# Patient Record
Sex: Female | Born: 1935 | Race: White | Hispanic: No | Marital: Single | Smoking: Never smoker
Health system: Southern US, Community
[De-identification: ages and names within clinical notes are randomized; demographics above are authoritative.]

## PROBLEM LIST (undated history)

## (undated) DIAGNOSIS — I82409 Acute embolism and thrombosis of unspecified deep veins of unspecified lower extremity: Secondary | ICD-10-CM

## (undated) DIAGNOSIS — I2699 Other pulmonary embolism without acute cor pulmonale: Secondary | ICD-10-CM

## (undated) DIAGNOSIS — J45909 Unspecified asthma, uncomplicated: Secondary | ICD-10-CM

## (undated) DIAGNOSIS — K769 Liver disease, unspecified: Secondary | ICD-10-CM

## (undated) DIAGNOSIS — R011 Cardiac murmur, unspecified: Secondary | ICD-10-CM

## (undated) DIAGNOSIS — C679 Malignant neoplasm of bladder, unspecified: Secondary | ICD-10-CM

## (undated) DIAGNOSIS — I10 Essential (primary) hypertension: Secondary | ICD-10-CM

## (undated) DIAGNOSIS — C3490 Malignant neoplasm of unspecified part of unspecified bronchus or lung: Secondary | ICD-10-CM

## (undated) DIAGNOSIS — N289 Disorder of kidney and ureter, unspecified: Secondary | ICD-10-CM

## (undated) DIAGNOSIS — N809 Endometriosis, unspecified: Secondary | ICD-10-CM

## (undated) DIAGNOSIS — K529 Noninfective gastroenteritis and colitis, unspecified: Secondary | ICD-10-CM

## (undated) HISTORY — DX: Cardiac murmur, unspecified: R01.1

## (undated) HISTORY — DX: Liver disease, unspecified: K76.9

## (undated) HISTORY — DX: Endometriosis, unspecified: N80.9

## (undated) HISTORY — DX: Noninfective gastroenteritis and colitis, unspecified: K52.9

## (undated) HISTORY — DX: Disorder of kidney and ureter, unspecified: N28.9

## (undated) HISTORY — PX: SMALL INTESTINE SURGERY: SHX150

## (undated) HISTORY — PX: OTHER SURGICAL HISTORY: SHX169

## (undated) HISTORY — PX: LUNG REMOVAL, PARTIAL: SHX233

## (undated) HISTORY — DX: Essential (primary) hypertension: I10

## (undated) HISTORY — DX: Malignant neoplasm of bladder, unspecified: C67.9

## (undated) HISTORY — DX: Malignant neoplasm of unspecified part of unspecified bronchus or lung: C34.90

## (undated) HISTORY — PX: ABDOMINAL HYSTERECTOMY: SHX81

## (undated) HISTORY — DX: Unspecified asthma, uncomplicated: J45.909

## (undated) HISTORY — DX: Other pulmonary embolism without acute cor pulmonale: I26.99

## (undated) HISTORY — DX: Acute embolism and thrombosis of unspecified deep veins of unspecified lower extremity: I82.409

---

## 2004-03-08 ENCOUNTER — Ambulatory Visit: Payer: Self-pay | Admitting: Internal Medicine

## 2004-03-13 ENCOUNTER — Ambulatory Visit: Payer: Self-pay | Admitting: Internal Medicine

## 2005-05-06 ENCOUNTER — Ambulatory Visit: Payer: Self-pay

## 2005-07-02 ENCOUNTER — Ambulatory Visit: Payer: Self-pay | Admitting: Internal Medicine

## 2005-08-29 ENCOUNTER — Emergency Department: Payer: Self-pay | Admitting: Emergency Medicine

## 2005-10-06 ENCOUNTER — Emergency Department: Payer: Self-pay | Admitting: Emergency Medicine

## 2006-02-28 ENCOUNTER — Ambulatory Visit: Payer: Self-pay | Admitting: Internal Medicine

## 2006-06-27 ENCOUNTER — Ambulatory Visit: Payer: Self-pay

## 2006-07-08 ENCOUNTER — Ambulatory Visit: Payer: Self-pay | Admitting: Internal Medicine

## 2006-08-01 ENCOUNTER — Emergency Department: Payer: Self-pay | Admitting: Emergency Medicine

## 2006-08-12 ENCOUNTER — Encounter: Admission: RE | Admit: 2006-08-12 | Discharge: 2006-08-12 | Payer: Self-pay | Admitting: Unknown Physician Specialty

## 2006-09-03 ENCOUNTER — Ambulatory Visit: Payer: Self-pay | Admitting: Otolaryngology

## 2007-07-10 ENCOUNTER — Ambulatory Visit: Payer: Self-pay | Admitting: Internal Medicine

## 2008-07-08 ENCOUNTER — Ambulatory Visit: Payer: Self-pay | Admitting: Physician Assistant

## 2008-07-11 ENCOUNTER — Ambulatory Visit: Payer: Self-pay | Admitting: Internal Medicine

## 2009-07-31 ENCOUNTER — Ambulatory Visit: Payer: Self-pay | Admitting: Internal Medicine

## 2010-01-18 ENCOUNTER — Ambulatory Visit: Payer: Self-pay | Admitting: Otolaryngology

## 2010-08-02 ENCOUNTER — Ambulatory Visit: Payer: Self-pay | Admitting: Internal Medicine

## 2010-11-22 ENCOUNTER — Encounter: Payer: Self-pay | Admitting: Podiatry

## 2010-12-10 ENCOUNTER — Encounter: Payer: Self-pay | Admitting: Podiatry

## 2011-01-10 ENCOUNTER — Encounter: Payer: Self-pay | Admitting: Podiatry

## 2011-02-09 ENCOUNTER — Encounter: Payer: Self-pay | Admitting: Podiatry

## 2011-08-05 ENCOUNTER — Ambulatory Visit: Payer: Self-pay | Admitting: Internal Medicine

## 2011-10-10 ENCOUNTER — Encounter: Payer: Self-pay | Admitting: Podiatry

## 2012-08-05 ENCOUNTER — Ambulatory Visit: Payer: Self-pay | Admitting: Internal Medicine

## 2012-11-11 ENCOUNTER — Ambulatory Visit: Payer: Self-pay | Admitting: Internal Medicine

## 2013-08-19 ENCOUNTER — Other Ambulatory Visit: Payer: Self-pay | Admitting: Neurology

## 2013-08-19 DIAGNOSIS — R51 Headache: Principal | ICD-10-CM

## 2013-08-19 DIAGNOSIS — R519 Headache, unspecified: Secondary | ICD-10-CM

## 2013-08-23 ENCOUNTER — Ambulatory Visit
Admission: RE | Admit: 2013-08-23 | Discharge: 2013-08-23 | Disposition: A | Discharge status: Home / Self Care | Payer: Medicare Other | Source: Ambulatory Visit | Attending: Neurology | Admitting: Neurology

## 2013-08-23 DIAGNOSIS — R519 Headache, unspecified: Secondary | ICD-10-CM

## 2013-08-23 DIAGNOSIS — R51 Headache: Principal | ICD-10-CM

## 2014-02-17 ENCOUNTER — Ambulatory Visit: Payer: Self-pay | Admitting: Otolaryngology

## 2015-01-09 ENCOUNTER — Encounter: Payer: Self-pay | Admitting: Urology

## 2015-01-09 ENCOUNTER — Ambulatory Visit (INDEPENDENT_AMBULATORY_CARE_PROVIDER_SITE_OTHER): Payer: Medicare Other | Admitting: Urology

## 2015-01-09 VITALS — BP 130/75 | HR 69 | Ht 61.0 in | Wt 193.4 lb

## 2015-01-09 DIAGNOSIS — N390 Urinary tract infection, site not specified: Secondary | ICD-10-CM | POA: Diagnosis not present

## 2015-01-09 DIAGNOSIS — N952 Postmenopausal atrophic vaginitis: Secondary | ICD-10-CM | POA: Diagnosis not present

## 2015-01-09 LAB — URINALYSIS, COMPLETE
BILIRUBIN UA: NEGATIVE
Glucose, UA: NEGATIVE
Ketones, UA: NEGATIVE
Nitrite, UA: POSITIVE — AB
PH UA: 6 (ref 5.0–7.5)
PROTEIN UA: NEGATIVE
SPEC GRAV UA: 1.015 (ref 1.005–1.030)
Urobilinogen, Ur: 0.2 mg/dL (ref 0.2–1.0)

## 2015-01-09 LAB — MICROSCOPIC EXAMINATION: Renal Epithel, UA: NONE SEEN /hpf

## 2015-01-09 LAB — BLADDER SCAN AMB NON-IMAGING: Scan Result: 13

## 2015-01-09 NOTE — Progress Notes (Signed)
01/09/2015 2:46 PM   Jane Perez 1931-02-07 [id number]  Referring provider: No referring provider defined for this encounter.  Chief Complaint  Patient presents with  . Recurrent UTI    referred by Dr. Vickii Penna GI Dr from [place] old patient of Madelin Headings    HPI: Patient is an 81 year old white female with history of recurrent urinary tract infections who presents today referred by her GI physician, Dr. Vickii Penna.    Her baseline urinary symptoms are nocturia.  She has chronic diarrhea and a history of C. Diff infections as well.  I have reviewed Dr. Hillary Bow records and found one urine culture from 10/17/2014 which noted mixed urogenital flora.    Her UTI symptoms are a foul smell to her urine.  She is not experiencing dysuria, cognitive changes, fevers or chills, nausea or vomiting.  She has not had any gross hematuria.    She has a history of bladder cancer diagnosed in the 46's.  I do not know what the pathology of the tumor.  She has several urine cytologies over the years, which have been negative for malignancies.    Her UA was nitrate positive, with RBC's, bacteria and WBC's.  She states she has good fluid intake, but she is allergic to cranberry juice.     PMH: Past Medical History  Diagnosis Date  . Bladder cancer ([place])   . Kidney disease   . Endometriosis   . Colitis   . Asthma   . DVT (deep venous thrombosis) ([place])   . Pulmonary thrombosis ([place])   . Heart murmur   . HTN (hypertension)   . Liver disease     Surgical History: Past Surgical History  Procedure Laterality Date  . Abdominal hysterectomy    . Endometrial surgery    . Small intestine surgery      x 2  . Lung removal, partial Right   . Bladder cancer surgery      Home Medications:    Medication List       This list is accurate as of: 01/09/15  2:46 PM.  Always use your most recent med list.               ALIGN 4 MG Caps  Take by mouth.     atenolol 25 MG tablet  Commonly known as:   TENORMIN  TAKE ONE TABLET EVERY DAY     clotrimazole-betamethasone cream  Commonly known as:  LOTRISONE  Apply topically.     fluconazole 100 MG tablet  Commonly known as:  DIFLUCAN     MULTI-VITAMINS Tabs  Take by mouth.     nitrofurantoin (macrocrystal-monohydrate) 100 MG capsule  Commonly known as:  MACROBID     RA VITAMIN B-12 TR 1000 MCG Tbcr  Generic drug:  Cyanocobalamin  Take by mouth.     vitamin C 1000 MG tablet  Take by mouth.     vitamin E 1000 UNIT capsule  Take by mouth.        Allergies:  Allergies  Allergen Reactions  . Aspirin Other (See Comments)    Will cause hemerage   . Ciprofloxacin Hives  . Codeine Other (See Comments)    Other Reaction: GI Upset  . Metronidazole Hives  . Penicillins Shortness Of Breath  . Sulfa Antibiotics Shortness Of Breath  . Clindamycin Hcl Other (See Comments)  . Nitrofurantoin Monohyd Macro Other (See Comments) and Nausea Only  . Other Other (See Comments)    Uncoded Allergy. Allergen: flu  shot, Other Reaction: Other reaction  . Povidone Iodine Other (See Comments)    Other Reaction: Other reaction    Family History: No family history on file.  Social History:  reports that she has never smoked. She does not have any smokeless tobacco history on file. She reports that she does not drink alcohol or use illicit drugs.  ROS: UROLOGY Frequent Urination?: No Hard to postpone urination?: No Burning/pain with urination?: No Get up at night to urinate?: Yes Leakage of urine?: No Urine stream starts and stops?: No Trouble starting stream?: No Do you have to strain to urinate?: No Blood in urine?: No Urinary tract infection?: Yes Sexually transmitted disease?: No Injury to kidneys or bladder?: No Painful intercourse?: No Weak stream?: No Currently pregnant?: No Vaginal bleeding?: No Last menstrual period?: n  Gastrointestinal Nausea?: No Vomiting?: No Indigestion/heartburn?: No Diarrhea?:  Yes Constipation?: No  Constitutional Fever: No Night sweats?: No Weight loss?: No Fatigue?: No  Skin Skin rash/lesions?: No Itching?: No  Eyes Blurred vision?: No Double vision?: No  Ears/Nose/Throat Sore throat?: No Sinus problems?: No  Hematologic/Lymphatic Swollen glands?: Yes Easy bruising?: Yes  Cardiovascular Leg swelling?: No Chest pain?: No  Respiratory Cough?: No Shortness of breath?: No  Endocrine Excessive thirst?: No  Musculoskeletal Back pain?: No Joint pain?: No  Neurological Headaches?: No Dizziness?: No  Psychologic Depression?: No Anxiety?: No  Physical Exam: BP 130/75 mmHg  Pulse 69  Ht '5\' 1"'$  (1.549 m)  Wt 193 lb 6.4 oz (87.726 kg)  BMI 36.56 kg/m2  Constitutional:  Alert and oriented, No acute distress. HEENT: [place] AT, moist mucus membranes.  Trachea midline, no masses. Cardiovascular: No clubbing, cyanosis, or edema. Respiratory: Normal respiratory effort, no increased work of breathing. GI: Abdomen is soft, non tender, non distended, no abdominal masses GU: No CVA tenderness. Patient deferred exam today because she did not have a bath.   Skin: No rashes, bruises or suspicious lesions. Lymph: No cervical or inguinal adenopathy. Neurologic: Grossly intact, no focal deficits, moving all 4 extremities. Psychiatric: Normal mood and affect.  Laboratory Data:  Urinalysis Results for orders placed or performed in visit on 01/09/15  Microscopic Examination  Result Value Ref Range   WBC, UA 11-30 (A) 0 -  5 /hpf   RBC, UA 3-10 (A) 0 -  2 /hpf   Epithelial Cells (non renal) 0-10 0 - 10 /hpf   Renal Epithel, UA None seen None seen /hpf   Bacteria, UA Many (A) None seen/Few  Urinalysis, Complete  Result Value Ref Range   Specific Gravity, UA 1.015 1.005 - 1.030   pH, UA 6.0 5.0 - 7.5   Color, UA Yellow Yellow   Appearance Ur Clear Clear   Leukocytes, UA 2+ (A) Negative   Protein, UA Negative Negative/Trace   Glucose, UA  Negative Negative   Ketones, UA Negative Negative   RBC, UA 1+ (A) Negative   Bilirubin, UA Negative Negative   Urobilinogen, Ur 0.2 0.2 - 1.0 mg/dL   Nitrite, UA Positive (A) Negative   Microscopic Examination See below:   BLADDER SCAN AMB NON-IMAGING  Result Value Ref Range   Scan Result 13    Pertinent Imaging: Results for DARIENNE, BELLEAU (MRN [id number]) as of 01/09/2015 14:24  Ref. Range 01/09/2015 14:19  Scan Result Unknown 13    Assessment & Plan:    1. Recurrent UTI:    Patient most likely is colonized.  I will send the urine for culture, but I will hold  antibiotics for now.  If she should develop symptoms, such as: fevers, gross hematuria, mental status changes, etc., we will treat.  She is incontinent of stool, so this predisposes her to UTI's.    - Urinalysis, Complete - BLADDER SCAN AMB NON-IMAGING  2. Atrophic vaginitis:   Patient most likely has atrophic vaginitis.  She would not let me examine her today, as she did not take a bath.  She will allow an exam in three months when she RTC.  Patient was given a sample of vaginal estrogen cream and instructed to apply 0.'5mg'$  (pea-sized amount)  just inside the vaginal introitus with a finger-tip every night for two weeks and then Monday, Wednesday and Friday nights.  I explained to the patient that vaginally administered estrogen, which causes only a slight increase in the blood estrogen levels, have fewer contraindications and adverse systemic effects that oral HT.  Return in about 3 months (around 04/11/2015) for exam .  Zara Council, [facility]  [facility] [address], [place], [place] [address] [phone]

## 2015-04-11 ENCOUNTER — Encounter: Payer: Self-pay | Admitting: Urology

## 2015-04-11 ENCOUNTER — Ambulatory Visit (INDEPENDENT_AMBULATORY_CARE_PROVIDER_SITE_OTHER): Payer: Medicare Other | Admitting: Urology

## 2015-04-11 VITALS — BP 144/76 | HR 81 | Ht 62.0 in | Wt 196.5 lb

## 2015-04-11 DIAGNOSIS — N390 Urinary tract infection, site not specified: Secondary | ICD-10-CM

## 2015-04-11 DIAGNOSIS — Z8551 Personal history of malignant neoplasm of bladder: Secondary | ICD-10-CM

## 2015-04-11 DIAGNOSIS — N952 Postmenopausal atrophic vaginitis: Secondary | ICD-10-CM

## 2015-04-11 LAB — URINALYSIS, COMPLETE
BILIRUBIN UA: NEGATIVE
Glucose, UA: NEGATIVE
KETONES UA: NEGATIVE
Nitrite, UA: NEGATIVE
PH UA: 6.5 (ref 5.0–7.5)
PROTEIN UA: NEGATIVE
RBC UA: NEGATIVE
SPEC GRAV UA: 1.01 (ref 1.005–1.030)
Urobilinogen, Ur: 0.2 mg/dL (ref 0.2–1.0)

## 2015-04-11 LAB — MICROSCOPIC EXAMINATION
BACTERIA UA: NONE SEEN
EPITHELIAL CELLS (NON RENAL): NONE SEEN /HPF (ref 0–10)
RBC, UA: NONE SEEN /hpf (ref 0–?)

## 2015-04-11 NOTE — Progress Notes (Signed)
1:42 PM   Jane Perez 1930/03/18 [id number]  Referring provider: Tracie Harrier, MD [address] [facility] [place], [place] [address]  Chief Complaint  Patient presents with  . Vaginitis    follow up exam    HPI: Patient is an 82 year old white female with history of recurrent urinary tract infections who presents today for an exam and to discuss her vaginal estrogen cream use.    Previous history Her baseline urinary symptoms are nocturia.  She has chronic diarrhea and a history of C. Diff infections as well.  I have reviewed Dr. Hillary Bow records and found one urine culture from 10/17/2014 which noted mixed urogenital flora.  Her UTI symptoms are a foul smell to her urine.  She is not experiencing dysuria, cognitive changes, fevers or chills, nausea or vomiting.  She has not had any gross hematuria.   She has a history of bladder cancer diagnosed in the 26's.  I do not know what the pathology of the tumor.  She has several urine cytologies over the years, which have been negative for malignancies.    She states that she did not use the cream and is not having any problems at this time.  Her UA was unremarkable on today's exam.  She states she has good fluid intake, but she is allergic to cranberry juice.  She is refusing her exam at today's visit.    She denies any gross hematuria, dysuria or suprapubic pain. She is not any recent fevers, chills, nausea or vomiting.   PMH: Past Medical History  Diagnosis Date  . Bladder cancer ([place])   . Kidney disease   . Endometriosis   . Colitis   . Asthma   . DVT (deep venous thrombosis) ([place])   . Pulmonary thrombosis ([place])   . Heart murmur   . HTN (hypertension)   . Liver disease   . Lung cancer [facility])     Surgical History: Past Surgical History  Procedure Laterality Date  . Abdominal hysterectomy    . Endometrial surgery    . Small intestine surgery      x 2  . Lung removal, partial Right   . Bladder  cancer surgery      Home Medications:    Medication List       This list is accurate as of: 04/11/15  1:42 PM.  Always use your most recent med list.               ALIGN 4 MG Caps  Take by mouth.     atenolol 25 MG tablet  Commonly known as:  TENORMIN  TAKE ONE TABLET EVERY DAY     clotrimazole-betamethasone cream  Commonly known as:  LOTRISONE  Apply topically.     fluconazole 100 MG tablet  Commonly known as:  DIFLUCAN  Reported on 04/11/2015     ipratropium-albuterol 0.5-2.5 (3) MG/3ML Soln  Commonly known as:  DUONEB  Inhale into the lungs. Reported on 04/11/2015     MULTI-VITAMINS Tabs  Take by mouth.     nitrofurantoin (macrocrystal-monohydrate) 100 MG capsule  Commonly known as:  MACROBID  Reported on 04/11/2015     RA VITAMIN B-12 TR 1000 MCG Tbcr  Generic drug:  Cyanocobalamin  Take by mouth.     vitamin C 1000 MG tablet  Take by mouth.     vitamin E 1000 UNIT capsule  Take by mouth.        Allergies:  Allergies  Allergen Reactions  . Aspirin Other (See Comments)    Will cause hemerage   . Ciprofloxacin Hives  . Codeine Other (See Comments)    Other Reaction: GI Upset  . Metronidazole Hives  . Penicillins Shortness Of Breath  . Sulfa Antibiotics Shortness Of Breath  . Clindamycin Hcl Other (See Comments)  . Nitrofurantoin Monohyd Macro Other (See Comments) and Nausea Only  . Other Other (See Comments)    Uncoded Allergy. Allergen: flu shot, Other Reaction: Other reaction  . Povidone Iodine Other (See Comments)    Other Reaction: Other reaction    Family History: Family History  Problem Relation Age of Onset  . Kidney disease Brother   . Bladder Cancer Neg Hx   . Kidney disease Mother   . Lung cancer Father   . Colon cancer Mother   . Hypertension      multiple family members    Social History:  reports that she has never smoked. She does not have any smokeless tobacco history on file. She reports that she does not drink alcohol  or use illicit drugs.  ROS: UROLOGY Frequent Urination?: No Hard to postpone urination?: No Burning/pain with urination?: No Get up at night to urinate?: No Leakage of urine?: No Urine stream starts and stops?: No Trouble starting stream?: No Do you have to strain to urinate?: No Blood in urine?: No Urinary tract infection?: No Sexually transmitted disease?: No Injury to kidneys or bladder?: No Painful intercourse?: No Weak stream?: No Currently pregnant?: No Vaginal bleeding?: No Last menstrual period?: n  Gastrointestinal Nausea?: No Vomiting?: No Indigestion/heartburn?: No Diarrhea?: No Constipation?: No  Constitutional Fever: No Night sweats?: No Weight loss?: No Fatigue?: No  Skin Skin rash/lesions?: No Itching?: No  Eyes Blurred vision?: No Double vision?: No  Ears/Nose/Throat Sore throat?: No Sinus problems?: No  Hematologic/Lymphatic Swollen glands?: No Easy bruising?: No  Cardiovascular Leg swelling?: No Chest pain?: No  Respiratory Cough?: No Shortness of breath?: No  Endocrine Excessive thirst?: No  Musculoskeletal Back pain?: No Joint pain?: No  Neurological Headaches?: No Dizziness?: No  Psychologic Depression?: No Anxiety?: No  Physical Exam: BP 144/76 mmHg  Pulse 81  Ht '5\' 2"'$  (1.575 m)  Wt 196 lb 8 oz (89.132 kg)  BMI 35.93 kg/m2  Constitutional:  Alert and oriented, No acute distress. HEENT: [place] AT, moist mucus membranes.  Trachea midline, no masses. Cardiovascular: No clubbing, cyanosis, or edema. Respiratory: Normal respiratory effort, no increased work of breathing. GI: Abdomen is soft, non tender, non distended, no abdominal masses GU: No CVA tenderness. Patient deferred exam today because she did not have a bath.   Skin: No rashes, bruises or suspicious lesions. Lymph: No cervical or inguinal adenopathy. Neurologic: Grossly intact, no focal deficits, moving all 4 extremities. Psychiatric: Normal mood and  affect.  Laboratory Data:  Urinalysis Results for orders placed or performed in visit on 04/11/15  Microscopic Examination  Result Value Ref Range   WBC, UA 0-5 0 -  5 /hpf   RBC, UA None seen 0 -  2 /hpf   Epithelial Cells (non renal) None seen 0 - 10 /hpf   Bacteria, UA None seen None seen/Few  Urinalysis, Complete  Result Value Ref Range   Specific Gravity, UA 1.010 1.005 - 1.030   pH, UA 6.5 5.0 - 7.5   Color, UA Yellow Yellow   Appearance Ur Clear Clear   Leukocytes, UA Trace (A) Negative   Protein, UA Negative Negative/Trace   Glucose, UA  Negative Negative   Ketones, UA Negative Negative   RBC, UA Negative Negative   Bilirubin, UA Negative Negative   Urobilinogen, Ur 0.2 0.2 - 1.0 mg/dL   Nitrite, UA Negative Negative   Microscopic Examination See below:       Assessment & Plan:    1. Recurrent UTI:    Patient is not having urinary symptoms at this time. Her UA today is unremarkable.  - Urinalysis, Complete   2. Atrophic vaginitis:   Patient did not use the vaginal estrogen cream and she felt she did not have any problems.  She refused exam today.  3. Remote history of bladder cancer:   Patient states that she had bladder cancer in the 1980's. She had microscopic hematuria at her last visit, but she states that she was having difficulty with hemorrhoids and diarrhea. She would not allow a exam at that time so I was uncertain where the blood in the urine would be coming from. On today's urinalysis,  no blood is seen.  She will follow-up in one year for urinalysis. She will contact the office if she develops any gross hematuria.   Return for pending UA.  Zara Council, [facility] [address], [place], [place] [address] [phone]

## 2015-04-12 ENCOUNTER — Telehealth: Payer: Self-pay | Admitting: Urology

## 2015-04-12 DIAGNOSIS — Z8551 Personal history of malignant neoplasm of bladder: Secondary | ICD-10-CM | POA: Insufficient documentation

## 2015-04-12 NOTE — Telephone Encounter (Signed)
Patient did not have blood in her urine.  We need to see her in one year.

## 2015-04-12 NOTE — Telephone Encounter (Signed)
Spoke with pt in reference to hematuria. Pt voiced understanding.

## 2020-02-17 ENCOUNTER — Telehealth: Payer: Self-pay | Admitting: Adult Health Nurse Practitioner

## 2020-02-17 NOTE — Telephone Encounter (Signed)
Spoke with patient regarding Palliative referral and have scheduled an In-home Consult for 02/29/20 @ 10 AM.

## 2020-02-29 ENCOUNTER — Telehealth: Payer: Self-pay | Admitting: Adult Health Nurse Practitioner

## 2020-02-29 ENCOUNTER — Other Ambulatory Visit: Payer: Self-pay

## 2020-02-29 ENCOUNTER — Other Ambulatory Visit: Payer: Medicare Other | Admitting: Adult Health Nurse Practitioner

## 2020-02-29 NOTE — Telephone Encounter (Signed)
Spoke with patient to see if she wanted to reschedule the Palliative Consult that was scheduled for today at 10.  Patient stated that she forgot about the appointment and wasn't home.  We have rescheduled the visit for 03/08/20 @ 12:30 PM.

## 2020-02-29 NOTE — Telephone Encounter (Signed)
Arrived at patient's home for visit.  No answer at door and no answer when called on phone.  Unable to leave message.  Attempted to call son with no answer and unable to leave message.  Reached to scheduler to let her know. Calleigh Lafontant K. Olena Heckle NP

## 2020-03-08 ENCOUNTER — Other Ambulatory Visit: Payer: Medicare Other | Admitting: Adult Health Nurse Practitioner

## 2020-03-22 ENCOUNTER — Other Ambulatory Visit: Payer: Medicare Other | Admitting: Adult Health Nurse Practitioner

## 2020-03-22 ENCOUNTER — Other Ambulatory Visit: Payer: Self-pay

## 2020-03-22 DIAGNOSIS — R5381 Other malaise: Secondary | ICD-10-CM

## 2020-03-22 DIAGNOSIS — G629 Polyneuropathy, unspecified: Secondary | ICD-10-CM

## 2020-03-22 DIAGNOSIS — Z515 Encounter for palliative care: Secondary | ICD-10-CM

## 2020-03-22 DIAGNOSIS — G43909 Migraine, unspecified, not intractable, without status migrainosus: Secondary | ICD-10-CM

## 2020-03-22 DIAGNOSIS — I10 Essential (primary) hypertension: Secondary | ICD-10-CM

## 2020-03-22 NOTE — Progress Notes (Signed)
[facility] Palliative Care Consult Note Telephone: [phone]  Fax: [phone]  PATIENT NAME: BLIMA JAIMES DOB: Mar 01, 1931 MRN: [id number]  PRIMARY CARE PROVIDER:   Tracie Harrier, MD  REFERRING PROVIDER:  Tracie Harrier, [place] [facility] [place],  [place] [address]  RESPONSIBLE PARTY:   Self, [phone] Loletta Specter, son [phone]  Chief complaint:  Initial palliative visit/debility  Called patient prior to visit to confirm visit and COVID screening done.    RECOMMENDATIONS and PLAN:  1.  Advanced care planning.  Patient full code.  She does voice that she does not want to be on life support.  States that she needs to look over and fill out HCPOA.  Left advanced directives and MOST form for her to review.  Will go over further at a future visits  2. Neuropathy.  Patient gets neuropathic pain in legs that is worse at night.  She has been seen by neurology and prescribed gabapentin, which she has not tried.  Have encouraged her to try this.   3.  Hypertension.  This controlled with atenolol 25 mg daily.  4.  Migraines.  When she does get a migraine she is able to control the pain with rest, warm cloth, and extra strength Tylenol. Continue current relief measures.   5.  Debility.  Patient is having more weakness in her legs.  Does get help with aids that come in to help 2 days a week.  PCP trying to get appointment with her for [facility] face to face but she has kept cancelling due to various reasons.  Will continue to encourage to keep appointment with PCP   Palliative will continue to monitor for symptom management/decline and make recommendations as needed.  Follow up in 6 weeks. Encouraged to call with any questions or concerns.  I spent 80 minutes providing this consultation. More than 50% of the time in this consultation was spent coordinating communication  HISTORY OF PRESENT ILLNESS:  TIMICA MARCOM  is a 87 y.o. year old female with multiple medical problems including OA, asthma, CKD, colitis, migraines, h/o lung cancer with surgical resection, h/o bladder cancer requiring surgery. Palliative Care was asked to help address goals of care. Patient lives alone.  Does not see family much but has friends and church family that visit and help her with rides, getting groceries, and sometimes financially. States that she quit driving about 2 years ago.  She states that she has been slower and uses her walker more often to prevent falls.  Does state having a history of falls but does not report any recent falls.  States that some days are better than other days and tries to rest more on days she feels weaker or has a migraine.  States that she gets a migraine headache about 1-2 times per month, sometimes less often.  The migraine pain is mostly in the right side of her head behind her right eye.  She gets light sensitivity with the migraines.  The migraine will last all day and is relieved with rest, extra strength Tylenol, and warm compresses. She states having a blood clot behind right knee.  States that it itches but does not cause any pain.  States that she has had this for over 8 years.  She wears support hose but is unable to pull on TED hose due to shoulder pain.  She is able to perform ADLs independently.  She can do most IADLs herself but  does need help with making the bed, vacuuming, and with someone to help with getting her groceries.  Rest of 10 ROS asked and negative.  CODE STATUS: see above  PPS: 60% HOSPICE ELIGIBILITY/DIAGNOSIS: TBD  PHYSICAL EXAM:  BP 124/60  HR 62 O2 97% on RA General: NAD, frail appearing Eyes: sclera anicteric and noninjected with no discharge noted ENMT: moist mucous membranes Cardiovascular: regular rate and rhythm Pulmonary: lung sounds clear; normal respiratory effort Abdomen: soft, nontender, + bowel sounds Extremities: no edema, no joint deformities Skin: no  rashes on exposed skin Neurological: Weakness but otherwise nonfocal  Family History:  Family History  Problem Relation Age of Onset  . Colon cancer Mother  . Lung cancer Father  . Lung cancer Brother  . Blindness Neg Hx  . Diabetes type II Neg Hx  . Glaucoma Neg Hx  . High blood pressure (Hypertension) Neg Hx  . Macular degeneration Neg Hx  . Anesthesia problems Neg Hx  . Malignant hypertension Neg Hx      PAST MEDICAL HISTORY:  Past Medical History:  Diagnosis Date  . Asthma   . Bladder cancer ([place])   . Colitis   . DVT (deep venous thrombosis) ([place])   . Endometriosis   . Heart murmur   . HTN (hypertension)   . Kidney disease   . Liver disease   . Lung cancer ([place])   . Pulmonary thrombosis ([facility])     SOCIAL HX:  Social History   Tobacco Use  . Smoking status: Never Smoker  . Smokeless tobacco: Not on file  Substance Use Topics  . Alcohol use: No    Alcohol/week: 0.0 standard drinks    ALLERGIES:  Allergies  Allergen Reactions  . Aspirin Other (See Comments)    Will cause hemerage   . Ciprofloxacin Hives  . Codeine Other (See Comments)    Other Reaction: GI Upset  . Metronidazole Hives  . Penicillins Shortness Of Breath  . Sulfa Antibiotics Shortness Of Breath  . Clindamycin Hcl Other (See Comments)  . Nitrofurantoin Monohyd Macro Other (See Comments) and Nausea Only  . Other Other (See Comments)    Uncoded Allergy. Allergen: flu shot, Other Reaction: Other reaction  . Povidone Iodine Other (See Comments)    Other Reaction: Other reaction     PERTINENT MEDICATIONS:  Outpatient Encounter Medications as of 03/22/2020  Medication Sig  . Ascorbic Acid (VITAMIN C) 1000 MG tablet Take by mouth.  Marland Kitchen atenolol (TENORMIN) 25 MG tablet TAKE ONE TABLET EVERY DAY  . Cyanocobalamin (RA VITAMIN B-12 TR) 1000 MCG TBCR Take by mouth.  . fluconazole (DIFLUCAN) 100 MG tablet Reported on 04/11/2015  . ipratropium-albuterol (DUONEB) 0.5-2.5 (3) MG/3ML SOLN Inhale into  the lungs. Reported on 04/11/2015  . Multiple Vitamin (MULTI-VITAMINS) TABS Take by mouth.  . nitrofurantoin, macrocrystal-monohydrate, (MACROBID) 100 MG capsule Reported on 04/11/2015  . Probiotic Product (ALIGN) 4 MG CAPS Take by mouth.  . vitamin E 1000 UNIT capsule Take by mouth.   No facility-administered encounter medications on file as of 03/22/2020.     Cortnee Steinmiller Jenetta Downer, NP

## 2020-04-06 ENCOUNTER — Telehealth: Payer: Self-pay | Admitting: Adult Health Nurse Practitioner

## 2020-04-06 NOTE — Telephone Encounter (Signed)
Returned patient's VM.  Patient states not feeling well but has no specific complaints.  States just needing to talk with someone.  Have reached out to RN navigator to see if we have anyone who could give her periodic phone calls to have someone for her talk with as she is feeling very isolated during the pandemic and she is not able to have the vaccine due to her health issues.  Ashritha Desrosiers K. Olena Heckle NP

## 2020-05-02 ENCOUNTER — Other Ambulatory Visit: Payer: Medicare Other | Admitting: Adult Health Nurse Practitioner

## 2020-05-02 ENCOUNTER — Telehealth: Payer: Self-pay

## 2020-05-02 ENCOUNTER — Other Ambulatory Visit: Payer: Self-pay

## 2020-05-02 DIAGNOSIS — R5381 Other malaise: Secondary | ICD-10-CM

## 2020-05-02 DIAGNOSIS — I825Z1 Chronic embolism and thrombosis of unspecified deep veins of right distal lower extremity: Secondary | ICD-10-CM

## 2020-05-02 DIAGNOSIS — G43909 Migraine, unspecified, not intractable, without status migrainosus: Secondary | ICD-10-CM

## 2020-05-02 DIAGNOSIS — G629 Polyneuropathy, unspecified: Secondary | ICD-10-CM

## 2020-05-02 DIAGNOSIS — Z515 Encounter for palliative care: Secondary | ICD-10-CM

## 2020-05-02 NOTE — Progress Notes (Signed)
[place] Consult Note Telephone: [phone]  Fax: [phone]  PATIENT NAME: Jane Perez DOB: 11-06-1930 MRN: [id number]  PRIMARY CARE PROVIDER:   Tracie Harrier, MD  REFERRING PROVIDER:  Tracie Harrier, [place] [facility] [place],  [place] [address]  RESPONSIBLE PARTY:   Self, [phone] Loletta Specter, son [phone]  Chief complaint:  Initial palliative visit/debility  RECOMMENDATIONS and PLAN:  1.  Advanced care planning.  Patient is full code  2.  Neuropathy.  Patient started using her gabapentin and is getting relief with this.  Continue gabapentin at current dose and follow-up and recommendations with neurology.  3.  Migraines.  Patient gets occasional migraines which are debilitating.  Does get relief with rest and extra strength Tylenol.  Continue current relief measures  4.  Debility.  Patient able to get around home with walker and holding onto walls/furniture.  She denies shortness of breath but today she does have shortness of breath when walking from kitchen to the living room which is only about 20 to 30 feet.  We will fill out form for PCS services through [facility] and fax to them to see if patient will qualify for [facility] services.  Patient having increased weakness, DOE, forgetfulness and could use extra help in the home. Palliative continue to monitor for symptom management/decline and make recommendations as needed.  Follow-up in 6 weeks.  Encouraged to call with any questions or concerns  I spent 60 minutes providing this consultation, including time with patient, provider coordination, chart review, documentation. More than 50% of the time in this consultation was spent coordinating communication.   HISTORY OF PRESENT ILLNESS:  Jane Perez is a 87 y.o. year old female with multiple medical problems including OA, asthma, CKD, colitis, migraines, h/o lung cancer with  surgical resection, h/o bladder cancer requiring surgery. Palliative Care was asked to help address goals of care.  Patient continues to endorse weakness.  She is able to shower independently but does state that some days she just takes a sponge bath because of weakness.  Patient states her migraines do occur 1-2 times per month and she continues to get relief with rest, extra strength Tylenol and warm compresses.  Patient states that she is lonely and family does not come to see her much.  She feels as if she is becoming more forgetful.  Does state that she has forgotten once that she turned on the stove top and came back in the kitchen and found the burner red hot.  States that she has been more careful since.  Did discuss that depression and anxiety from her loneliness could cause forgetfulness.  Discussed with patient that I have reached out to her organization for help in getting someone who would just sit and talk with her.  Unable to get anybody at this time.  She is very appreciative of this effort.  CODE STATUS: see above  PPS: 60% HOSPICE ELIGIBILITY/DIAGNOSIS: TBD  PHYSICAL EXAM:  BP 122/74  HR 68 O2 94% on RA General: NAD, frail appearing Eyes: sclera anicteric and noninjected with no discharge noted ENMT: moist mucous membranes Cardiovascular: regular rate and rhythm Pulmonary: lung sounds clear; normal respiratory effort Abdomen: soft, nontender, + bowel sounds Extremities: no edema, no joint deformities Skin: no rashes on exposed skin Neurological: Weakness but otherwise nonfocal; states having forgetfulness  PAST MEDICAL HISTORY:  Past Medical History:  Diagnosis Date  . Asthma   . Bladder cancer ([place])   .  Colitis   . DVT (deep venous thrombosis) ([place])   . Endometriosis   . Heart murmur   . HTN (hypertension)   . Kidney disease   . Liver disease   . Lung cancer ([place])   . Pulmonary thrombosis ([facility])     SOCIAL HX:  Social History   Tobacco Use  . Smoking status:  Never Smoker  . Smokeless tobacco: Not on file  Substance Use Topics  . Alcohol use: No    Alcohol/week: 0.0 standard drinks    ALLERGIES:  Allergies  Allergen Reactions  . Aspirin Other (See Comments)    Will cause hemerage   . Ciprofloxacin Hives  . Codeine Other (See Comments)    Other Reaction: GI Upset  . Metronidazole Hives  . Penicillins Shortness Of Breath  . Sulfa Antibiotics Shortness Of Breath  . Clindamycin Hcl Other (See Comments)  . Nitrofurantoin Monohyd Macro Other (See Comments) and Nausea Only  . Other Other (See Comments)    Uncoded Allergy. Allergen: flu shot, Other Reaction: Other reaction  . Povidone Iodine Other (See Comments)    Other Reaction: Other reaction     PERTINENT MEDICATIONS:  Outpatient Encounter Medications as of 05/02/2020  Medication Sig  . Ascorbic Acid (VITAMIN C) 1000 MG tablet Take by mouth.  Marland Kitchen atenolol (TENORMIN) 25 MG tablet TAKE ONE TABLET EVERY DAY  . Cyanocobalamin (RA VITAMIN B-12 TR) 1000 MCG TBCR Take by mouth.  . fluconazole (DIFLUCAN) 100 MG tablet Reported on 04/11/2015  . ipratropium-albuterol (DUONEB) 0.5-2.5 (3) MG/3ML SOLN Inhale into the lungs. Reported on 04/11/2015  . Multiple Vitamin (MULTI-VITAMINS) TABS Take by mouth.  . nitrofurantoin, macrocrystal-monohydrate, (MACROBID) 100 MG capsule Reported on 04/11/2015  . Probiotic Product (ALIGN) 4 MG CAPS Take by mouth.  . vitamin E 1000 UNIT capsule Take by mouth.   No facility-administered encounter medications on file as of 05/02/2020.     Marquese Burkland Jenetta Downer, NP

## 2020-05-02 NOTE — Telephone Encounter (Signed)
Volunteer called patient on behalf of Palliative Care. Patient is doing well at this time.  

## 2020-06-01 ENCOUNTER — Telehealth: Payer: Self-pay | Admitting: Adult Health Nurse Practitioner

## 2020-06-01 NOTE — Telephone Encounter (Signed)
Returned patient's VM asking for a return to call.  She stated she fell yesterday but did not indicate any injury.  She stated she could not remember why she called.  Advised to call back if she remembered.  Do have upcoming appointment in 2 weeks. Amy K. Olena Heckle NP

## 2020-06-03 ENCOUNTER — Other Ambulatory Visit: Payer: Self-pay

## 2020-06-03 ENCOUNTER — Emergency Department
Admission: EM | Admit: 2020-06-03 | Discharge: 2020-06-03 | Disposition: A | Discharge status: Home / Self Care | Payer: Medicare Other | Attending: Emergency Medicine | Admitting: Emergency Medicine

## 2020-06-03 ENCOUNTER — Emergency Department: Payer: Medicare Other

## 2020-06-03 ENCOUNTER — Encounter: Payer: Self-pay | Admitting: Emergency Medicine

## 2020-06-03 DIAGNOSIS — J45909 Unspecified asthma, uncomplicated: Secondary | ICD-10-CM | POA: Insufficient documentation

## 2020-06-03 DIAGNOSIS — Z8551 Personal history of malignant neoplasm of bladder: Secondary | ICD-10-CM | POA: Insufficient documentation

## 2020-06-03 DIAGNOSIS — Y9301 Activity, walking, marching and hiking: Secondary | ICD-10-CM | POA: Diagnosis not present

## 2020-06-03 DIAGNOSIS — W19XXXA Unspecified fall, initial encounter: Secondary | ICD-10-CM | POA: Diagnosis not present

## 2020-06-03 DIAGNOSIS — Z79899 Other long term (current) drug therapy: Secondary | ICD-10-CM | POA: Diagnosis not present

## 2020-06-03 DIAGNOSIS — S0990XA Unspecified injury of head, initial encounter: Secondary | ICD-10-CM

## 2020-06-03 DIAGNOSIS — S0181XA Laceration without foreign body of other part of head, initial encounter: Secondary | ICD-10-CM

## 2020-06-03 DIAGNOSIS — S01111A Laceration without foreign body of right eyelid and periocular area, initial encounter: Secondary | ICD-10-CM | POA: Insufficient documentation

## 2020-06-03 DIAGNOSIS — Z85118 Personal history of other malignant neoplasm of bronchus and lung: Secondary | ICD-10-CM | POA: Insufficient documentation

## 2020-06-03 DIAGNOSIS — I10 Essential (primary) hypertension: Secondary | ICD-10-CM | POA: Insufficient documentation

## 2020-06-03 DIAGNOSIS — Z23 Encounter for immunization: Secondary | ICD-10-CM | POA: Diagnosis not present

## 2020-06-03 LAB — CBC WITH DIFFERENTIAL/PLATELET
Abs Immature Granulocytes: 0.03 10*3/uL (ref 0.00–0.07)
Basophils Absolute: 0 10*3/uL (ref 0.0–0.1)
Basophils Relative: 1 %
Eosinophils Absolute: 0.2 10*3/uL (ref 0.0–0.5)
Eosinophils Relative: 2 %
HCT: 40.7 % (ref 36.0–46.0)
Hemoglobin: 13.9 g/dL (ref 12.0–15.0)
Immature Granulocytes: 0 %
Lymphocytes Relative: 17 %
Lymphs Abs: 1.3 10*3/uL (ref 0.7–4.0)
MCH: 32 pg (ref 26.0–34.0)
MCHC: 34.2 g/dL (ref 30.0–36.0)
MCV: 93.6 fL (ref 80.0–100.0)
Monocytes Absolute: 0.8 10*3/uL (ref 0.1–1.0)
Monocytes Relative: 10 %
Neutro Abs: 5.2 10*3/uL (ref 1.7–7.7)
Neutrophils Relative %: 70 %
Platelets: 150 10*3/uL (ref 150–400)
RBC: 4.35 MIL/uL (ref 3.87–5.11)
RDW: 12.8 % (ref 11.5–15.5)
WBC: 7.5 10*3/uL (ref 4.0–10.5)
nRBC: 0 % (ref 0.0–0.2)

## 2020-06-03 LAB — BASIC METABOLIC PANEL
Anion gap: 7 (ref 5–15)
BUN: 17 mg/dL (ref 8–23)
CO2: 24 mmol/L (ref 22–32)
Calcium: 9.7 mg/dL (ref 8.9–10.3)
Chloride: 104 mmol/L (ref 98–111)
Creatinine, Ser: 1.18 mg/dL — ABNORMAL HIGH (ref 0.44–1.00)
GFR, Estimated: 44 mL/min — ABNORMAL LOW (ref >60)
Glucose, Bld: 109 mg/dL — ABNORMAL HIGH (ref 70–99)
Potassium: 4.2 mmol/L (ref 3.5–5.1)
Sodium: 135 mmol/L (ref 135–145)

## 2020-06-03 LAB — URINALYSIS, COMPLETE (UACMP) WITH MICROSCOPIC
Bilirubin Urine: NEGATIVE
Glucose, UA: NEGATIVE mg/dL
Hgb urine dipstick: NEGATIVE
Ketones, ur: NEGATIVE mg/dL
Leukocytes,Ua: NEGATIVE
Nitrite: NEGATIVE
Protein, ur: NEGATIVE mg/dL
Specific Gravity, Urine: 1.002 — ABNORMAL LOW (ref 1.005–1.030)
pH: 6 (ref 5.0–8.0)

## 2020-06-03 MED ORDER — LIDOCAINE-EPINEPHRINE (PF) 2 %-1:200000 IJ SOLN: 10.0000 mL | Freq: Once | INTRAMUSCULAR | Status: DC

## 2020-06-03 MED ORDER — LIDOCAINE-EPINEPHRINE 2 %-1:100000 IJ SOLN
20.0000 mL | Freq: Once | INTRAMUSCULAR | Status: AC
  Administered 2020-06-03: 20 mL via INTRADERMAL

## 2020-06-03 MED ORDER — BACITRACIN ZINC 500 UNIT/GM EX OINT
TOPICAL_OINTMENT | Freq: Once | CUTANEOUS | Status: AC
  Filled 2020-06-03: qty 0.9

## 2020-06-03 MED ORDER — TETANUS-DIPHTH-ACELL PERTUSSIS 5-2.5-18.5 LF-MCG/0.5 IM SUSY
0.5000 mL | PREFILLED_SYRINGE | Freq: Once | INTRAMUSCULAR | Status: AC
  Administered 2020-06-03: 0.5 mL via INTRAMUSCULAR
  Filled 2020-06-03: qty 0.5

## 2020-06-03 MED ORDER — LIDOCAINE HCL (PF) 1 % IJ SOLN
INTRAMUSCULAR | Status: AC
  Filled 2020-06-03: qty 5

## 2020-06-03 NOTE — ED Notes (Signed)
Patient granddaughter at bedside. Patient ambulatory with standby assist. Steady gait noted. Pt able to sit and use bathroom with minimal assistance. Pt changed into clean brief and blue scrub pants provided. Patient and granddaughter both feel comfortable with plan to for patient to stay with family tonight. Provider aware.

## 2020-06-03 NOTE — ED Notes (Signed)
Patient transported to CT 

## 2020-06-03 NOTE — ED Triage Notes (Signed)
Pt to ER via EMS with c/o mechanical fall while walking with walker.  Pt fell forward and hit her head.  Pt denies blood thinners, denies LOC.  Pt with noted laceration above right eyebrow.

## 2020-06-03 NOTE — ED Notes (Signed)
Provider spoke with patient family. Plan for patient family to come to ED and ensure that patient is able to ambulate with their assistance before d/c home. Patient aware of plan and verbalizes understanding.

## 2020-06-03 NOTE — ED Notes (Signed)
This tech and Mayra, EDT placed a purewick to assist the patient to urinate.

## 2020-06-03 NOTE — Discharge Instructions (Addendum)
Sutures will need to be removed in 5 days.  CT scans were negative.  Return to ER for confusion or any other concerns

## 2020-06-03 NOTE — ED Notes (Signed)
This tech, Mayra EDT, and Musician assisted pt w/ ambulation in room. Pt requires 2x assist. Pt unsteady. RN and MD aware.

## 2020-06-03 NOTE — ED Provider Notes (Signed)
[facility] Emergency Department Provider Note  ____________________________________________   Event Date/Time   First MD Initiated Contact with Patient 06/03/20 1715     (approximate)  I have reviewed the triage vital signs and the nursing notes.   HISTORY  Chief Complaint Fall    HPI Jane Perez is a 87 y.o. female with asthma, kidney disease, liver disease, DVT but does not appear to be on blood thinners who comes in for fall.  Patient reports mechanical fall while she was walking with her walker.  Patient states that she fell forward and hit her head and fell onto her side.  Patient denies any other injuries except for a laceration above her right eye.  Denies any vision changes.  He states the pain is minimal, constant, nothing makes it better, nothing makes it worse.           Past Medical History:  Diagnosis Date  . Asthma   . Bladder cancer ([place])   . Colitis   . DVT (deep venous thrombosis) ([place])   . Endometriosis   . Heart murmur   . HTN (hypertension)   . Kidney disease   . Liver disease   . Lung cancer ([place])   . Pulmonary thrombosis [facility])     Patient Active Problem List   Diagnosis Date Noted  . History of bladder cancer 04/12/2015  . Recurrent UTI 01/09/2015  . Atrophic vaginitis 01/09/2015    Past Surgical History:  Procedure Laterality Date  . ABDOMINAL HYSTERECTOMY    . Bladder cancer surgery    . Endometrial surgery    . LUNG REMOVAL, PARTIAL Right   . SMALL INTESTINE SURGERY     x 2    Prior to Admission medications   Medication Sig Start Date End Date Taking? Authorizing Provider  Ascorbic Acid (VITAMIN C) 1000 MG tablet Take by mouth.    [provider]  atenolol (TENORMIN) 25 MG tablet TAKE ONE TABLET EVERY DAY 10/07/14   [provider]  Cyanocobalamin (RA VITAMIN B-12 TR) 1000 MCG TBCR Take by mouth.    [provider]  fluconazole (DIFLUCAN) 100 MG tablet Reported on 04/11/2015  10/17/14   [provider]  ipratropium-albuterol (DUONEB) 0.5-2.5 (3) MG/3ML SOLN Inhale into the lungs. Reported on 04/11/2015 03/14/15   [provider]  Multiple Vitamin (MULTI-VITAMINS) TABS Take by mouth.    [provider]  nitrofurantoin, macrocrystal-monohydrate, (MACROBID) 100 MG capsule Reported on 04/11/2015 12/10/14   [provider]  Probiotic Product (ALIGN) 4 MG CAPS Take by mouth.    [provider]  vitamin E 1000 UNIT capsule Take by mouth.    [provider]    Allergies Aspirin, Ciprofloxacin, Codeine, Metronidazole, Penicillins, Sulfa antibiotics, Clindamycin hcl, Nitrofurantoin monohyd macro, Other, and Povidone iodine  Family History  Problem Relation Age of Onset  . Kidney disease Brother   . Bladder Cancer Neg Hx   . Kidney disease Mother   . Lung cancer Father   . Colon cancer Mother   . Hypertension Other        multiple family members    Social History Social History   Tobacco Use  . Smoking status: Never Smoker  Substance Use Topics  . Alcohol use: No    Alcohol/week: 0.0 standard drinks  . Drug use: No      Review of Systems Constitutional: No fever/chills, fall Eyes: No visual changes. Laceration above eye  ENT: No sore throat. Cardiovascular: Denies  chest pain. Respiratory: Denies shortness of breath. Gastrointestinal: No abdominal pain.  No nausea, no vomiting.  No diarrhea.  No constipation. Genitourinary: Negative for dysuria. Musculoskeletal: Negative for back pain. Skin: Negative for rash. Neurological: Negative for headaches, focal weakness or numbness.  Positive head injury All other ROS negative ____________________________________________   PHYSICAL EXAM:  VITAL SIGNS: ED Triage Vitals  Enc Vitals Group     BP 06/03/20 1719 134/78     Pulse Rate 06/03/20 1719 79     Resp 06/03/20 1719 18     Temp 06/03/20 1719 98.3 F (36.8 C)     Temp Source 06/03/20 1719 Oral      SpO2 06/03/20 1719 96 %     Weight 06/03/20 1720 179 lb (81.2 kg)     Height 06/03/20 1720 5\' 2"  (1.575 m)     Head Circumference --      Peak Flow --      Pain Score 06/03/20 1719 3     Pain Loc --      Pain Edu? --      Excl. in [place]? --     Constitutional: Alert and oriented. Well appearing and in no acute distress. Eyes: Conjunctivae are normal. EOMI. laceration above the right eye Head: Atraumatic. Nose: No congestion/rhinnorhea. Mouth/Throat: Mucous membranes are moist.   Neck: No stridor. Trachea Midline. FROM.  No obvious C-spine tenderness Cardiovascular: Normal rate, regular rhythm. Grossly normal heart sounds.  Good peripheral circulation.  No chest wall tenderness Respiratory: Normal respiratory effort.  No retractions. Lungs CTAB. Gastrointestinal: Soft and nontender. No distention. No abdominal bruits.  Musculoskeletal: No lower extremity tenderness nor edema.  No joint effusions.  No other extremity tenderness.  Good strength in her arms and her legs moving them well. Neurologic:  Normal speech and language. No gross focal neurologic deficits are appreciated.  Skin:  Skin is warm, dry and intact. No rash noted. Psychiatric: Mood and affect are normal. Speech and behavior are normal. GU: Deferred   ____________________________________________   LABS (all labs ordered are listed, but only abnormal results are displayed)  Labs Reviewed  BASIC METABOLIC PANEL - Abnormal; Notable for the following components:      Result Value   Glucose, Bld 109 (*)    Creatinine, Ser 1.18 (*)    GFR, Estimated 44 (*)    All other components within normal limits  URINALYSIS, COMPLETE (UACMP) WITH MICROSCOPIC - Abnormal; Notable for the following components:   Color, Urine STRAW (*)    APPearance CLEAR (*)    Specific Gravity, Urine 1.002 (*)    Bacteria, UA RARE (*)    All other components within normal limits  CBC WITH DIFFERENTIAL/PLATELET    ____________________________________________   ED ECG REPORT I, Vanessa [place], the attending physician, personally viewed and interpreted this ECG.  Sinus rate of 72, no ST elevation, no T wave inversions, normal intervals, Q-wave in lead III.  No priors to compare to ____________________________________________  RADIOLOGY   Official radiology report(s): CT Head Wo Contrast  Result Date: 06/03/2020 CLINICAL DATA:  Facial trauma, mechanical fall while walking with walker, fell forward striking head, denies loss of consciousness, RIGHT supraorbital laceration. History bladder cancer, hypertension, lung cancer EXAM: CT HEAD WITHOUT CONTRAST CT MAXILLOFACIAL WITHOUT CONTRAST CT CERVICAL SPINE WITHOUT CONTRAST TECHNIQUE: Multidetector CT imaging of the head, cervical spine, and maxillofacial structures were performed using the standard protocol without intravenous contrast. Multiplanar CT image reconstructions of the cervical spine and maxillofacial structures were  also generated. Right side of face marked with BB. COMPARISON:  CT head 02/17/2014 FINDINGS: CT HEAD FINDINGS Brain: Generalized atrophy. Normal ventricular morphology. No midline shift or mass effect. Small vessel chronic ischemic changes of deep cerebral white matter. No intracranial hemorrhage, mass lesion or evidence of acute infarction. No extra-axial fluid collections. Vascular: Atherosclerotic calcification of internal carotid arteries at skull base. No hyperdense vessels. Skull: Demineralized but intact Other: N/A CT MAXILLOFACIAL FINDINGS Osseous: Osseous demineralization. TMJ alignment normal bilaterally. Facial bones intact. Orbits: Bony orbits intact.  Intraorbital soft tissue planes clear. Sinuses: Small amount of mucus within RIGHT sphenoid sinus. Remaining paranasal sinuses, mastoid air cells, and middle ear cavities clear Soft tissues: Minimal RIGHT supraorbital scalp soft tissue swelling. Soft tissues otherwise unremarkable.  CT CERVICAL SPINE FINDINGS Alignment: Minimal anterolisthesis at C7-T1 and C5-C6. Remaining alignments normal. Skull base and vertebrae: Osseous demineralization. Skull base intact. Multilevel facet degenerative changes. Disc space narrowing and endplate spur formation throughout cervical spine. Vertebral body heights maintained without fracture or bone destruction. Soft tissues and spinal canal: Prevertebral soft tissues normal thickness. Soft tissues otherwise unremarkable. Disc levels:  No additional abnormalities Upper chest: Lung apices clear Other: N/A IMPRESSION: Atrophy with small vessel chronic ischemic changes of deep cerebral white matter. No acute intracranial abnormalities. No acute facial bone abnormalities. Multilevel degenerative disc and facet disease changes of the cervical spine. No acute cervical spine abnormalities. Electronically Signed   By: Lavonia Dana M.D.   On: 06/03/2020 18:54   CT Cervical Spine Wo Contrast  Result Date: 06/03/2020 CLINICAL DATA:  Facial trauma, mechanical fall while walking with walker, fell forward striking head, denies loss of consciousness, RIGHT supraorbital laceration. History bladder cancer, hypertension, lung cancer EXAM: CT HEAD WITHOUT CONTRAST CT MAXILLOFACIAL WITHOUT CONTRAST CT CERVICAL SPINE WITHOUT CONTRAST TECHNIQUE: Multidetector CT imaging of the head, cervical spine, and maxillofacial structures were performed using the standard protocol without intravenous contrast. Multiplanar CT image reconstructions of the cervical spine and maxillofacial structures were also generated. Right side of face marked with BB. COMPARISON:  CT head 02/17/2014 FINDINGS: CT HEAD FINDINGS Brain: Generalized atrophy. Normal ventricular morphology. No midline shift or mass effect. Small vessel chronic ischemic changes of deep cerebral white matter. No intracranial hemorrhage, mass lesion or evidence of acute infarction. No extra-axial fluid collections. Vascular:  Atherosclerotic calcification of internal carotid arteries at skull base. No hyperdense vessels. Skull: Demineralized but intact Other: N/A CT MAXILLOFACIAL FINDINGS Osseous: Osseous demineralization. TMJ alignment normal bilaterally. Facial bones intact. Orbits: Bony orbits intact.  Intraorbital soft tissue planes clear. Sinuses: Small amount of mucus within RIGHT sphenoid sinus. Remaining paranasal sinuses, mastoid air cells, and middle ear cavities clear Soft tissues: Minimal RIGHT supraorbital scalp soft tissue swelling. Soft tissues otherwise unremarkable. CT CERVICAL SPINE FINDINGS Alignment: Minimal anterolisthesis at C7-T1 and C5-C6. Remaining alignments normal. Skull base and vertebrae: Osseous demineralization. Skull base intact. Multilevel facet degenerative changes. Disc space narrowing and endplate spur formation throughout cervical spine. Vertebral body heights maintained without fracture or bone destruction. Soft tissues and spinal canal: Prevertebral soft tissues normal thickness. Soft tissues otherwise unremarkable. Disc levels:  No additional abnormalities Upper chest: Lung apices clear Other: N/A IMPRESSION: Atrophy with small vessel chronic ischemic changes of deep cerebral white matter. No acute intracranial abnormalities. No acute facial bone abnormalities. Multilevel degenerative disc and facet disease changes of the cervical spine. No acute cervical spine abnormalities. Electronically Signed   By: Lavonia Dana M.D.   On: 06/03/2020 18:54   CT Maxillofacial  Wo Contrast  Result Date: 06/03/2020 CLINICAL DATA:  Facial trauma, mechanical fall while walking with walker, fell forward striking head, denies loss of consciousness, RIGHT supraorbital laceration. History bladder cancer, hypertension, lung cancer EXAM: CT HEAD WITHOUT CONTRAST CT MAXILLOFACIAL WITHOUT CONTRAST CT CERVICAL SPINE WITHOUT CONTRAST TECHNIQUE: Multidetector CT imaging of the head, cervical spine, and maxillofacial  structures were performed using the standard protocol without intravenous contrast. Multiplanar CT image reconstructions of the cervical spine and maxillofacial structures were also generated. Right side of face marked with BB. COMPARISON:  CT head 02/17/2014 FINDINGS: CT HEAD FINDINGS Brain: Generalized atrophy. Normal ventricular morphology. No midline shift or mass effect. Small vessel chronic ischemic changes of deep cerebral white matter. No intracranial hemorrhage, mass lesion or evidence of acute infarction. No extra-axial fluid collections. Vascular: Atherosclerotic calcification of internal carotid arteries at skull base. No hyperdense vessels. Skull: Demineralized but intact Other: N/A CT MAXILLOFACIAL FINDINGS Osseous: Osseous demineralization. TMJ alignment normal bilaterally. Facial bones intact. Orbits: Bony orbits intact.  Intraorbital soft tissue planes clear. Sinuses: Small amount of mucus within RIGHT sphenoid sinus. Remaining paranasal sinuses, mastoid air cells, and middle ear cavities clear Soft tissues: Minimal RIGHT supraorbital scalp soft tissue swelling. Soft tissues otherwise unremarkable. CT CERVICAL SPINE FINDINGS Alignment: Minimal anterolisthesis at C7-T1 and C5-C6. Remaining alignments normal. Skull base and vertebrae: Osseous demineralization. Skull base intact. Multilevel facet degenerative changes. Disc space narrowing and endplate spur formation throughout cervical spine. Vertebral body heights maintained without fracture or bone destruction. Soft tissues and spinal canal: Prevertebral soft tissues normal thickness. Soft tissues otherwise unremarkable. Disc levels:  No additional abnormalities Upper chest: Lung apices clear Other: N/A IMPRESSION: Atrophy with small vessel chronic ischemic changes of deep cerebral white matter. No acute intracranial abnormalities. No acute facial bone abnormalities. Multilevel degenerative disc and facet disease changes of the cervical spine. No  acute cervical spine abnormalities. Electronically Signed   By: Lavonia Dana M.D.   On: 06/03/2020 18:54    ____________________________________________   PROCEDURES  Procedure(s) performed (including Critical Care):  Marland KitchenMarland KitchenLaceration Repair  Date/Time: 06/03/2020 9:10 PM Performed by: Vanessa [place], MD Authorized by: Vanessa [place], MD   Consent:    Consent obtained:  Emergent situation   Alternatives discussed:  No treatment Universal protocol:    Patient identity confirmed:  Verbally with patient Anesthesia:    Anesthesia method:  Local infiltration   Local anesthetic:  Lidocaine 2% WITH epi Laceration details:    Location:  Face   Face location:  R eyebrow   Length (cm):  6   Depth (mm):  1 Pre-procedure details:    Preparation:  Patient was prepped and draped in usual sterile fashion Exploration:    Limited defect created (wound extended): no     Contaminated: no   Treatment:    Area cleansed with:  Saline   Amount of cleaning:  Standard   Debridement:  None Skin repair:    Repair method:  Sutures   Suture size:  6-0   Suture material:  Prolene   Suture technique:  Simple interrupted   Number of sutures:  5 Approximation:    Approximation:  Close Repair type:    Repair type:  Simple Post-procedure details:    Dressing:  Antibiotic ointment   Procedure completion:  Tolerated well, no immediate complications     ____________________________________________   INITIAL IMPRESSION / ASSESSMENT AND PLAN / ED COURSE  Jane Perez was evaluated in Emergency Department on 06/03/2020 for the symptoms  described in the history of present illness. She was evaluated in the context of the global COVID-19 pandemic, which necessitated consideration that the patient might be at risk for infection with the SARS-CoV-2 virus that causes COVID-19. Institutional protocols and algorithms that pertain to the evaluation of patients at risk for COVID-19 are in a state of rapid change  based on information released by regulatory bodies including the CDC and federal and state organizations. These policies and algorithms were followed during the patient's care in the ED.    Patient is an 87 year old who comes in with mechanical fall with laceration above her right eye.  Will get CT imaging to evaluate for intracranial hemorrhage, facial fracture, cervical fracture.  Given patient's age also get EKG and basic labs to make sure no significant electrolyte abnormality or arrhythmia.  Patient denies any chest pain, abdominal pain or other extremity pain to suggest other injuries  9:11 PM Attempted to call son work and mobile- no answer   Labs are reassuring, CT scan negative, tetanus updated, laceration repaired and bacitracin omit placed  Initially the nurses attempted to walk her and she seemed a little unsteady.  I got a hold of the granddaughter who stated that she would come in and see how she did with walking to see if she felt safe with her going home.  Upon the night nurses assessment patient did much better with the walking with a walker and the patient stated that she was ready to go home and the granddaughter was at bedside and stated that she felt comfortable watching her and having her go home with her       ____________________________________________   FINAL CLINICAL IMPRESSION(S) / ED DIAGNOSES   Final diagnoses:  Fall, initial encounter  Injury of head, initial encounter  Facial laceration, initial encounter      MEDICATIONS GIVEN DURING THIS VISIT:  Medications  Tdap (BOOSTRIX) injection 0.5 mL (0.5 mLs Intramuscular Given 06/03/20 1844)  bacitracin ointment ( Topical Given 06/03/20 2002)  lidocaine-EPINEPHrine (XYLOCAINE W/EPI) 2 %-1:100000 (with pres) injection 20 mL (20 mLs Intradermal Given by Other 06/03/20 1935)     ED Discharge Orders    None       Note:  This document was prepared using Dragon voice recognition software and may include  unintentional dictation errors.   Vanessa [place], MD 06/03/20 [phone]

## 2020-06-03 NOTE — ED Notes (Signed)
Provider at bedside for laceration repair.

## 2020-06-06 ENCOUNTER — Telehealth: Payer: Self-pay | Admitting: Adult Health Nurse Practitioner

## 2020-06-06 NOTE — Telephone Encounter (Signed)
This is late entry.  Spoke with patient yesterday, 06/05/2020 Returned patient's voicemail.  She wanted to set up an earlier appointment from next week.  Set up an appointment for this coming Thursday.  Patient called back and stated that this was not a good day as she already had another appointment scheduled.  Kept appointment for next week.  Encouraged to call with any questions or concerns. Verneal Wiers K. Olena Heckle, NP

## 2020-06-14 ENCOUNTER — Telehealth: Payer: Self-pay | Admitting: Adult Health Nurse Practitioner

## 2020-06-14 NOTE — Telephone Encounter (Signed)
Returned patient's VM.  She thought our appointment was today.  Reminded her that our appointment is tomorrow at 2p Quilla Freeze K. Olena Heckle NP

## 2020-06-15 ENCOUNTER — Other Ambulatory Visit: Payer: Medicare Other | Admitting: Adult Health Nurse Practitioner

## 2020-06-15 ENCOUNTER — Other Ambulatory Visit: Payer: Self-pay

## 2020-06-15 DIAGNOSIS — G43909 Migraine, unspecified, not intractable, without status migrainosus: Secondary | ICD-10-CM

## 2020-06-15 DIAGNOSIS — Z515 Encounter for palliative care: Secondary | ICD-10-CM

## 2020-06-15 DIAGNOSIS — R5381 Other malaise: Secondary | ICD-10-CM

## 2020-06-15 DIAGNOSIS — G629 Polyneuropathy, unspecified: Secondary | ICD-10-CM

## 2020-06-15 NOTE — Progress Notes (Signed)
[facility] Consult Note Telephone: [phone]  Fax: [phone]  PATIENT NAME: Jane Perez DOB: [birth year] MRN: [id number]  PRIMARY CARE PROVIDER:   Tracie Harrier, MD  REFERRING PROVIDER:  Tracie Harrier, [facility] [place],  [place] [address]  RESPONSIBLE PARTY:   Self, [phone] Loletta Specter, son [phone]  Chief complaint: Initial palliative visit/debility  RECOMMENDATIONS and PLAN:  1.  Advanced care planning.  Patient is full code  2. Peripheral neuropathy.  Gets good relief with gabapentin.  Continue gabapentin at present dose and follow up with neurology  3.  Migraines.  Her migraines may be triggered by the hit to the head.  Discussed that she may have this for a while and discussed that if they worsened to go to the hospital for further evaluation.    4.  Debility.  Patient has aides that come in the home to help with showering, cleaning and shopping.   Palliative continue to monitor for symptom management/decline and make recommendations as needed.  Follow-up in 8 weeks.  Encouraged to call with any questions or concerns   HISTORY OF PRESENT ILLNESS:  Jane Perez is a 87 y.o. year old female with multiple medical problems including OA, asthma, CKD, colitis, migraines, h/o lung cancer with surgical resection, h/o bladder cancer requiring surgery. Palliative Care was asked to help address goals of care.Reviewed patient's EMR including most recent labs and imaging. Patient had fall on 06/03/20 and was evaluated at ER.  She did not have any acute fractures.  She did have laceration above right eye requiring stitches.  Stitches have been removed and laceration healing well.  She states that she had 2 other falls over a few days prior to this fall.  She does state that she gets headaches around where she hit her head since the fall and feels like she can't "think like I  used to."  She also has history of migraines. She does get relief with rest and does not take anything for the headache. She does repeat herself in conversation but is able to remember when the fall occurred and the details of it. Patient ambulates with walker and requires some assistance with ADLs and IADLs, such as cleaning and shopping.  She does remember volunteer from our organization calling her and just having a conversation with her. She appreciates the calls.  She does state feeling lonely due to the isolation of COVID.  She does have aides that come in the home to help with showering, cleaning, and shopping.  Denies hematuria, dysuria, N/V, constipation, hematochezia, chest pain, increased SOB or cough,   CODE STATUS: full code  PPS: 60% HOSPICE ELIGIBILITY/DIAGNOSIS: TBD  PHYSICAL EXAM: BP 124/66 HR 65 O2 95% on RA General: NAD, frail appearing Eyes: sclera anicteric and noninjected with no discharge noted ENMT: moist mucous membranes Cardiovascular: regular rate and rhythm Pulmonary:lung sounds clear; normal respiratory effort Abdomen: soft, nontender, + bowel sounds Extremities: no edema, no joint deformities Skin: no rasheson exposed skin; still has faint ecchymosis around right eye from fall Neurological: Weakness but otherwise nonfocal; states having forgetfulness  PAST MEDICAL HISTORY:  Past Medical History:  Diagnosis Date  . Asthma   . Bladder cancer ([place])   . Colitis   . DVT (deep venous thrombosis) ([place])   . Endometriosis   . Heart murmur   . HTN (hypertension)   . Kidney disease   . Liver disease   . Lung  cancer ([place])   . Pulmonary thrombosis ([facility])     SOCIAL HX:  Social History   Tobacco Use  . Smoking status: Never Smoker  . Smokeless tobacco: Not on file  Substance Use Topics  . Alcohol use: No    Alcohol/week: 0.0 standard drinks    ALLERGIES:  Allergies  Allergen Reactions  . Aspirin Other (See Comments)    Will cause hemerage   .  Ciprofloxacin Hives  . Codeine Other (See Comments)    Other Reaction: GI Upset  . Metronidazole Hives  . Penicillins Shortness Of Breath  . Sulfa Antibiotics Shortness Of Breath  . Clindamycin Hcl Other (See Comments)  . Nitrofurantoin Monohyd Macro Other (See Comments) and Nausea Only  . Other Other (See Comments)    Uncoded Allergy. Allergen: flu shot, Other Reaction: Other reaction  . Povidone Iodine Other (See Comments)    Other Reaction: Other reaction     PERTINENT MEDICATIONS:  Outpatient Encounter Medications as of 06/15/2020  Medication Sig  . Ascorbic Acid (VITAMIN C) 1000 MG tablet Take by mouth.  Marland Kitchen atenolol (TENORMIN) 25 MG tablet TAKE ONE TABLET EVERY DAY  . Cyanocobalamin (RA VITAMIN B-12 TR) 1000 MCG TBCR Take by mouth.  . fluconazole (DIFLUCAN) 100 MG tablet Reported on 04/11/2015  . ipratropium-albuterol (DUONEB) 0.5-2.5 (3) MG/3ML SOLN Inhale into the lungs. Reported on 04/11/2015  . Multiple Vitamin (MULTI-VITAMINS) TABS Take by mouth.  . nitrofurantoin, macrocrystal-monohydrate, (MACROBID) 100 MG capsule Reported on 04/11/2015  . Probiotic Product (ALIGN) 4 MG CAPS Take by mouth.  . vitamin E 1000 UNIT capsule Take by mouth.   No facility-administered encounter medications on file as of 06/15/2020.     Jonet Mathies Jenetta Downer, NP

## 2020-06-23 ENCOUNTER — Telehealth: Payer: Self-pay | Admitting: Adult Health Nurse Practitioner

## 2020-06-23 NOTE — Telephone Encounter (Signed)
Returned patient's VM.  Left VM with reason for call and call back info Angala Hilgers K. Olena Heckle NP

## 2020-07-11 ENCOUNTER — Telehealth: Payer: Self-pay | Admitting: Adult Health Nurse Practitioner

## 2020-07-11 NOTE — Telephone Encounter (Signed)
Returned patient's VM.  Patient states that she had a break again about a week ago and states her catalog from [employer] that she orders her "pads" for free is missing and found a little girls ball cap in her house and that her look is broke to her door.  She is waiting for her pastor to come by and help her contact [employer] to get another catalog.  States that she has her door locked fixed.  She is having some anxiety related to the break in.  She has agreed to have SW referral for possible counseling consult. Tamecka Milham K. Olena Heckle NP

## 2020-07-13 ENCOUNTER — Telehealth: Payer: Self-pay | Admitting: Adult Health Nurse Practitioner

## 2020-07-13 NOTE — Telephone Encounter (Signed)
Attempted several times to return patient's VM.  Was only able to get a busy signal.  Forwarded VM to our office to see if someone could get in touch with her. Zofia Peckinpaugh K. Olena Heckle NP

## 2020-07-13 NOTE — Telephone Encounter (Signed)
Called patient back for Palliative NP, to find out what she needed, (she had left a VM earlier for NP), no answer - left message requesting a return call, left my name and number.

## 2020-07-14 ENCOUNTER — Telehealth: Payer: Self-pay

## 2020-07-14 ENCOUNTER — Telehealth: Payer: Self-pay | Admitting: Adult Health Nurse Practitioner

## 2020-07-14 NOTE — Progress Notes (Signed)
07/12/20: Palliative care SW received referral from palliative care NP for mental health assistance e and counseling for patient. SW made referral to [facility] to outreach patient and match with appropriate mental health provider.        [facility] Update     for [facility]  Jane Perez     Jul 12 2020 at 1:37pm     [facility] team member sent an SMS to inform Jane Perez that a match has been made to a provider, Jane Perez at [facility]. Jane Perez was given the provider's contact information, which can be accessed any time by logging into [facility], in order to schedule an appointment. [facility] will follow up to offer [facility] further support if needed.
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
## 2020-07-14 NOTE — Telephone Encounter (Signed)
Patient leaves VMs and whenever try to call back only get busy signal Hisae Decoursey K. Olena Heckle NP

## 2020-07-17 ENCOUNTER — Telehealth: Payer: Self-pay | Admitting: Adult Health Nurse Practitioner

## 2020-07-17 NOTE — Telephone Encounter (Signed)
Patient called asking to be seen.  She did not elaborate.  Scheduled visit for 07/18/20 @ 11am Kaaren Nass K. Olena Heckle NP

## 2020-07-18 ENCOUNTER — Other Ambulatory Visit: Payer: Self-pay

## 2020-07-18 ENCOUNTER — Encounter: Payer: Self-pay | Admitting: Adult Health Nurse Practitioner

## 2020-07-18 ENCOUNTER — Other Ambulatory Visit: Payer: Medicare Other | Admitting: Adult Health Nurse Practitioner

## 2020-07-18 DIAGNOSIS — Z515 Encounter for palliative care: Secondary | ICD-10-CM

## 2020-07-18 DIAGNOSIS — F419 Anxiety disorder, unspecified: Secondary | ICD-10-CM
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
## 2020-07-25 ENCOUNTER — Telehealth: Payer: Self-pay

## 2020-07-25 NOTE — Telephone Encounter (Signed)
Volunteer called patient on behalf of [facility]. Patient is doing well at this time.  

## 2020-07-26 ENCOUNTER — Telehealth: Payer: Self-pay | Admitting: Adult Health Nurse Practitioner

## 2020-07-26 ENCOUNTER — Telehealth: Payer: Self-pay
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
## 2020-07-26 NOTE — Telephone Encounter (Signed)
07/26/20 @1 :15 PM: Palliative care SW outreached patients PCP office inquire about FL2 to assist patient with ALF placement, per palliative care NP - A. Olena Heckle request.   PCP in agreement to complete FL2. SW faxed [facility] FL2 to PCP office.   1:19 PM: SW outreached patient to make aware. Call unsuccessful. LVM, awaiting return call.

## 2020-07-27 ENCOUNTER — Telehealth: Payer: Self-pay | Admitting: Adult Health Nurse Practitioner

## 2020-07-27 NOTE — Telephone Encounter (Signed)
Spoke with son about conversation with his mom that she is interested in possible placement in ALF.  He really thinks she needs the 24/7 care an ALF could provide.  Attempted to answer his questions.  Reached out to SW to call him with furthering answering his questions.  Encouraged to call with any questions or concerns. Kayde Atkerson K. Olena Heckle NP

## 2020-08-04 ENCOUNTER — Telehealth: Payer: Self-pay

## 2020-08-04 NOTE — Telephone Encounter (Signed)
08/04/20 @140PM : Palliative care SW outreached Patients son, Timmothy Sours, to discuss long term planning for patient, per NP - A. Olena Heckle, request.   Son shared that he realizes ALF placement may be the best option for patient at this time due to her requiring more 24/7 supervision. SW discussed placement process and payer sources. SW answered all of sons questions at this time. Son aware of FL2 request from PCP office. SW emailed list of ALF's in [place] to son @ [email or web address].  SW will make son aware once FL2 is complete. No other questions/concerns at this time,

## 2020-08-09 ENCOUNTER — Telehealth: Payer: Self-pay | Admitting: Adult Health Nurse Practitioner

## 2020-08-09 NOTE — Telephone Encounter (Signed)
Spoke with patient to remind of appointment tomorrow Bharat Antillon K. Olena Heckle NP

## 2020-08-10 ENCOUNTER — Other Ambulatory Visit: Payer: Medicare Other | Admitting: Adult Health Nurse Practitioner

## 2020-08-10 ENCOUNTER — Telehealth: Payer: Self-pay | Admitting: Adult Health Nurse Practitioner

## 2020-08-10 ENCOUNTER — Other Ambulatory Visit: Payer: Self-pay

## 2020-08-10 DIAGNOSIS — Z515 Encounter for palliative care: Secondary | ICD-10-CM

## 2020-08-10 DIAGNOSIS — F419 Anxiety disorder, unspecified: Secondary | ICD-10-CM

## 2020-08-10 DIAGNOSIS — G629 Polyneuropathy, unspecified: Secondary | ICD-10-CM

## 2020-08-10 NOTE — Telephone Encounter (Signed)
Patient had left VM indicating she needed to reschedule today's appointment.  Spoke with patient and she was able to keep today's appointment at 2pm Jane Perez K. Olena Heckle NP
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
## 2020-09-14 ENCOUNTER — Telehealth: Payer: Self-pay | Admitting: Adult Health Nurse Practitioner

## 2020-09-14 NOTE — Telephone Encounter (Signed)
Returned patient's VM needing to confirm date and time of next appt.  Confirmed that next appt is 11/02/20 @ 2p Jane Perez K. Olena Heckle NP

## 2020-10-05 ENCOUNTER — Telehealth: Payer: Self-pay

## 2020-10-05 NOTE — Telephone Encounter (Signed)
10/05/20 @10AM : Palliative care SW outreached PCP office to request updated FL2 for ALF placement.  10/03/20: Patients son emailed SW stating that they are interested in the following communities for placement: [facility] (2) [place]; [place], [place] [id number] [phone]  [facility] [phone]) [facility] [address] [place], [place] [address] [phone]   SW outreached both, and both have beds available. SW encouraged family to tour facilities prior to making a selection.   SW awaiting feedback from PCP office in regard to updated FL2 to provide to ALF's.

## 2020-10-09 ENCOUNTER — Telehealth: Payer: Self-pay | Admitting: Student

## 2020-10-09 NOTE — Telephone Encounter (Signed)
Palliative NP returned call to patient. F/u appointment scheduled for Friday at 130 pm.

## 2020-10-13 ENCOUNTER — Other Ambulatory Visit: Payer: Medicare Other | Admitting: Student

## 2020-10-13 ENCOUNTER — Other Ambulatory Visit: Payer: Self-pay

## 2020-10-17 ENCOUNTER — Other Ambulatory Visit: Payer: Self-pay

## 2020-10-17 ENCOUNTER — Other Ambulatory Visit: Payer: Medicare Other | Admitting: Student

## 2020-10-17 DIAGNOSIS — Z515 Encounter for palliative care: Secondary | ICD-10-CM

## 2020-10-17 DIAGNOSIS — G629 Polyneuropathy, unspecified: Secondary | ICD-10-CM
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
## 2020-11-02 ENCOUNTER — Other Ambulatory Visit: Payer: Self-pay

## 2020-11-02 ENCOUNTER — Other Ambulatory Visit: Payer: Medicare Other | Admitting: Student

## 2020-11-21 ENCOUNTER — Other Ambulatory Visit: Payer: Self-pay

## 2020-11-21 ENCOUNTER — Encounter: Payer: Medicare Other | Admitting: Student

## 2020-11-22 ENCOUNTER — Other Ambulatory Visit: Payer: Self-pay

## 2020-11-22 ENCOUNTER — Non-Acute Institutional Stay: Payer: Medicare Other | Admitting: Student

## 2020-11-22 DIAGNOSIS — R531 Weakness: Secondary | ICD-10-CM

## 2020-11-22 DIAGNOSIS — N390 Urinary tract infection, site not specified: Secondary | ICD-10-CM

## 2020-11-22 DIAGNOSIS — Z515 Encounter for palliative care: Secondary | ICD-10-CM

## 2020-11-22 NOTE — Progress Notes (Signed)
[facility] Consult Note Telephone: [phone]  Fax: [phone]    Date of encounter: 11/22/20 10:52 AM PATIENT NAME: Jane Perez [id number]   [phone] (home)  DOB: 1930-06-02 MRN: [id number] PRIMARY CARE PROVIDER:    Merrily Brittle, NP-[facility]  REFERRING PROVIDER:   Merrily Brittle, NP-[facility]  RESPONSIBLE PARTY:    Contact Information     Name Relation Home Work Mobile   [place] Son  [phone]        I met face to face with patient and family in the facility. Palliative Care was asked to follow this patient by consultation request of  [facility] to address advance care planning and complex medical decision making. This is a follow up visit.                                   ASSESSMENT AND PLAN / RECOMMENDATIONS:   Advance Care Planning/Goals of Care: Goals include to maximize quality of life and symptom management.   CODE STATUS:  Full Code  Symptom Management/Plan:  Recurrent UTI's-continue drinking cranberry juice; adequate water intake encouraged.   Generalized weakness- new order on 9/6/22PT/OT to evaluate and treat due to weakness, recent fall. Encourage use of walker for safety.   Follow up Palliative Care Visit: Palliative care will continue to follow for complex medical decision making, advance care planning, and clarification of goals. Return in 8 weeks or prn.  I spent 40 minutes providing this consultation. More than 50% of the time in this consultation was spent in counseling and care coordination.   PPS: 50%  HOSPICE ELIGIBILITY/DIAGNOSIS: TBD  Chief Complaint: Palliative Medicine follow up visit.   HISTORY OF PRESENT ILLNESS:  SHAKEIA KRUS is a 87 y.o. year old female  with OSA, polyneuropathy, asthma, CKD, colitis, migraines, history of right lung cancer and bladder cancer.   Patient resides at the [facility].  She reports  doing well since transitioning from home.  Patient was recently treated for urinary tract infection with trimethoprim x7 days.  Patient denies having any pain, shortness of breath, constipation, nausea.  Patient reports having a fall about 3 weeks ago falling onto her right side. She endorses weakness to lower extremities. She is using Rollator walker for ambulation. She endorses a good appetite. Patient states she is sleeping well at night. No recent ER visits or hospitalizations. A 10-point review of systems is negative, except for the pertinent positives and negatives detailed in the HPI.     History obtained from review of EMR, discussion with primary team, and interview with family, facility staff/caregiver and/or Jane Perez.  I reviewed available labs, medications, imaging, studies and related documents from the EMR.  Records reviewed and summarized above.    Physical Exam: Pulse 68, resp 16, b/p 130/78, sats 97%  Constitutional: NAD General: frail appearing EYES: anicteric sclera, lids intact, no discharge  ENMT: intact hearing, oral mucous membranes moist, dentition intact CV: S1S2, RRR, no LE edema Pulmonary: LCTA, no increased work of breathing, no cough, room air Abdomen: normo-active BS + 4 quadrants, soft and non tender GU: deferred MSK: moves all extremities, ambulatory with walker Skin: warm and dry, no rashes or wounds on visible skin Neuro: generalized weakness, A & O x 3, forgetful Psych: non-anxious affect, pleasant Hem/lymph/immuno: no widespread bruising   Thank you for the  opportunity to participate in the care of Jane Perez.  The palliative care team will continue to follow. Please call our office at [phone] if we can be of additional assistance.   Ezekiel Slocumb, NP   COVID-19 PATIENT SCREENING TOOL Asked and negative response unless otherwise noted:   Have you had symptoms of covid, tested positive or been in contact with someone with symptoms/positive  test in the past 5-10 days? No

## 2021-03-14 ENCOUNTER — Non-Acute Institutional Stay: Payer: Commercial Managed Care - HMO | Admitting: Student

## 2021-03-14 ENCOUNTER — Other Ambulatory Visit: Payer: Self-pay

## 2021-03-14 DIAGNOSIS — Z515 Encounter for palliative care: Secondary | ICD-10-CM

## 2021-03-14 DIAGNOSIS — G629 Polyneuropathy, unspecified: Secondary | ICD-10-CM

## 2021-03-14 DIAGNOSIS — R531 Weakness: Secondary | ICD-10-CM

## 2021-03-14 DIAGNOSIS — R609 Edema, unspecified: Secondary | ICD-10-CM

## 2021-03-14 NOTE — Progress Notes (Signed)
Therapist, nutritional Palliative Care Consult Note Telephone: [phone]  Fax: [phone]    Date of encounter: 03/14/21 3:38 PM PATIENT NAME: Jane Perez [address] [place] [address]   [phone] (home)  DOB: [birth year] MRN: [id number] PRIMARY CARE PROVIDER:    Barbette Reichmann, MD,  [address] [place] [address] [phone]  REFERRING PROVIDER:   Barbette Reichmann, MD [address] [place],  [place] [address] [phone]  RESPONSIBLE PARTY:    Contact Information     Name Relation Home Work Mobile   Jane Perez  [phone]        I met face to face with patient in the facility. Palliative Care was asked to follow this patient by consultation request of  [facility] to address advance care planning and complex medical decision making. This is a follow up visit.                                   ASSESSMENT AND PLAN / RECOMMENDATIONS:   Advance Care Planning/Goals of Care: Goals include to maximize quality of life and symptom management.   CODE STATUS: Full Code  Symptom Management/Plan:  Polyneuropathy-patient does endorse pain to LE. She was previously on gabapentin and she states she is not currently taking. Will clarify if she is taking, if not, will request gabapentin 100 mg QHS.   LE edema-continue wearing compression hose daily; staff to assist with applying. Encouraged to elevate legs when in recliner or bed.   Generalized weakness-patient encouraged to use walker when ambulating. Monitor for falls/safety.  Follow up Palliative Care Visit: Palliative care will continue to follow for complex medical decision making, advance care planning, and clarification of goals. Return in 8-12 weeks or prn.   This visit was coded based on medical decision making (MDM).  PPS: 50%  HOSPICE ELIGIBILITY/DIAGNOSIS: TBD  Chief Complaint: Palliative  Medicine follow up visit.   HISTORY OF PRESENT ILLNESS:  Jane Perez is a 88 y.o. year old female  with OSA, polyneuropathy, asthma, CKD, colitis, migraines, history of right lung cancer and bladder cancer.   Patient resides at the [facility] [address]  Patient reports doing well.  She does state that she has been having increased pain to her legs. She states that she has taken gabapentin in the past but does not think she is no longer taking.  Her roommate reports patient having a fall in bathroom the week before Christmas. Patient states she is using her walker at all times. She does have lower extremity edema; facility staff helps with applying compression stockings. Good appetite reported. She endorses sleeping well at night.  No recent hospitalizations. A 10-point review of systems is negative, except for the pertinent positives and negatives detailed in the HPI.        History obtained from review of EMR, discussion with primary team, and interview with family, facility staff/caregiver and/or Jane Perez.  I reviewed available labs, medications, imaging, studies and related documents from the EMR.  Records reviewed and summarized above.    Physical Exam: Pulse 68, resp 18, b/p 140/80, sats 95% on room air Constitutional: NAD General: frail appearing EYES: anicteric sclera, lids intact, no discharge  ENMT: intact hearing, oral mucous membranes moist, dentition intact CV: S1S2, RRR, 1-2+ LE edema Pulmonary: LCTA, no increased work of breathing, no cough, room air Abdomen:  normo-active BS + 4 quadrants, soft and non tender, no ascites GU: deferred MSK: moves all extremities, ambulatory with walker Skin: warm and dry, no rashes or wounds on visible skin Neuro:  no generalized weakness, A & O x 3, forgetful Psych: non-anxious affect, pleasant Hem/lymph/immuno: no widespread bruising   Thank you for the opportunity to participate in the care of Jane Perez.  The palliative care team will  continue to follow. Please call our office at [phone] if we can be of additional assistance.   Jane Slocumb, NP   COVID-19 PATIENT SCREENING TOOL Asked and negative response unless otherwise noted:   Have you had symptoms of covid, tested positive or been in contact with someone with symptoms/positive test in the past 5-10 days? No

## 2021-03-16 ENCOUNTER — Telehealth: Payer: Self-pay | Admitting: Student

## 2021-03-16 NOTE — Telephone Encounter (Signed)
Palliative NP spoke with Mardene Celeste at [facility] care regarding request for patient's gabaentin to be restarted due to worsening leg pain/neuropathy. She will send order over to facility.

## 2021-05-13 ENCOUNTER — Emergency Department
Admission: EM | Admit: 2021-05-13 | Discharge: 2021-05-13 | Disposition: A | Discharge status: Skilled Nursing Facility | Payer: Medicare Other | Attending: Emergency Medicine | Admitting: Emergency Medicine

## 2021-05-13 ENCOUNTER — Emergency Department: Payer: Medicare Other

## 2021-05-13 DIAGNOSIS — Z85118 Personal history of other malignant neoplasm of bronchus and lung: Secondary | ICD-10-CM | POA: Diagnosis not present

## 2021-05-13 DIAGNOSIS — Z79899 Other long term (current) drug therapy: Secondary | ICD-10-CM | POA: Diagnosis not present

## 2021-05-13 DIAGNOSIS — S0990XA Unspecified injury of head, initial encounter: Secondary | ICD-10-CM | POA: Diagnosis present

## 2021-05-13 DIAGNOSIS — Z8551 Personal history of malignant neoplasm of bladder: Secondary | ICD-10-CM | POA: Insufficient documentation

## 2021-05-13 DIAGNOSIS — I129 Hypertensive chronic kidney disease with stage 1 through stage 4 chronic kidney disease, or unspecified chronic kidney disease: Secondary | ICD-10-CM | POA: Diagnosis not present

## 2021-05-13 DIAGNOSIS — J45909 Unspecified asthma, uncomplicated: Secondary | ICD-10-CM | POA: Insufficient documentation

## 2021-05-13 DIAGNOSIS — S0101XA Laceration without foreign body of scalp, initial encounter: Secondary | ICD-10-CM | POA: Diagnosis not present

## 2021-05-13 DIAGNOSIS — W01198A Fall on same level from slipping, tripping and stumbling with subsequent striking against other object, initial encounter: Secondary | ICD-10-CM | POA: Insufficient documentation

## 2021-05-13 DIAGNOSIS — N189 Chronic kidney disease, unspecified: Secondary | ICD-10-CM | POA: Insufficient documentation

## 2021-05-13 DIAGNOSIS — Y92129 Unspecified place in nursing home as the place of occurrence of the external cause: Secondary | ICD-10-CM | POA: Diagnosis not present

## 2021-05-13 DIAGNOSIS — W19XXXA Unspecified fall, initial encounter: Secondary | ICD-10-CM

## 2021-05-13 NOTE — ED Notes (Signed)
[facility] to transport to the [facility] ?

## 2021-05-13 NOTE — ED Triage Notes (Signed)
Patient BIB ems s/p fall. Patient is from [facility] at [facility] , states she was trying to reach medication when she fell and hit the back of her heas. Denies LOC . Patient is no on blood thinners .  ?

## 2021-05-13 NOTE — Discharge Instructions (Signed)
Staple removal in 7 to 10 days.  Return to the ER for worsening symptoms, persistent vomiting, lethargy or other concerns. ?

## 2021-05-13 NOTE — ED Provider Notes (Signed)
? ?[facility] ?Provider Note ? ? ? Event Date/Time  ? First MD Initiated Contact with Patient 05/13/21 0327   ?  (approximate) ? ? ?History  ? ?Fall ? ? ?HPI ? ?Jane Perez is a 88 y.o. female brought to the ED via EMS from the [facility] status post mechanical fall.  Patient states she got up to use the restroom using her walker and trying to reach medication when she lost her balance and fell, striking the back of her head on the door.  Did not strike her head on the floor.  Denies LOC.  Denies anticoagulant use.  Complains of pain at site of injury.  Denies vision changes, neck pain, chest pain, shortness of breath, abdominal pain, nausea, vomiting or dizziness. ?  ? ? ?Past Medical History  ? ?Past Medical History:  ?Diagnosis Date  ? Asthma   ? Bladder cancer ([place])   ? Colitis   ? DVT (deep venous thrombosis) ([place])   ? Endometriosis   ? Heart murmur   ? HTN (hypertension)   ? Kidney disease   ? Liver disease   ? Lung cancer ([place])   ? Pulmonary thrombosis ([place])   ? ? ? ?Active Problem List  ? ?Patient Active Problem List  ? Diagnosis Date Noted  ? History of bladder cancer 04/12/2015  ? Recurrent UTI 01/09/2015  ? Atrophic vaginitis 01/09/2015  ? ? ? ?Past Surgical History  ? ?Past Surgical History:  ?Procedure Laterality Date  ? ABDOMINAL HYSTERECTOMY    ? Bladder cancer surgery    ? Endometrial surgery    ? LUNG REMOVAL, PARTIAL Right   ? SMALL INTESTINE SURGERY    ? x 2  ? ? ? ?Home Medications  ? ?Prior to Admission medications   ?Medication Sig Start Date End Date Taking? Authorizing Provider  ?Ascorbic Acid (VITAMIN C) 1000 MG tablet Take by mouth.    [provider]  ?atenolol (TENORMIN) 25 MG tablet TAKE ONE TABLET EVERY DAY 10/07/14   [provider]  ?Cyanocobalamin 1000 MCG TBCR Take by mouth.    [provider]  ?fluconazole (DIFLUCAN) 100 MG tablet Reported on 04/11/2015 ?Patient not taking: Reported on 10/17/2020 10/17/14   [provider]   ?ipratropium-albuterol (DUONEB) 0.5-2.5 (3) MG/3ML SOLN Inhale into the lungs. Reported on 04/11/2015 03/14/15   [provider]  ?Multiple Vitamin (MULTI-VITAMINS) TABS Take by mouth.    [provider]  ?nitrofurantoin, macrocrystal-monohydrate, (MACROBID) 100 MG capsule Reported on 04/11/2015 12/10/14   [provider]  ?Probiotic Product (ALIGN) 4 MG CAPS Take by mouth.    [provider]  ?vitamin E 1000 UNIT capsule Take by mouth.    [provider]  ? ? ? ?Allergies  ?Aspirin, Ciprofloxacin, Codeine, Metronidazole, Penicillins, Sulfa antibiotics, Clindamycin hcl, Nitrofurantoin monohyd macro, Other, and Povidone iodine ? ? ?Family History  ? ?Family History  ?Problem Relation Age of Onset  ? Kidney disease Brother   ? Bladder Cancer Neg Hx   ? Kidney disease Mother   ? Lung cancer Father   ? Colon cancer Mother   ? Hypertension Other   ?     multiple family members  ? ? ? ?Physical Exam  ?Triage Vital Signs: ?ED Triage Vitals  ?Enc Vitals Group  ?   BP 05/13/21 0326 (!) 146/103  ?   Pulse Rate 05/13/21 0326 79  ?   Resp 05/13/21 0326 16  ?   Temp 05/13/21 0326 97.9 ?  F (36.6 ?C)  ?   Temp Source 05/13/21 0326 Oral  ?   SpO2 05/13/21 0326 94 %  ?   Weight --   ?   Height --   ?   Head Circumference --   ?   Peak Flow --   ?   Pain Score 05/13/21 0327 3  ?   Pain Loc --   ?   Pain Edu? --   ?   Excl. in [place]? --   ? ? ?Updated Vital Signs: ?BP (!) 146/103 (BP Location: Left Arm)   Pulse 79   Temp 97.9 ?F (36.6 ?C) (Oral)   Resp 16   SpO2 94%  ? ? ?General: Awake, no distress.  ?CV:  RRR.  Good peripheral perfusion.  ?Resp:  Normal effort.  CTA B. ?Abd:  Nontender.  No distention.  ?Other:  No tenderness to palpation cervical spine.  Approximately 1.5 cm vertically linear and superficial laceration left parietal occipital scalp without active bleeding. ? ? ?ED Results / Procedures / Treatments  ?Labs ?(all labs ordered are listed, but only abnormal results are  displayed) ?Labs Reviewed - No data to display ? ? ?EKG ? ?ED ECG REPORT ?I, Paulette Blanch, the attending physician, personally viewed and interpreted this ECG. ? ? Date: 05/13/2021 ? EKG Time: 0336 ? Rate: 74 ? Rhythm: normal sinus rhythm ? Axis: Normal ? Intervals:none ? ST&T Change: Nonspecific ? ? ? ?RADIOLOGY ?I have independently visualized and reviewed patient's CT head as well as noted the radiology interpretation: ? ?CT head: No ICH ? ?Official radiology report(s): ?CT Head Wo Contrast ? ?Result Date: 05/13/2021 ?CLINICAL DATA:  Status post fall. EXAM: CT HEAD WITHOUT CONTRAST TECHNIQUE: Contiguous axial images were obtained from the base of the skull through the vertex without intravenous contrast. RADIATION DOSE REDUCTION: This exam was performed according to the departmental dose-optimization program which includes automated exposure control, adjustment of the mA and/or kV according to patient size and/or use of iterative reconstruction technique. COMPARISON:  June 05, 2020 FINDINGS: Brain: There is mild cerebral atrophy with widening of the extra-axial spaces and ventricular dilatation. There are areas of decreased attenuation within the white matter tracts of the supratentorial brain, consistent with microvascular disease changes. Vascular: No hyperdense vessel or unexpected calcification. Skull: Normal. Negative for fracture or focal lesion. Sinuses/Orbits: No acute finding. Other: Mild left parietooccipital scalp soft tissue swelling is seen. IMPRESSION: 1. Mild left parietooccipital scalp soft tissue swelling, without evidence of an acute fracture or acute intracranial abnormality. 2. Generalized cerebral atrophy with chronic small vessel white matter ischemic changes. Electronically Signed   By: Virgina Norfolk M.D.   On: 05/13/2021 04:37   ? ? ?PROCEDURES: ? ?Critical Care performed: No ? ?Marland Kitchen.Laceration Repair ? ?Date/Time: 05/13/2021 5:12 AM ?Performed by: Paulette Blanch, MD ?Authorized by: Paulette Blanch, MD  ? ?Consent:  ?  Consent obtained:  Verbal ?  Consent given by:  Patient ?  Risks discussed:  Infection, pain, poor cosmetic result and poor wound healing ?Anesthesia:  ?  Anesthesia method:  Topical application ?  Topical anesthesia: PainEase. ?Laceration details:  ?  Location:  Scalp ?  Scalp location:  Occipital ?  Length (cm):  1.5 ?  Depth (mm):  2 ?Exploration:  ?  Hemostasis achieved with:  Direct pressure ?  Contaminated: no   ?Treatment:  ?  Area cleansed with:  Saline ?  Amount of cleaning:  Standard ?  Irrigation solution:  Sterile  saline ?  Visualized foreign bodies/material removed: no   ?Skin repair:  ?  Repair method:  Staples ?  Number of staples:  2 ?Approximation:  ?  Approximation:  Close ?Repair type:  ?  Repair type:  Simple ?Post-procedure details:  ?  Dressing:  Open (no dressing) ?  Procedure completion:  Tolerated well, no immediate complications ? ? ?MEDICATIONS ORDERED IN ED: ?Medications - No data to display ? ? ?IMPRESSION / MDM / ASSESSMENT AND PLAN / ED COURSE  ?I reviewed the triage vital signs and the nursing notes. ?             ?               ?88 year old female presenting with minor head injury status post mechanical fall.  Differential diagnosis includes but is not limited to [place], SDH, scalp laceration, etc.  I have personally reviewed patient's records and note palliative care nursing home visit from 03/14/2021. ? ?We will obtain CT head and EKG.  Will reassess. ? ?Clinical Course as of 05/13/21 0551  ?Sun May 13, 2021  ?0511 Updated patient on negative CT head.  2 staples placed.  Will discharge back to nursing facility.  Strict return precautions given.  Patient verbalizes understanding and agrees with plan of care. [JS]  ?  ?Clinical Course User Index ?[JS] Paulette Blanch, MD  ? ? ? ?FINAL CLINICAL IMPRESSION(S) / ED DIAGNOSES  ? ?Final diagnoses:  ?Fall, initial encounter  ?Injury of head, initial encounter  ?Scalp laceration, initial encounter  ? ? ? ?Rx / DC Orders   ? ?ED Discharge Orders   ? ? None  ? ?  ? ? ? ?Note:  This document was prepared using Dragon voice recognition software and may include unintentional dictation errors. ?  ?Paulette Blanch, MD ?05/13/21 [phone] ? ?

## 2021-06-21 ENCOUNTER — Non-Acute Institutional Stay: Payer: Medicare Other | Admitting: Student

## 2021-06-21 DIAGNOSIS — R531 Weakness: Secondary | ICD-10-CM

## 2021-06-21 DIAGNOSIS — G629 Polyneuropathy, unspecified: Secondary | ICD-10-CM

## 2021-06-21 DIAGNOSIS — R609 Edema, unspecified: Secondary | ICD-10-CM

## 2021-06-21 DIAGNOSIS — Z515 Encounter for palliative care: Secondary | ICD-10-CM

## 2021-06-21 DIAGNOSIS — K921 Melena: Secondary | ICD-10-CM

## 2021-06-21 NOTE — Progress Notes (Signed)
? ? ?Manufacturing engineer ?Community Palliative Care Consult Note ?Telephone: [phone]  ?Fax: [phone]  ? ? ?Date of encounter: 06/21/21 ?11:45 AM ?PATIENT NAME: Jane Perez ?[place] [address]   ?[phone] (home)  ?DOB: 04-22-1930 ?MRN: [id number] ?PRIMARY CARE PROVIDER:    ?Jane Harrier, MD,  ?[place] ?[place] [address] ?[phone] ? ?REFERRING PROVIDER:   ?[facility] ? ?RESPONSIBLE PARTY:    ?Contact Information   ? ? Name Relation Home Work Mobile  ? Jane Perez  [phone]  ? ?  ? ? ? ?I met face to face with patient and family in the facility. Palliative Care was asked to follow this patient by consultation request of [facility]  to address advance care planning and complex medical decision making. This is a follow up visit. ? ?                                 ASSESSMENT AND PLAN / RECOMMENDATIONS:  ? ?Advance Care Planning/Goals of Care: Goals include to maximize quality of life and symptom management. Patient/health care surrogate gave his/her permission to discuss. ? ?CODE STATUS: Full Code ? ?Symptom Management/Plan: ? ?Generalized weakness-patient encouraged to use walker when ambulating. Monitor for falls/safety. Patient was recently moved to a room closer to common area.  ? ?LE edema-continue wearing compression hose daily; staff to assist with applying. Encouraged to elevate legs when in recliner or bed.  ? ?Polyneuropathy-patient states her pain has been managed better. Continue gabapentin 100 mg QHS and acetaminophen PRN. ? ?Blood in stool-patient was to have hemoccult x 3 ordered on 06/05/21. Hemoglobin 13.5 on 06/04/21. Staff encouraged to monitor and report any bleeding to PCP. ?  ? ?Follow up Palliative Care Visit: Palliative care will continue to follow for complex medical decision making, advance care planning, and clarification of goals. Return in 8 weeks or prn. ? ? ?This visit was coded  based on medical decision making (MDM). ? ?PPS: 50% ? ?HOSPICE ELIGIBILITY/DIAGNOSIS: TBD ? ?Chief Complaint: Palliative Medicine follow up visit.  ? ?HISTORY OF PRESENT ILLNESS:  Jane Perez is a 88 y.o. year old female  with  OSA, polyneuropathy, asthma, CKD, colitis, migraines, history of right lung cancer and bladder cancer.  ?   ?Patient resides at the [facility]. She reports doing well; requires assistance with adl's. She does have forgetfulness; she did not recall falling with subsequent ED visit. She denies pain; states her leg pain has been managed better. She is using walker for ambulation. Good appetite reported. She is sleeping well at night. Patient reported to have blood in her stool; order on 06/05/21 for hemoccult x 3. Patient nor staff report any blood in stool recently. A 10-point review of systems is negative, except for the pertinent positives and negatives detailed in the HPI.  ? ?History obtained from review of EMR, discussion with primary team, and interview with family, facility staff/caregiver and/or Jane Perez.  ?I reviewed available labs, medications, imaging, studies and related documents from the EMR.  Records reviewed and summarized above.  ? ?Physical Exam: ?Pulse 68, resp 16, b/p 132/68, sats 96% on room air ?Constitutional: NAD ?General: frail appearing ?EYES: anicteric sclera, lids intact, no discharge  ?ENMT: intact hearing, oral mucous membranes moist, dentition intact ?CV: S1S2, RRR, 1+ LE edema ?Pulmonary: LCTA, no increased work of breathing, no cough, room air ?Abdomen: normo-active  BS + 4 quadrants, soft and non tender, no ascites ?GU: deferred ?MSK:  moves all extremities, ambulatory ?Skin: warm and dry, no rashes or wounds on visible skin ?Neuro: +generalized weakness, A & O x 2, forgetful ?Psych: non-anxious affect, pleasant ?Hem/lymph/immuno: no widespread bruising ? ? ?Thank you for the opportunity to participate in the care of Jane Perez.  The palliative care team  will continue to follow. Please call our office at [phone] if we can be of additional assistance.  ? ?Jane Slocumb, NP  ? ?COVID-19 PATIENT SCREENING TOOL ?Asked and negative response unless otherwise noted:  ? ?Have you had symptoms of covid, tested positive or been in contact with someone with symptoms/positive test in the past 5-10 days? No ? ?

## 2021-08-10 ENCOUNTER — Non-Acute Institutional Stay: Payer: Medicare Other | Admitting: Student

## 2021-08-10 DIAGNOSIS — G629 Polyneuropathy, unspecified: Secondary | ICD-10-CM

## 2021-08-10 DIAGNOSIS — Z515 Encounter for palliative care: Secondary | ICD-10-CM

## 2021-08-10 DIAGNOSIS — R609 Edema, unspecified: Secondary | ICD-10-CM

## 2021-08-13 NOTE — Progress Notes (Signed)
Therapist, nutritional Palliative Care Consult Note Telephone: [phone]  Fax: [phone]    Date of encounter: 08/10/2021  PATIENT NAME: Jane Perez [address] [place] [address]   [phone] (home)  DOB: 12-02-1930 MRN: [id number] PRIMARY CARE PROVIDER:    Barbette Reichmann, MD,  [address] [place] [address] [phone]  REFERRING PROVIDER:   [facility]  RESPONSIBLE PARTY:    Contact Information     Name Relation Home Work Mobile   Lysle Dingwall  [phone]        I met face to face with patient in the facility. Palliative Care was asked to follow this patient by consultation request of  [facility] to address advance care planning and complex medical decision making. This is a follow up visit.                                   ASSESSMENT AND PLAN / RECOMMENDATIONS:   Advance Care Planning/Goals of Care: Goals include to maximize quality of life and symptom management. Patient/health care surrogate gave his/her permission to discuss. CODE STATUS: Full Code  Symptom Management/Plan:  LE edema-continue wearing compression hose daily; staff to assist with applying. Encouraged to elevate legs when in recliner or bed.    Polyneuropathy-patient states her pain has been managed better. Continue gabapentin 100 mg QHS and acetaminophen PRN.  Follow up Palliative Care Visit: Palliative care will continue to follow for complex medical decision making, advance care planning, and clarification of goals. Return in 8 weeks or prn.  This visit was coded based on medical decision making (MDM).  PPS: 50%  HOSPICE ELIGIBILITY/DIAGNOSIS: TBD  Chief Complaint: Palliative Medicine follow up visit.   HISTORY OF PRESENT ILLNESS:  BEVELYN Perez is a 88 y.o. year old female  with OSA, polyneuropathy, asthma, CKD, colitis, migraines, history of right lung cancer and bladder cancer.    Patient  resides at the [facility]. She states she has been doing well. She denies pain, states her pain has been managed well. No recent falls; she uses walker for ambulation. She does endorse LE edema. She is wearing compression hose daily; no worsening of edema. Good appetite reported; weight has been stable. No recent infections, ED visits or hospitalizations. A 10-point review of systems is negative, except for the pertinent positives and negatives detailed in the HPI.   History obtained from review of EMR, discussion with primary team, and interview with family, facility staff/caregiver and/or Ms. Merten.  I reviewed available labs, medications, imaging, studies and related documents from the EMR.  Records reviewed and summarized above.    Physical Exam: Pulse 64, resp 16, bp 130/62, sats 98% on room air Constitutional: NAD General: frail appearing EYES: anicteric sclera, lids intact, no discharge  ENMT: intact hearing, oral mucous membranes moist CV: S1S2, RRR, 1+ LE edema Pulmonary: LCTA, no increased work of breathing, no cough, room air Abdomen: normo-active BS + 4 quadrants, soft and non tender GU: deferred MSK: moves all extremities, ambulatory with walker Skin: warm and dry, no rashes or wounds on visible skin Neuro:  no generalized weakness, A & O x 2, forgetful Psych: non-anxious affect, pleasant Hem/lymph/immuno: no widespread bruising   Thank you for the opportunity to participate in the care of Ms. Makki.  The palliative care team will continue to follow. Please call our office at  [phone] if we can be of additional assistance.   Ezekiel Slocumb, NP   COVID-19 PATIENT SCREENING TOOL Asked and negative response unless otherwise noted:   Have you had symptoms of covid, tested positive or been in contact with someone with symptoms/positive test in the past 5-10 days? No

## 2021-09-02 ENCOUNTER — Emergency Department: Payer: Medicare Other

## 2021-09-02 ENCOUNTER — Other Ambulatory Visit: Payer: Self-pay

## 2021-09-02 ENCOUNTER — Inpatient Hospital Stay: Payer: Medicare Other

## 2021-09-02 ENCOUNTER — Inpatient Hospital Stay (HOSPITAL_COMMUNITY)
Admit: 2021-09-02 | Discharge: 2021-09-02 | Disposition: A | Discharge status: Home / Self Care | Payer: Medicare Other | Attending: Internal Medicine | Admitting: Internal Medicine

## 2021-09-02 ENCOUNTER — Inpatient Hospital Stay
Admission: EM | Admit: 2021-09-02 | Discharge: 2021-09-04 | DRG: 292 | Disposition: A | Discharge status: Nursing Home (Includes ICF) | Payer: Medicare Other | Source: Skilled Nursing Facility | Attending: Osteopathic Medicine | Admitting: Osteopathic Medicine

## 2021-09-02 DIAGNOSIS — Z801 Family history of malignant neoplasm of trachea, bronchus and lung: Secondary | ICD-10-CM

## 2021-09-02 DIAGNOSIS — Z887 Allergy status to serum and vaccine status: Secondary | ICD-10-CM | POA: Diagnosis not present

## 2021-09-02 DIAGNOSIS — Z86711 Personal history of pulmonary embolism: Secondary | ICD-10-CM

## 2021-09-02 DIAGNOSIS — Z8551 Personal history of malignant neoplasm of bladder: Secondary | ICD-10-CM

## 2021-09-02 DIAGNOSIS — Z881 Allergy status to other antibiotic agents status: Secondary | ICD-10-CM | POA: Diagnosis not present

## 2021-09-02 DIAGNOSIS — Z883 Allergy status to other anti-infective agents status: Secondary | ICD-10-CM | POA: Diagnosis not present

## 2021-09-02 DIAGNOSIS — R600 Localized edema: Secondary | ICD-10-CM | POA: Diagnosis present

## 2021-09-02 DIAGNOSIS — Z87440 Personal history of urinary (tract) infections: Secondary | ICD-10-CM

## 2021-09-02 DIAGNOSIS — Z885 Allergy status to narcotic agent status: Secondary | ICD-10-CM

## 2021-09-02 DIAGNOSIS — I509 Heart failure, unspecified: Secondary | ICD-10-CM

## 2021-09-02 DIAGNOSIS — Z882 Allergy status to sulfonamides status: Secondary | ICD-10-CM | POA: Diagnosis not present

## 2021-09-02 DIAGNOSIS — Z886 Allergy status to analgesic agent status: Secondary | ICD-10-CM

## 2021-09-02 DIAGNOSIS — Y9301 Activity, walking, marching and hiking: Secondary | ICD-10-CM | POA: Diagnosis present

## 2021-09-02 DIAGNOSIS — Z9181 History of falling: Secondary | ICD-10-CM | POA: Diagnosis not present

## 2021-09-02 DIAGNOSIS — Z902 Acquired absence of lung [part of]: Secondary | ICD-10-CM

## 2021-09-02 DIAGNOSIS — R296 Repeated falls: Secondary | ICD-10-CM | POA: Diagnosis not present

## 2021-09-02 DIAGNOSIS — Z880 Allergy status to penicillin: Secondary | ICD-10-CM

## 2021-09-02 DIAGNOSIS — Z86718 Personal history of other venous thrombosis and embolism: Secondary | ICD-10-CM

## 2021-09-02 DIAGNOSIS — Y92099 Unspecified place in other non-institutional residence as the place of occurrence of the external cause: Secondary | ICD-10-CM | POA: Diagnosis not present

## 2021-09-02 DIAGNOSIS — N952 Postmenopausal atrophic vaginitis: Secondary | ICD-10-CM | POA: Diagnosis present

## 2021-09-02 DIAGNOSIS — S32009A Unspecified fracture of unspecified lumbar vertebra, initial encounter for closed fracture: Secondary | ICD-10-CM

## 2021-09-02 DIAGNOSIS — Z8249 Family history of ischemic heart disease and other diseases of the circulatory system: Secondary | ICD-10-CM

## 2021-09-02 DIAGNOSIS — Z79899 Other long term (current) drug therapy: Secondary | ICD-10-CM | POA: Diagnosis not present

## 2021-09-02 DIAGNOSIS — Z85118 Personal history of other malignant neoplasm of bronchus and lung: Secondary | ICD-10-CM | POA: Diagnosis not present

## 2021-09-02 DIAGNOSIS — R011 Cardiac murmur, unspecified: Secondary | ICD-10-CM | POA: Diagnosis present

## 2021-09-02 DIAGNOSIS — Z90710 Acquired absence of both cervix and uterus: Secondary | ICD-10-CM | POA: Diagnosis not present

## 2021-09-02 DIAGNOSIS — I5031 Acute diastolic (congestive) heart failure: Secondary | ICD-10-CM | POA: Diagnosis not present

## 2021-09-02 DIAGNOSIS — N390 Urinary tract infection, site not specified: Secondary | ICD-10-CM | POA: Diagnosis present

## 2021-09-02 DIAGNOSIS — R531 Weakness: Secondary | ICD-10-CM | POA: Diagnosis not present

## 2021-09-02 DIAGNOSIS — W1839XA Other fall on same level, initial encounter: Secondary | ICD-10-CM | POA: Diagnosis present

## 2021-09-02 DIAGNOSIS — W19XXXA Unspecified fall, initial encounter: Principal | ICD-10-CM

## 2021-09-02 DIAGNOSIS — S32009S Unspecified fracture of unspecified lumbar vertebra, sequela: Secondary | ICD-10-CM | POA: Diagnosis not present

## 2021-09-02 DIAGNOSIS — I5030 Unspecified diastolic (congestive) heart failure: Secondary | ICD-10-CM | POA: Diagnosis present

## 2021-09-02 DIAGNOSIS — I110 Hypertensive heart disease with heart failure: Secondary | ICD-10-CM | POA: Diagnosis not present

## 2021-09-02 LAB — BLOOD GAS, VENOUS
Acid-Base Excess: 4.2 mmol/L — ABNORMAL HIGH (ref 0.0–2.0)
Bicarbonate: 29.2 mmol/L — ABNORMAL HIGH (ref 20.0–28.0)
O2 Saturation: 74.8 %
Patient temperature: 37
pCO2, Ven: 44 mmHg (ref 44–60)
pH, Ven: 7.43 (ref 7.25–7.43)
pO2, Ven: 42 mmHg (ref 32–45)

## 2021-09-02 LAB — CBC WITH DIFFERENTIAL/PLATELET
Abs Immature Granulocytes: 0.07 10*3/uL (ref 0.00–0.07)
Basophils Absolute: 0 10*3/uL (ref 0.0–0.1)
Basophils Relative: 0 %
Eosinophils Absolute: 0.1 10*3/uL (ref 0.0–0.5)
Eosinophils Relative: 1 %
HCT: 40.8 % (ref 36.0–46.0)
Hemoglobin: 13.6 g/dL (ref 12.0–15.0)
Immature Granulocytes: 1 %
Lymphocytes Relative: 10 %
Lymphs Abs: 1 10*3/uL (ref 0.7–4.0)
MCH: 31.3 pg (ref 26.0–34.0)
MCHC: 33.3 g/dL (ref 30.0–36.0)
MCV: 94 fL (ref 80.0–100.0)
Monocytes Absolute: 1.1 10*3/uL — ABNORMAL HIGH (ref 0.1–1.0)
Monocytes Relative: 11 %
Neutro Abs: 7.7 10*3/uL (ref 1.7–7.7)
Neutrophils Relative %: 77 %
Platelets: 149 10*3/uL — ABNORMAL LOW (ref 150–400)
RBC: 4.34 MIL/uL (ref 3.87–5.11)
RDW: 13.1 % (ref 11.5–15.5)
WBC: 10 10*3/uL (ref 4.0–10.5)
nRBC: 0 % (ref 0.0–0.2)

## 2021-09-02 LAB — ECHOCARDIOGRAM COMPLETE
AR max vel: 1.32 cm2
AV Peak grad: 9.6 mmHg
Ao pk vel: 1.55 m/s
Area-P 1/2: 3.61 cm2
Height: 63 in
S' Lateral: 2.5 cm
Weight: 2934.76 oz

## 2021-09-02 LAB — URINALYSIS, ROUTINE W REFLEX MICROSCOPIC
Bilirubin Urine: NEGATIVE
Glucose, UA: NEGATIVE mg/dL
Hgb urine dipstick: NEGATIVE
Ketones, ur: NEGATIVE mg/dL
Nitrite: POSITIVE — AB
Protein, ur: NEGATIVE mg/dL
Specific Gravity, Urine: 1.011 (ref 1.005–1.030)
pH: 5 (ref 5.0–8.0)

## 2021-09-02 LAB — BASIC METABOLIC PANEL
Anion gap: 6 (ref 5–15)
BUN: 17 mg/dL (ref 8–23)
CO2: 27 mmol/L (ref 22–32)
Calcium: 9.9 mg/dL (ref 8.9–10.3)
Chloride: 104 mmol/L (ref 98–111)
Creatinine, Ser: 0.83 mg/dL (ref 0.44–1.00)
GFR, Estimated: >60 mL/min (ref >60)
Glucose, Bld: 100 mg/dL — ABNORMAL HIGH (ref 70–99)
Potassium: 4.5 mmol/L (ref 3.5–5.1)
Sodium: 137 mmol/L (ref 135–145)

## 2021-09-02 LAB — SAMPLE TO BLOOD BANK

## 2021-09-02 MED ORDER — ACETAMINOPHEN 650 MG RE SUPP: 650.0000 mg | Freq: Four times a day (QID) | RECTAL | Status: DC | PRN

## 2021-09-02 MED ORDER — OXYCODONE HCL 5 MG PO TABS
5.0000 mg | ORAL_TABLET | ORAL | Status: DC | PRN
  Administered 2021-09-03: 5 mg via ORAL
  Filled 2021-09-02: qty 1

## 2021-09-02 MED ORDER — SULFAMETHOXAZOLE-TRIMETHOPRIM 800-160 MG PO TABS
1.0000 | ORAL_TABLET | Freq: Once | ORAL | Status: AC
  Administered 2021-09-02: 1 via ORAL
  Filled 2021-09-02: qty 1

## 2021-09-02 MED ORDER — ATENOLOL 25 MG PO TABS
25.0000 mg | ORAL_TABLET | Freq: Every day | ORAL | Status: DC
  Administered 2021-09-02 – 2021-09-04 (×3): 25 mg via ORAL
  Filled 2021-09-02 (×3): qty 1

## 2021-09-02 MED ORDER — BISACODYL 5 MG PO TBEC: 5.0000 mg | DELAYED_RELEASE_TABLET | Freq: Every day | ORAL | Status: DC | PRN

## 2021-09-02 MED ORDER — DOXYCYCLINE HYCLATE 100 MG PO TABS
100.0000 mg | ORAL_TABLET | Freq: Two times a day (BID) | ORAL | Status: DC
  Administered 2021-09-03: 100 mg via ORAL
  Filled 2021-09-02: qty 1

## 2021-09-02 MED ORDER — ACETAMINOPHEN 325 MG PO TABS: 650.0000 mg | ORAL_TABLET | Freq: Four times a day (QID) | ORAL | Status: DC | PRN

## 2021-09-02 MED ORDER — SODIUM CHLORIDE 0.9 % IV SOLN: 1.0000 g | Freq: Once | INTRAVENOUS | Status: DC

## 2021-09-02 MED ORDER — IPRATROPIUM-ALBUTEROL 0.5-2.5 (3) MG/3ML IN SOLN
3.0000 mL | Freq: Four times a day (QID) | RESPIRATORY_TRACT | Status: DC | PRN
Start: 2021-09-02 — End: 2021-09-04

## 2021-09-02 MED ORDER — ENOXAPARIN SODIUM 40 MG/0.4ML IJ SOSY
0.5000 mg/kg | PREFILLED_SYRINGE | INTRAMUSCULAR | Status: DC
  Administered 2021-09-03: 42.5 mg via SUBCUTANEOUS
  Filled 2021-09-02: qty 0.8

## 2021-09-02 MED ORDER — MAGNESIUM SULFATE 2 GM/50ML IV SOLN
2.0000 g | Freq: Once | INTRAVENOUS | Status: AC
  Administered 2021-09-02: 2 g via INTRAVENOUS
  Filled 2021-09-02: qty 50

## 2021-09-02 MED ORDER — FUROSEMIDE 10 MG/ML IJ SOLN
40.0000 mg | Freq: Two times a day (BID) | INTRAMUSCULAR | Status: DC
  Administered 2021-09-02 – 2021-09-04 (×5): 40 mg via INTRAVENOUS
  Filled 2021-09-02 (×5): qty 4

## 2021-09-02 MED ORDER — MORPHINE SULFATE (PF) 4 MG/ML IV SOLN
4.0000 mg | Freq: Once | INTRAVENOUS | Status: AC
  Administered 2021-09-02: 4 mg via INTRAVENOUS
  Filled 2021-09-02: qty 1

## 2021-09-02 MED ORDER — CEPHALEXIN 500 MG PO CAPS: 500.0000 mg | ORAL_CAPSULE | Freq: Four times a day (QID) | ORAL | Status: DC

## 2021-09-03 DIAGNOSIS — S32009S Unspecified fracture of unspecified lumbar vertebra, sequela: Secondary | ICD-10-CM | POA: Diagnosis not present

## 2021-09-03 DIAGNOSIS — N952 Postmenopausal atrophic vaginitis: Secondary | ICD-10-CM | POA: Diagnosis not present

## 2021-09-03 DIAGNOSIS — N390 Urinary tract infection, site not specified: Secondary | ICD-10-CM

## 2021-09-03 DIAGNOSIS — I509 Heart failure, unspecified: Secondary | ICD-10-CM

## 2021-09-03 DIAGNOSIS — R296 Repeated falls: Secondary | ICD-10-CM

## 2021-09-03 DIAGNOSIS — Z8551 Personal history of malignant neoplasm of bladder: Secondary | ICD-10-CM

## 2021-09-03 LAB — TSH: TSH: 0.638 u[IU]/mL (ref 0.350–4.500)

## 2021-09-03 LAB — BASIC METABOLIC PANEL
Anion gap: 7 (ref 5–15)
BUN: 20 mg/dL (ref 8–23)
CO2: 29 mmol/L (ref 22–32)
Calcium: 9 mg/dL (ref 8.9–10.3)
Chloride: 102 mmol/L (ref 98–111)
Creatinine, Ser: 0.89 mg/dL (ref 0.44–1.00)
GFR, Estimated: >60 mL/min (ref >60)
Glucose, Bld: 95 mg/dL (ref 70–99)
Potassium: 3.6 mmol/L (ref 3.5–5.1)
Sodium: 138 mmol/L (ref 135–145)

## 2021-09-03 LAB — MAGNESIUM: Magnesium: 2.3 mg/dL (ref 1.7–2.4)

## 2021-09-03 MED ORDER — TRAZODONE HCL 50 MG PO TABS
50.0000 mg | ORAL_TABLET | Freq: Every day | ORAL | Status: DC
  Administered 2021-09-03: 50 mg via ORAL
  Filled 2021-09-03: qty 1

## 2021-09-03 MED ORDER — CEFUROXIME AXETIL 500 MG PO TABS
250.0000 mg | ORAL_TABLET | Freq: Two times a day (BID) | ORAL | Status: DC
  Administered 2021-09-03 – 2021-09-04 (×2): 250 mg via ORAL
  Filled 2021-09-03: qty 1
  Filled 2021-09-03 (×3): qty 0.5

## 2021-09-03 MED ORDER — CEFUROXIME AXETIL 250 MG PO TABS: 250.0000 mg | ORAL_TABLET | Freq: Two times a day (BID) | ORAL | Status: DC

## 2021-09-03 MED ORDER — CEFUROXIME AXETIL 250 MG PO TABS
250.0000 mg | ORAL_TABLET | Freq: Two times a day (BID) | ORAL | Status: DC
  Filled 2021-09-03: qty 1

## 2021-09-03 MED ORDER — ENOXAPARIN SODIUM 40 MG/0.4ML IJ SOSY
40.0000 mg | PREFILLED_SYRINGE | INTRAMUSCULAR | Status: DC
  Administered 2021-09-04: 40 mg via SUBCUTANEOUS
  Filled 2021-09-03: qty 0.4

## 2021-09-03 NOTE — Assessment & Plan Note (Addendum)
Suspect underlying HFpEF  Echo 09/02/21: LVEF 60 to 65%, normal function, no regional wall motion abnormalities, mild LVH. LV diastolic parameters are indeterminate. RV systolic function is normal, mildly enlarged.  (+)significant signs of fluid overload, start Lasix IV 40 mg twice daily, monitor kidney function.  Renal fxn WNL  Net IO Since Admission: -3,030 mL [09/04/21 0847]  DVT study negative   Patient records reviewed, last visit with PCP 12/22/2020: Only cardiac medications on file at that point were furosemide 20 mg no admin instructions, visit before that 11/08/20 was on atenolol 25 mg daily, but stopped that  HFpEF borderline based on echo but LV diastolic function noted indeterminate.   Relatively low BP --> start diuretic lower dose once IV diuresis is complete  if BP improves would Rx outpatient meds to consider low dose Entresto but would at least start low dose ACE/ARB + beta blocker, consider add SGLT2 / spironolactone if BP tolerates, would add statin   Cardiology follow-up outpatient

## 2021-09-03 NOTE — Progress Notes (Signed)
PROGRESS NOTE    Jane Perez  FAO:[id number] DOB: October 28, 1930  DOA: 09/02/2021 Date of Service: 09/03/21 PCP: Jane Reichmann, MD     Brief Narrative / Hospital Course:  Jane Perez is a 88 y.o. 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 C/o lower back pain.  ED and early admission 06/26:  Afebrile, nontachycardic, not hypotensive.  Imaging: nothing acute, see below, there is chronic degenerative MSK/spine disease --> TLSO back brace for comfort Edema, concern for CHF --> weakness, falls Admitted and started on diuresis, Echo pending, DVT negative, started doxycycline for UTI, PT consult placed 06/26: Renal fxn WNL. Net IO Since Admission: -2,260 mL [09/03/21 0808]. Echo: LVEF 60 to 65%, normal function, no regional wall motion abnormalities, mild LVH. LV diastolic parameters are indeterminate. RV systolic function is normal, mildly enlarged. Plan conitnue diurese overnight and may be able to discharge tomorrow, needs SNF see TOC notes    Consultants:  none  Procedures: Echocardiogram 09/02/21    Subjective: Patient reports      ASSESSMENT & PLAN:   Principal Problem:   Peripheral edema, weakness, concerning for CHF (congestive heart failure)  Active Problems:   Recurrent UTI   Atrophic vaginitis   History of bladder cancer   Frequent falls   Fracture of lumbar spine (HCC)   Frequent falls Appears to be a worsening of baseline ambulation status Imaging no new fracture Admitting hospitalist tried to reach patient's son however his phone appears to be just the assisted living facility operator number Neurologically, patient is nonfocal. Trial of TLSO and PT evaluation  Peripheral edema, weakness,  concerning for CHF (congestive heart failure)  Suspect underlying HFpEF Echo 09/02/21: LVEF 60 to 65%, normal function, no regional wall motion abnormalities, mild LVH. LV diastolic parameters are indeterminate. RV systolic function is normal, mildly enlarged. (+)significant signs of fluid overload, start Lasix IV 40 mg twice daily, monitor kidney function. Renal fxn WNL Net IO Since Admission: -2,260 mL [09/03/21 0810] DVT study negative  Patient records reviewed, last visit with PCP 12/22/2020: Only cardiac medications on file at that point were furosemide 20 mg no admin instructions, visit before that 11/08/20 was on atenolol 25 mg daily, but stopped that HFpEF borderline based on echo but LV diastolic function noted indeterminate.  Relatively low BP --> start diuretic lower dose once IV diuresis is complete if BP improves would Rx outpatient meds to consider low dose Entresto but would at least start low dose ACE/ARB + beta blocker, consider add SGLT2 / spironolactone if BP tolerates, would add statin   Recurrent UTI Multiple antibiotic allergies, decided to use doxycycline to treat the UTI  Atrophic vaginitis Consider topical estrogen outpatient to help prevent UTI  History of bladder cancer Follow outpatient w/ urology    DVT prophylaxis: Lovenox  Code Status: FULL Family Communication: called son, left voicemail Disposition Plan / TOC needs: PT eval pending, may need HH at least, possibly SNF  Barriers to discharge / significant pending items: continued diuresis, PT eval. Can probably d/c tomorrow 06/27             Objective: Vitals:   09/02/21 1536 09/02/21 1934 09/02/21 2355 09/03/21 0558  BP: 123/73 (!) 136/55 (!) 101/56 (!) 107/51  Pulse: 67 63 69 71  Resp: 17  19 19 16   Temp: 98.2 F (36.8 C) 98.5 F (36.9 C) 97.8 F (36.6 C) 97.8 F (36.6 C)  TempSrc:   Oral Tympanic  SpO2: 96% 95% 94% 95%  Weight:      Height:        Intake/Output Summary (Last 24  hours) at 09/03/2021 0810 Last data filed at 09/02/2021 1900 Gross per 24 hour  Intake 240 ml  Output 2500 ml  Net -2260 ml   Filed Weights   09/02/21 0301  Weight: 83.2 kg    Examination:  Constitutional:  VS as above General Appearance: alert, well-developed, well-nourished, NAD Ears, Nose, Mouth, Throat: Normal appearance Neck: No masses, trachea midline Respiratory: Normal respiratory effort Breath sounds normal, no wheeze/rhonchi/rales Cardiovascular: S1/S2 normal, no murmur/rub/gallop auscultated +1 lower extremity edema Musculoskeletal:  No clubbing/cyanosis of digits Neurological: No cranial nerve deficit on limited exam Psychiatric: Normal judgment/insight Normal mood and affect       Scheduled Medications:   atenolol  25 mg Oral Daily   doxycycline  100 mg Oral Q12H   enoxaparin (LOVENOX) injection  0.5 mg/kg Subcutaneous Q24H   furosemide  40 mg Intravenous BID    Continuous Infusions:   PRN Medications:  acetaminophen **OR** acetaminophen, bisacodyl, ipratropium-albuterol, oxyCODONE  Antimicrobials:  Anti-infectives (From admission, onward)    Start     Dose/Rate Route Frequency Ordered Stop   09/02/21 2200  doxycycline (VIBRA-TABS) tablet 100 mg        100 mg Oral Every 12 hours 09/02/21 1230     09/02/21 1200  cephALEXin (KEFLEX) capsule 500 mg  Status:  Discontinued        500 mg Oral Every 6 hours 09/02/21 1134 09/02/21 1142   09/02/21 0815  sulfamethoxazole-trimethoprim (BACTRIM DS) 800-160 MG per tablet 1 tablet        1 tablet Oral  Once 09/02/21 0804 09/02/21 0843   09/02/21 0800  cefTRIAXone (ROCEPHIN) 1 g in sodium chloride 0.9 % 100 mL IVPB  Status:  Discontinued        1 g 200 mL/hr over 30 Minutes Intravenous  Once 09/02/21 0759 09/02/21 0802       Data Reviewed: I have personally reviewed following labs and imaging studies  CBC: Recent Labs  Lab 09/02/21 0315 09/02/21 0539  WBC SPECIMEN CLOTTED 10.0  NEUTROABS  PENDING 7.7  HGB SPECIMEN CLOTTED 13.6  HCT SPECIMEN CLOTTED 40.8  MCV SPECIMEN CLOTTED 94.0  PLT SPECIMEN CLOTTED 149*   Basic Metabolic Panel: Recent Labs  Lab 09/02/21 0315 09/03/21 0438  NA 137 138  K 4.5 3.6  CL 104 102  CO2 27 29  GLUCOSE 100* 95  BUN 17 20  CREATININE 0.83 0.89  CALCIUM 9.9 9.0  MG  --  2.3   GFR: Estimated Creatinine Clearance: 43.8 mL/min (by C-G formula based on SCr of 0.89 mg/dL). Liver Function Tests: No results for input(s): "AST", "ALT", "ALKPHOS", "BILITOT", "PROT", "ALBUMIN" in the last 168 hours. No results for input(s): "LIPASE", "AMYLASE" in the last 168 hours. No results for input(s): "AMMONIA" in the last 168 hours. Coagulation Profile: No results for input(s): "INR", "PROTIME" in the last 168 hours. Cardiac Enzymes: No results for input(s): "CKTOTAL", "CKMB", "CKMBINDEX", "TROPONINI" in the last 168 hours. BNP (last 3 results) No results for input(s): "PROBNP" in the last 8760 hours. HbA1C: No results for input(s): "HGBA1C" in the last 72 hours. CBG: No results for input(s): "GLUCAP" in the last 168 hours. Lipid Profile: No results for input(s): "  CHOL", "HDL", "LDLCALC", "TRIG", "CHOLHDL", "LDLDIRECT" in the last 72 hours. Thyroid Function Tests: Recent Labs    09/03/21 0438  TSH 0.638   Anemia Panel: No results for input(s): "VITAMINB12", "FOLATE", "FERRITIN", "TIBC", "IRON", "RETICCTPCT" in the last 72 hours. Urine analysis:    Component Value Date/Time   COLORURINE YELLOW (A) 09/02/2021 0315   APPEARANCEUR HAZY (A) 09/02/2021 0315   APPEARANCEUR Clear 04/11/2015 1339   LABSPEC 1.011 09/02/2021 0315   PHURINE 5.0 09/02/2021 0315   GLUCOSEU NEGATIVE 09/02/2021 0315   HGBUR NEGATIVE 09/02/2021 0315   BILIRUBINUR NEGATIVE 09/02/2021 0315   BILIRUBINUR Negative 04/11/2015 1339   KETONESUR NEGATIVE 09/02/2021 0315   PROTEINUR NEGATIVE 09/02/2021 0315   NITRITE POSITIVE (A) 09/02/2021 0315   LEUKOCYTESUR MODERATE (A)  09/02/2021 0315   Sepsis Labs: @LABRCNTIP (procalcitonin:4,lacticidven:4)  No results found for this or any previous visit (from the past 240 hour(s)).       Radiology Studies last 96 hours: ECHOCARDIOGRAM COMPLETE  Result Date: 09/02/2021    ECHOCARDIOGRAM REPORT   Patient Name:   SALEENA LIEGEL Date of Exam: 09/02/2021 Medical Rec #:  [id number]    Height:       63.0 in Accession #:    [id number]   Weight:       183.4 lb Date of Birth:  14-Aug-1930    BSA:          1.864 m Patient Age:    89 years     BP:           111/58 mmHg Patient Gender: F            HR:           88 bpm. Exam Location:  [facility] Procedure: 2D Echo Indications:     CHF I50.31  History:         Patient has no prior history of Echocardiogram examinations.  Sonographer:     Overton Mam RDCS Referring Phys:  [id number] Emeline General Diagnosing Phys: Dietrich Pates MD  Sonographer Comments: Challenging and difficult study, the patient was experiencing severe back pain during this study. IMPRESSIONS  1. Left ventricular ejection fraction, by estimation, is 60 to 65%. The left ventricle has normal function. The left ventricle has no regional wall motion abnormalities. There is mild left ventricular hypertrophy. Left ventricular diastolic parameters are indeterminate.  2. Right ventricular systolic function is normal. The right ventricular size is mildly enlarged.  3. Trivial mitral valve regurgitation.  4. The aortic valve is tricuspid. Aortic valve regurgitation is mild. Aortic valve sclerosis is present, with no evidence of aortic valve stenosis. FINDINGS  Left Ventricle: Left ventricular ejection fraction, by estimation, is 60 to 65%. The left ventricle has normal function. The left ventricle has no regional wall motion abnormalities. The left ventricular internal cavity size was normal in size. There is  mild left ventricular hypertrophy. Left ventricular diastolic parameters are indeterminate. Right Ventricle: The right ventricular size is  mildly enlarged. Right vetricular wall thickness was not assessed. Right ventricular systolic function is normal. Left Atrium: Left atrial size was normal in size. Right Atrium: Right atrial size was normal in size. Pericardium: There is no evidence of pericardial effusion. Mitral Valve: There is mild thickening of the mitral valve leaflet(s). There is mild calcification of the mitral valve leaflet(s). Trivial mitral valve regurgitation. Tricuspid Valve: The tricuspid valve is grossly normal. Tricuspid valve regurgitation is mild. Aortic Valve: The aortic valve is tricuspid. Aortic valve regurgitation is mild. Aortic  valve sclerosis is present, with no evidence of aortic valve stenosis. Aortic valve peak gradient measures 9.6 mmHg. Pulmonic Valve: The pulmonic valve was normal in structure. Pulmonic valve regurgitation is trivial. Aorta: The aortic root and ascending aorta are structurally normal, with no evidence of dilitation. Venous: The inferior vena cava was not well visualized. IAS/Shunts: No atrial level shunt detected by color flow Doppler.  LEFT VENTRICLE PLAX 2D LVIDd:         3.65 cm LVIDs:         2.50 cm LV PW:         0.96 cm LV IVS:        1.24 cm LVOT diam:     1.80 cm LV SV:         41 LV SV Index:   22 LVOT Area:     2.54 cm  LEFT ATRIUM           Index LA diam:      3.70 cm 1.99 cm/m LA Vol (A2C): 23.7 ml 12.72 ml/m LA Vol (A4C): 42.3 ml 22.70 ml/m  AORTIC VALVE                 PULMONIC VALVE AV Area (Vmax): 1.32 cm     PV Vmax:          1.14 m/s AV Vmax:        155.00 cm/s  PV Peak grad:     5.2 mmHg AV Peak Grad:   9.6 mmHg     PR End Diast Vel: 2.07 msec LVOT Vmax:      80.30 cm/s LVOT Vmean:     52.700 cm/s LVOT VTI:       0.160 m  AORTA Ao Root diam: 3.00 cm Ao Asc diam:  2.80 cm MITRAL VALVE                TRICUSPID VALVE MV Area (PHT): 3.61 cm     TV Peak grad:   22.3 mmHg MV Decel Time: 210 msec     TV Vmax:        2.36 m/s MV E velocity: 81.00 cm/s MV A velocity: 104.00 cm/s  SHUNTS  MV E/A ratio:  0.78         Systemic VTI:  0.16 m                             Systemic Diam: 1.80 cm Dietrich Pates MD Electronically signed by Dietrich Pates MD Signature Date/Time: 09/02/2021/9:28:57 PM    Final    US Venous Img Lower Bilateral (DVT)  Result Date: 09/02/2021 CLINICAL DATA:  Bilateral leg swelling. EXAM: BILATERAL LOWER EXTREMITY VENOUS DOPPLER ULTRASOUND TECHNIQUE: Gray-scale sonography with graded compression, as well as color Doppler and duplex ultrasound were performed to evaluate the lower extremity deep venous systems from the level of the common femoral vein and including the common femoral, femoral, profunda femoral, popliteal and calf veins including the posterior tibial, peroneal and gastrocnemius veins when visible. The superficial great saphenous vein was also interrogated. Spectral Doppler was utilized to evaluate flow at rest and with distal augmentation maneuvers in the common femoral, femoral and popliteal veins. COMPARISON:  None Available. FINDINGS: RIGHT LOWER EXTREMITY Common Femoral Vein: No evidence of thrombus. Normal compressibility, respiratory phasicity and response to augmentation. Saphenofemoral Junction: No evidence of thrombus. Normal compressibility and flow on color Doppler imaging. Profunda Femoral Vein: No evidence of thrombus. Normal compressibility  and flow on color Doppler imaging. Femoral Vein: No evidence of thrombus. Normal compressibility, respiratory phasicity and response to augmentation. Popliteal Vein: No evidence of thrombus. Normal compressibility, respiratory phasicity and response to augmentation. Calf Veins: The posterior tibial vein is patent. The peroneal vein is not identified. Superficial Great Saphenous Vein: No evidence of thrombus. Normal compressibility. Venous Reflux:  None. Other Findings:  None. LEFT LOWER EXTREMITY Common Femoral Vein: No evidence of thrombus. Normal compressibility, respiratory phasicity and response to augmentation.  Saphenofemoral Junction: No evidence of thrombus. Normal compressibility and flow on color Doppler imaging. Profunda Femoral Vein: No evidence of thrombus. Normal compressibility and flow on color Doppler imaging. Femoral Vein: No evidence of thrombus. Normal compressibility, respiratory phasicity and response to augmentation. Popliteal Vein: No evidence of thrombus. Normal compressibility, respiratory phasicity and response to augmentation. Calf Veins: The calf veins are not well demonstrated due to significant swelling/edema. Superficial Great Saphenous Vein: No evidence of thrombus. Normal compressibility. Venous Reflux:  None. Other Findings:  None. IMPRESSION: No evidence of deep venous thrombosis in either lower extremity. Limited evaluation of the calf veins bilaterally. Electronically Signed   By: Rudie Meyer M.D.   On: 09/02/2021 13:36   CT Thoracic Spine Wo Contrast  Result Date: 09/02/2021 CLINICAL DATA:  Lumbar compression fracture. EXAM: CT THORACIC AND LUMBAR SPINE WITHOUT CONTRAST TECHNIQUE: Multidetector CT imaging of the thoracic and lumbar spine was performed without contrast. Multiplanar CT image reconstructions were also generated. RADIATION DOSE REDUCTION: This exam was performed according to the departmental dose-optimization program which includes automated exposure control, adjustment of the mA and/or kV according to patient size and/or use of iterative reconstruction technique. COMPARISON:  None Available. FINDINGS: CT THORACIC SPINE FINDINGS Alignment: No traumatic malalignment Vertebrae: Pronounced osteopenia. Remote appearing compression fractures at T3 and T4. Height loss is advanced to T4 with mild posteroinferior corner retropulsion. Remote spinous process fracture at T2 with corticated margins. No acute fracture is seen. Paraspinal and other soft tissues: No evidence of perispinal mass or hematoma. Disc levels: Generalized disc space narrowing and spondylitic spurring that is  bridging from T5 to T10. CT LUMBAR SPINE FINDINGS Segmentation: 5 lumbar type vertebrae Alignment: Exaggerated lumbar lordosis with grade 1 anterolisthesis at L3-4 to L5-S1. Vertebrae: No acute fracture. No incidental bone lesion. Remote L1 compression fracture with moderate height loss and mild retropulsion. Paraspinal and other soft tissues: Swelling and expansion of the right psoas Disc levels: T12- L1: Disc narrowing and mild spurring L1-L2: Disc narrowing and bulging with annular calcification. Asymmetric right facet spurring. Advanced right foraminal stenosis. Patent spinal canal L2-L3: Disc narrowing and bulging. Facet spurring and ligamentum flavum thickening. Moderate to advanced spinal stenosis. L3-L4: Degenerative facet spurring with anterolisthesis and asymmetric bulky spurring on the left. The disc is narrowed and bulging with advanced spinal stenosis. Advanced left foraminal impingement L4-L5: Degenerative facet spurring with fused anterolisthesis. Disc space narrowing with gas containing fissures. Mild spinal stenosis L5-S1:Facet osteoarthritis with anterolisthesis. The disc is narrowed and bulging. High-grade spinal stenosis. IMPRESSION: CT THORACIC SPINE IMPRESSION No acute finding.  Remote fractures of T2-T4. CT LUMBAR SPINE IMPRESSION 1. No acute finding.  Remote L1 compression fracture. 2. Advanced lumbar spine degeneration with anterolisthesis at L3-4 and below. 3. Compressive spinal stenosis at L2-3, L3-4, and L5-S1. 4. Advanced foraminal impingement on the left at L3-4 and right at L1-2. Electronically Signed   By: Tiburcio Pea M.D.   On: 09/02/2021 09:59   CT Lumbar Spine Wo Contrast  Result Date: 09/02/2021  CLINICAL DATA:  Lumbar compression fracture. EXAM: CT THORACIC AND LUMBAR SPINE WITHOUT CONTRAST TECHNIQUE: Multidetector CT imaging of the thoracic and lumbar spine was performed without contrast. Multiplanar CT image reconstructions were also generated. RADIATION DOSE REDUCTION:  This exam was performed according to the departmental dose-optimization program which includes automated exposure control, adjustment of the mA and/or kV according to patient size and/or use of iterative reconstruction technique. COMPARISON:  None Available. FINDINGS: CT THORACIC SPINE FINDINGS Alignment: No traumatic malalignment Vertebrae: Pronounced osteopenia. Remote appearing compression fractures at T3 and T4. Height loss is advanced to T4 with mild posteroinferior corner retropulsion. Remote spinous process fracture at T2 with corticated margins. No acute fracture is seen. Paraspinal and other soft tissues: No evidence of perispinal mass or hematoma. Disc levels: Generalized disc space narrowing and spondylitic spurring that is bridging from T5 to T10. CT LUMBAR SPINE FINDINGS Segmentation: 5 lumbar type vertebrae Alignment: Exaggerated lumbar lordosis with grade 1 anterolisthesis at L3-4 to L5-S1. Vertebrae: No acute fracture. No incidental bone lesion. Remote L1 compression fracture with moderate height loss and mild retropulsion. Paraspinal and other soft tissues: Swelling and expansion of the right psoas Disc levels: T12- L1: Disc narrowing and mild spurring L1-L2: Disc narrowing and bulging with annular calcification. Asymmetric right facet spurring. Advanced right foraminal stenosis. Patent spinal canal L2-L3: Disc narrowing and bulging. Facet spurring and ligamentum flavum thickening. Moderate to advanced spinal stenosis. L3-L4: Degenerative facet spurring with anterolisthesis and asymmetric bulky spurring on the left. The disc is narrowed and bulging with advanced spinal stenosis. Advanced left foraminal impingement L4-L5: Degenerative facet spurring with fused anterolisthesis. Disc space narrowing with gas containing fissures. Mild spinal stenosis L5-S1:Facet osteoarthritis with anterolisthesis. The disc is narrowed and bulging. High-grade spinal stenosis. IMPRESSION: CT THORACIC SPINE IMPRESSION No  acute finding.  Remote fractures of T2-T4. CT LUMBAR SPINE IMPRESSION 1. No acute finding.  Remote L1 compression fracture. 2. Advanced lumbar spine degeneration with anterolisthesis at L3-4 and below. 3. Compressive spinal stenosis at L2-3, L3-4, and L5-S1. 4. Advanced foraminal impingement on the left at L3-4 and right at L1-2. Electronically Signed   By: Tiburcio Pea M.D.   On: 09/02/2021 09:59   CT PELVIS WO CONTRAST  Result Date: 09/02/2021 CLINICAL DATA:  Hip trauma with fracture suspected. EXAM: CT PELVIS WITHOUT CONTRAST TECHNIQUE: Multidetector CT imaging of the pelvis was performed following the standard protocol without intravenous contrast. RADIATION DOSE REDUCTION: This exam was performed according to the departmental dose-optimization program which includes automated exposure control, adjustment of the mA and/or kV according to patient size and/or use of iterative reconstruction technique. COMPARISON:  Pelvis radiograph from earlier the same day. FINDINGS: Asymmetric expansion with patchy high-density in the right psoas. The regional fat is reticulated. No visible avulsion from the lesser trochanter. No acute fracture or hip dislocation. Sacroiliac and symphysis pubis degeneration. Generalized osteopenia. Advanced lower lumbar spine degeneration as described on dedicated imaging. Extensive atherosclerosis and distal colonic diverticulosis. Fatty umbilical hernia. IMPRESSION: Strain/hemorrhage in the right psoas. No acute fracture or hip dislocation. Electronically Signed   By: Tiburcio Pea M.D.   On: 09/02/2021 09:44   DG Hip Unilat W or Wo Pelvis 2-3 Views Right  Result Date: 09/02/2021 CLINICAL DATA:  Right hip pain after a fall. EXAM: DG HIP (WITH OR WITHOUT PELVIS) 2-3V RIGHT COMPARISON:  None Available. FINDINGS: Bones are diffusely demineralized. Frontal pelvis shows no evidence of an acute fracture. SI joints and symphysis pubis unremarkable. AP and frog-leg lateral views of  the  right hip show no evidence for an acute femoral neck fracture. No findings to suggest hip dislocation. IMPRESSION: Negative. Electronically Signed   By: Kennith Center M.D.   On: 09/02/2021 07:43   CT Head Wo Contrast  Result Date: 09/02/2021 CLINICAL DATA:  Neck trauma (Age >= 65y); Head trauma, minor (Age >= 65y). Fall, head injury. EXAM: CT HEAD WITHOUT CONTRAST CT CERVICAL SPINE WITHOUT CONTRAST TECHNIQUE: Multidetector CT imaging of the head and cervical spine was performed following the standard protocol without intravenous contrast. Multiplanar CT image reconstructions of the cervical spine were also generated. RADIATION DOSE REDUCTION: This exam was performed according to the departmental dose-optimization program which includes automated exposure control, adjustment of the mA and/or kV according to patient size and/or use of iterative reconstruction technique. COMPARISON:  CT head 05/13/2021, CT cervical spine 06/03/2020 FINDINGS: CT HEAD FINDINGS Brain: Normal anatomic configuration. Parenchymal volume loss is commensurate with the patient's age. Mild periventricular white matter changes are present likely reflecting the sequela of small vessel ischemia. No abnormal intra or extra-axial mass lesion or fluid collection. No abnormal mass effect or midline shift. No evidence of acute intracranial hemorrhage or infarct. Ventricular size is normal. Cerebellum unremarkable. Vascular: No asymmetric hyperdense vasculature at the skull base. Skull: Intact Sinuses/Orbits: Paranasal sinuses are clear. Ocular lenses have been removed. Orbits are otherwise unremarkable. Other: Mastoid air cells and middle ear cavities are clear. CT CERVICAL SPINE FINDINGS Alignment: Stable 2 mm anterolisthesis C5-6 and C7-T1. Skull base and vertebrae: Craniocervical alignment is normal. The atlantodental interval is not widened. The osseous structures are diffusely osteopenic. No acute fracture of the cervical spine. Cervical  vertebral body height is preserved. There is a severe compression deformity of T4 and mild compression deformity of T3 noted, age indeterminate. There is a remote appearing spinous process fracture of T2 with cortication of the fracture margins. There is ankylosis of the facet joints of C2-3 and C3-4 bilaterally. Soft tissues and spinal canal: Posterior disc osteophyte complex results in effacement of the anterior canal space and abutment with mild flattening of the thecal sac at C4-5. The spinal canal is otherwise widely patent. No canal hematoma. No prevertebral soft tissue swelling or paraspinal fluid collection. Disc levels: There is diffuse intervertebral disc space narrowing and endplate remodeling throughout the cervical spine, most severe at C4-C7 in keeping with changes of advanced degenerative disc disease. Prevertebral soft tissues are not thickened on sagittal reformats. Multilevel uncovertebral and facet arthrosis results in multilevel moderate to severe neuroforaminal narrowing, most severe bilaterally at C4-5. Upper chest: Negative. Other: None IMPRESSION: 1. No acute intracranial abnormality. No calvarial fracture. 2. No acute fracture or listhesis of the cervical spine. 3. Advanced multilevel degenerative disc and degenerative joint disease resulting in multilevel neuroforaminal narrowing, most severe bilaterally at C4-5. 4. Mild compression deformity of T3 and severe compression deformity of T4 without retropulsion, age indeterminate. This could be further assessed with MRI examination if indicated. 5. Remote appearing spinous process fracture of T2. Electronically Signed   By: Helyn Numbers M.D.   On: 09/02/2021 04:31   CT Cervical Spine Wo Contrast  Result Date: 09/02/2021 CLINICAL DATA:  Neck trauma (Age >= 65y); Head trauma, minor (Age >= 65y). Fall, head injury. EXAM: CT HEAD WITHOUT CONTRAST CT CERVICAL SPINE WITHOUT CONTRAST TECHNIQUE: Multidetector CT imaging of the head and cervical  spine was performed following the standard protocol without intravenous contrast. Multiplanar CT image reconstructions of the cervical spine were also generated. RADIATION DOSE REDUCTION:  This exam was performed according to the departmental dose-optimization program which includes automated exposure control, adjustment of the mA and/or kV according to patient size and/or use of iterative reconstruction technique. COMPARISON:  CT head 05/13/2021, CT cervical spine 06/03/2020 FINDINGS: CT HEAD FINDINGS Brain: Normal anatomic configuration. Parenchymal volume loss is commensurate with the patient's age. Mild periventricular white matter changes are present likely reflecting the sequela of small vessel ischemia. No abnormal intra or extra-axial mass lesion or fluid collection. No abnormal mass effect or midline shift. No evidence of acute intracranial hemorrhage or infarct. Ventricular size is normal. Cerebellum unremarkable. Vascular: No asymmetric hyperdense vasculature at the skull base. Skull: Intact Sinuses/Orbits: Paranasal sinuses are clear. Ocular lenses have been removed. Orbits are otherwise unremarkable. Other: Mastoid air cells and middle ear cavities are clear. CT CERVICAL SPINE FINDINGS Alignment: Stable 2 mm anterolisthesis C5-6 and C7-T1. Skull base and vertebrae: Craniocervical alignment is normal. The atlantodental interval is not widened. The osseous structures are diffusely osteopenic. No acute fracture of the cervical spine. Cervical vertebral body height is preserved. There is a severe compression deformity of T4 and mild compression deformity of T3 noted, age indeterminate. There is a remote appearing spinous process fracture of T2 with cortication of the fracture margins. There is ankylosis of the facet joints of C2-3 and C3-4 bilaterally. Soft tissues and spinal canal: Posterior disc osteophyte complex results in effacement of the anterior canal space and abutment with mild flattening of the  thecal sac at C4-5. The spinal canal is otherwise widely patent. No canal hematoma. No prevertebral soft tissue swelling or paraspinal fluid collection. Disc levels: There is diffuse intervertebral disc space narrowing and endplate remodeling throughout the cervical spine, most severe at C4-C7 in keeping with changes of advanced degenerative disc disease. Prevertebral soft tissues are not thickened on sagittal reformats. Multilevel uncovertebral and facet arthrosis results in multilevel moderate to severe neuroforaminal narrowing, most severe bilaterally at C4-5. Upper chest: Negative. Other: None IMPRESSION: 1. No acute intracranial abnormality. No calvarial fracture. 2. No acute fracture or listhesis of the cervical spine. 3. Advanced multilevel degenerative disc and degenerative joint disease resulting in multilevel neuroforaminal narrowing, most severe bilaterally at C4-5. 4. Mild compression deformity of T3 and severe compression deformity of T4 without retropulsion, age indeterminate. This could be further assessed with MRI examination if indicated. 5. Remote appearing spinous process fracture of T2. Electronically Signed   By: Helyn Numbers M.D.   On: 09/02/2021 04:31   DG Pelvis 1-2 Views  Result Date: 09/02/2021 CLINICAL DATA:  Fall, pelvic pain EXAM: PELVIS - 1-2 VIEW COMPARISON:  None Available. FINDINGS: Osseous structures are diffusely osteopenic. There is no evidence of pelvic fracture or diastasis. Mild bilateral degenerative hip arthritis. No pelvic bone lesions are seen. IMPRESSION: No acute fracture or diastasis. Electronically Signed   By: Helyn Numbers M.D.   On: 09/02/2021 04:18   DG Lumbar Spine 2-3 Views  Result Date: 09/02/2021 CLINICAL DATA:  Fall, back pain EXAM: LUMBAR SPINE - 2-3 VIEW COMPARISON:  08/01/2006 FINDINGS: Osseous structures are mildly osteopenic. There is a severe compression deformity of L1 with approximately 60-70% loss of height. No retropulsion. Superior  endplate fracture of L2 with no significant loss of height. Remaining vertebral body height has been preserved. There is progressive anterolisthesis at L5-S1 and development of grade 1 anterolisthesis at L4-5 in keeping with degenerative joint disease. Atherosclerotic calcification noted within the thoracic aorta. Paraspinal soft tissues are unremarkable. IMPRESSION: 1. Severe compression deformity of L1  with approximately 60-70% loss of height. No retropulsion. Lack of paravertebral soft tissue swelling suggests that this may be subacute to remote in nature, however, correlation for point tenderness is recommended. If indicated, MRI examination may better age the fracture. 2. Superior endplate fracture of L2 with no significant loss of height. Electronically Signed   By: Helyn Numbers M.D.   On: 09/02/2021 04:16   DG Thoracic Spine 2 View  Result Date: 09/02/2021 CLINICAL DATA:  Fall yesterday with upper back pain, initial encounter EXAM: THORACIC SPINE 2 VIEWS COMPARISON:  None Available. FINDINGS: Chronic appearing T4 compression fracture is noted. L1 compression fracture is noted as well. No other compression fractures are seen. Pedicles are within normal limits. No paraspinal mass is noted. Osteophytic changes are seen. IMPRESSION: Chronic appearing compression deformities. No acute abnormality noted. Electronically Signed   By: Alcide Clever M.D.   On: 09/02/2021 03:50            LOS: 1 day    Sunnie Nielsen, DO [facility] 09/03/2021, 8:10 AM   Staff may message me via secure chat in Epic  but this may not receive immediate response,  please page for urgent matters!  If 7PM-7AM, please contact night-coverage [email or web address]  Dictation software was used to generate the above note. Typos may occur and escape review, as with typed/written notes. Please contact Dr Lyn Hollingshead directly for clarity if needed.

## 2021-09-03 NOTE — Progress Notes (Signed)
Patient's son Sherrlyn Hock [phone] called per patient's request. Spoke with Dorinda Hill who stated that patient's facility did not notify him of admission.  Updated Ms. Hendrie that her son stated he would be able to come visit her later today.

## 2021-09-03 NOTE — Consult Note (Signed)
   Heart Failure Nurse Navigator Note  HFpEF 60-65%.  Mild left ventricular hypertrophy.  Diastolic parameters were indeterminate.  She presented from an assisted living with complaints of feeling weak and frequent falls.  Comorbidities:  Hypertension Lung cancer Bladder cancer Asthma  Medications:  Atenolol 25 mg daily Furosemide 40 mg IV 2 times a day     Labs:  Sodium 138, potassium 3.6, chloride 102, CO2 29, BUN 20, creatinine 0.89, GFR greater than 60.  Magnesium 2.3. Weight is 83.2 kg Blood pressure 123/58 Intake 240 mL Output 2500 mL  Initial meeting with patient who was lying in bed in no acute distress.  She tells me that she was admitted to the hospital after sustaining a fall.  She tells me that the date is 73, that is she is living in [place] she could not tell me the facility but felt that it was an apartment and described it as a large building.  There were no family members at the bedside.Will attempt to start CHF teaching.  Left education materials at bedside, heart failure teaching booklet, zone magnet, info on low sodium, heart failure and weight chart.  Tresa Endo RN [facility]

## 2021-09-04 ENCOUNTER — Encounter: Payer: Self-pay | Admitting: Internal Medicine

## 2021-09-04 DIAGNOSIS — I509 Heart failure, unspecified: Secondary | ICD-10-CM | POA: Diagnosis not present

## 2021-09-04 DIAGNOSIS — R296 Repeated falls: Secondary | ICD-10-CM | POA: Diagnosis not present

## 2021-09-04 DIAGNOSIS — N952 Postmenopausal atrophic vaginitis: Secondary | ICD-10-CM | POA: Diagnosis not present

## 2021-09-04 DIAGNOSIS — S32009S Unspecified fracture of unspecified lumbar vertebra, sequela: Secondary | ICD-10-CM | POA: Diagnosis not present

## 2021-09-04 LAB — URINE CULTURE: Culture: >=100000 — AB

## 2021-09-04 LAB — CBC
HCT: 41.7 % (ref 36.0–46.0)
Hemoglobin: 14 g/dL (ref 12.0–15.0)
MCH: 31 pg (ref 26.0–34.0)
MCHC: 33.6 g/dL (ref 30.0–36.0)
MCV: 92.5 fL (ref 80.0–100.0)
Platelets: 150 10*3/uL (ref 150–400)
RBC: 4.51 MIL/uL (ref 3.87–5.11)
RDW: 13.1 % (ref 11.5–15.5)
WBC: 7.2 10*3/uL (ref 4.0–10.5)
nRBC: 0 % (ref 0.0–0.2)

## 2021-09-04 LAB — BASIC METABOLIC PANEL
Anion gap: 8 (ref 5–15)
BUN: 23 mg/dL (ref 8–23)
CO2: 31 mmol/L (ref 22–32)
Calcium: 9.3 mg/dL (ref 8.9–10.3)
Chloride: 100 mmol/L (ref 98–111)
Creatinine, Ser: 0.96 mg/dL (ref 0.44–1.00)
GFR, Estimated: 57 mL/min — ABNORMAL LOW (ref >60)
Glucose, Bld: 89 mg/dL (ref 70–99)
Potassium: 3.4 mmol/L — ABNORMAL LOW (ref 3.5–5.1)
Sodium: 139 mmol/L (ref 135–145)

## 2021-09-04 MED ORDER — POTASSIUM CHLORIDE CRYS ER 20 MEQ PO TBCR
20.0000 meq | EXTENDED_RELEASE_TABLET | Freq: Once | ORAL | Status: AC
  Administered 2021-09-04: 20 meq via ORAL
  Filled 2021-09-04: qty 1

## 2021-09-04 MED ORDER — OXYCODONE HCL 5 MG PO TABS
5.0000 mg | ORAL_TABLET | ORAL | 0 refills | Status: DC | PRN
Start: 2021-09-04 — End: 2022-01-17

## 2021-09-04 MED ORDER — CEFUROXIME AXETIL 250 MG PO TABS
250.0000 mg | ORAL_TABLET | Freq: Two times a day (BID) | ORAL | 0 refills | Status: AC
Start: 2021-09-04 — End: 2021-09-08

## 2021-09-04 MED ORDER — FUROSEMIDE 20 MG PO TABS: 20.0000 mg | ORAL_TABLET | Freq: Every day | ORAL | 0 refills | Status: DC

## 2021-09-04 NOTE — TOC Transition Note (Addendum)
Transition of Care [facility]) - CM/SW Discharge Note   Patient Details  Name: Jane Perez MRN: [id number] Date of Birth: 1930-08-11  [facility]) CM/SW Contact:  Truddie Hidden, RN Phone Number: 09/04/2021, 4:00 PM   Clinical Narrative:    12:00Spoke with son to advise od discharge plan to [facility] [address] Spoke with Deanna Artis at facility to confirm dishcarge. Arrange facility transportation. FL2 sent to  [phone] HH referral sent and accepted by [place] at [place]. TOC siging off.          Patient Goals and CMS Choice        Discharge Placement                       Discharge Plan and Services                                     Social Determinants of Health (SDOH) Interventions     Readmission Risk Interventions     No data to display

## 2021-09-20 ENCOUNTER — Ambulatory Visit: Payer: Medicare Other | Admitting: Family

## 2021-09-20 ENCOUNTER — Telehealth: Payer: Self-pay | Admitting: Family

## 2021-09-20 NOTE — Telephone Encounter (Signed)
Patient did not show for her initial Heart Failure Clinic appointment on 09/20/21 even after [facility] confirmed it. Will attempt to reschedule.

## 2021-09-20 NOTE — Progress Notes (Deleted)
   Patient ID: Jane Perez, female    DOB: Jun 25, 1930, 88 y.o.   MRN: [id number]  HPI  Jane Perez is a 88 y/o female with a history of   Echo report from 09/02/21 reviewed and showed an EF of 60-65% along with mild LVH, trivial MR and mild AR.   Review of Systems    Physical Exam     Assessment & Plan:  1: Chronic heart failure with preserved ejection fraction with structural changes (LVH)- - NYHA class

## 2021-12-09 ENCOUNTER — Emergency Department: Payer: Medicare Other

## 2021-12-09 ENCOUNTER — Other Ambulatory Visit: Payer: Self-pay

## 2021-12-09 ENCOUNTER — Encounter: Payer: Self-pay | Admitting: Emergency Medicine

## 2021-12-09 ENCOUNTER — Emergency Department
Admission: EM | Admit: 2021-12-09 | Discharge: 2021-12-09 | Disposition: A | Discharge status: Home / Self Care | Payer: Medicare Other | Attending: Emergency Medicine | Admitting: Emergency Medicine

## 2021-12-09 DIAGNOSIS — W010XXA Fall on same level from slipping, tripping and stumbling without subsequent striking against object, initial encounter: Secondary | ICD-10-CM | POA: Insufficient documentation

## 2021-12-09 DIAGNOSIS — R4182 Altered mental status, unspecified: Secondary | ICD-10-CM | POA: Diagnosis not present

## 2021-12-09 DIAGNOSIS — Z8551 Personal history of malignant neoplasm of bladder: Secondary | ICD-10-CM | POA: Diagnosis not present

## 2021-12-09 DIAGNOSIS — N390 Urinary tract infection, site not specified: Secondary | ICD-10-CM | POA: Diagnosis not present

## 2021-12-09 DIAGNOSIS — Z85118 Personal history of other malignant neoplasm of bronchus and lung: Secondary | ICD-10-CM | POA: Diagnosis not present

## 2021-12-09 DIAGNOSIS — S0512XA Contusion of eyeball and orbital tissues, left eye, initial encounter: Secondary | ICD-10-CM | POA: Insufficient documentation

## 2021-12-09 DIAGNOSIS — S0592XA Unspecified injury of left eye and orbit, initial encounter: Secondary | ICD-10-CM | POA: Diagnosis present

## 2021-12-09 LAB — CBC WITH DIFFERENTIAL/PLATELET
Abs Immature Granulocytes: 0.05 10*3/uL (ref 0.00–0.07)
Basophils Absolute: 0 10*3/uL (ref 0.0–0.1)
Basophils Relative: 0 %
Eosinophils Absolute: 0.4 10*3/uL (ref 0.0–0.5)
Eosinophils Relative: 5 %
HCT: 37.3 % (ref 36.0–46.0)
Hemoglobin: 11.9 g/dL — ABNORMAL LOW (ref 12.0–15.0)
Immature Granulocytes: 1 %
Lymphocytes Relative: 13 %
Lymphs Abs: 0.9 10*3/uL (ref 0.7–4.0)
MCH: 31 pg (ref 26.0–34.0)
MCHC: 31.9 g/dL (ref 30.0–36.0)
MCV: 97.1 fL (ref 80.0–100.0)
Monocytes Absolute: 0.6 10*3/uL (ref 0.1–1.0)
Monocytes Relative: 8 %
Neutro Abs: 5.5 10*3/uL (ref 1.7–7.7)
Neutrophils Relative %: 73 %
Platelets: 211 10*3/uL (ref 150–400)
RBC: 3.84 MIL/uL — ABNORMAL LOW (ref 3.87–5.11)
RDW: 14.2 % (ref 11.5–15.5)
WBC: 7.4 10*3/uL (ref 4.0–10.5)
nRBC: 0 % (ref 0.0–0.2)

## 2021-12-09 LAB — URINALYSIS, COMPLETE (UACMP) WITH MICROSCOPIC
Bilirubin Urine: NEGATIVE
Glucose, UA: NEGATIVE mg/dL
Hgb urine dipstick: NEGATIVE
Ketones, ur: NEGATIVE mg/dL
Nitrite: POSITIVE — AB
Protein, ur: NEGATIVE mg/dL
Specific Gravity, Urine: 1.006 (ref 1.005–1.030)
pH: 5 (ref 5.0–8.0)

## 2021-12-09 LAB — COMPREHENSIVE METABOLIC PANEL
ALT: 18 U/L (ref 0–44)
AST: 30 U/L (ref 15–41)
Albumin: 3.1 g/dL — ABNORMAL LOW (ref 3.5–5.0)
Alkaline Phosphatase: 106 U/L (ref 38–126)
Anion gap: 10 (ref 5–15)
BUN: 22 mg/dL (ref 8–23)
CO2: 27 mmol/L (ref 22–32)
Calcium: 9.5 mg/dL (ref 8.9–10.3)
Chloride: 102 mmol/L (ref 98–111)
Creatinine, Ser: 1.02 mg/dL — ABNORMAL HIGH (ref 0.44–1.00)
GFR, Estimated: 53 mL/min — ABNORMAL LOW (ref >60)
Glucose, Bld: 132 mg/dL — ABNORMAL HIGH (ref 70–99)
Potassium: 4 mmol/L (ref 3.5–5.1)
Sodium: 139 mmol/L (ref 135–145)
Total Bilirubin: 1 mg/dL (ref 0.3–1.2)
Total Protein: 6.9 g/dL (ref 6.5–8.1)

## 2021-12-09 LAB — TROPONIN I (HIGH SENSITIVITY): Troponin I (High Sensitivity): 9 ng/L (ref <18)

## 2021-12-09 LAB — LACTIC ACID, PLASMA: Lactic Acid, Venous: 1.7 mmol/L (ref 0.5–1.9)

## 2021-12-09 MED ORDER — CEPHALEXIN 500 MG PO CAPS: 500.0000 mg | ORAL_CAPSULE | Freq: Four times a day (QID) | ORAL | 0 refills | Status: AC

## 2021-12-09 MED ORDER — SODIUM CHLORIDE 0.9 % IV SOLN
1.0000 g | Freq: Once | INTRAVENOUS | Status: AC
  Administered 2021-12-09: 1 g via INTRAVENOUS
  Filled 2021-12-09: qty 10

## 2021-12-09 MED ORDER — CEPHALEXIN 500 MG PO CAPS: 500.0000 mg | ORAL_CAPSULE | Freq: Two times a day (BID) | ORAL | 0 refills | Status: DC

## 2021-12-09 NOTE — Discharge Instructions (Addendum)
Take antibiotics as prescribed for urinary tract infection.  Thank you for choosing [place] for your health care today!  Please see your primary doctor this week for a follow up appointment.   If you do not have a primary doctor call the following clinics to establish care:  If you have insurance:  [facility] [phone] [place] [address]   [facility]  [phone] [place] [address]   If you do not have insurance:  [facility]  [phone] [address] [place] [address]  Sometimes, in the early stages of certain disease courses it is difficult to detect in the emergency department evaluation -- so, it is important that you continue to monitor your symptoms and call your doctor right away or return to the emergency department if you develop any new or worsening symptoms.  It was my pleasure to care for you today.   [place] Brunette Jacelyn Grip, MD

## 2021-12-09 NOTE — ED Triage Notes (Signed)
Patient to ED via [facility] from the [facility] for Jane Perez. Facility concerned for possible UTI due to urinary incontinence. Hx of dementia.

## 2021-12-09 NOTE — ED Notes (Signed)
Called [facility] for transport to [facility] [place]  1320

## 2021-12-09 NOTE — ED Notes (Signed)
Patients glasses noted to be broken upon arrival to ED. Patient states they broken during fall at her facility yesterday.

## 2021-12-09 NOTE — ED Provider Notes (Signed)
[facility] Provider Note    Event Date/Time   First MD Initiated Contact with Patient 12/09/21 1142     (approximate)   History   Altered Mental Status   HPI  Jane Perez is a 88 y.o. female   Past medical history of ambulation dysfunction on roller walker, frequent falls, lung cancer status post lobectomy, bladder cancer, history of DVT and PE (no anticoagulation on med list ) who presents to the emergency department from her nursing facility due altered mental status concern for urinary tract infection from facility.  No fevers, chills, nausea or vomiting, and the patient denies any symptoms currently.  I found her glasses to be broken and she has a small bruise to the left side of her face, she states from a fall yesterday when she tripped over something.  She has no other complaints now, feels well.  On review of systems, denies chest pain, shortness of breath, cough, fever, chills, nausea or vomiting, diarrhea, abdominal pain, bleeding.  History was obtained via patient and review of external medical notes including discharge summary dated 09/04/2021      Physical Exam   Triage Vital Signs: ED Triage Vitals  Enc Vitals Group     BP 12/09/21 1140 (!) 126/101     Pulse Rate 12/09/21 1140 85     Resp 12/09/21 1140 18     Temp 12/09/21 1140 98 F (36.7 C)     Temp Source 12/09/21 1140 Oral     SpO2 12/09/21 1140 100 %     Weight 12/09/21 1141 163 lb 2.3 oz (74 kg)     Height 12/09/21 1141 5\' 3"  (1.6 m)     Head Circumference --      Peak Flow --      Pain Score 12/09/21 1141 0     Pain Loc --      Pain Edu? --      Excl. in [place]? --     Most recent vital signs: Vitals:   12/09/21 1140  BP: (!) 126/101  Pulse: 85  Resp: 18  Temp: 98 F (36.7 C)  SpO2: 100%    General: Awake, no distress.  Pleasant and cooperative, disoriented.  Oriented only to self and situation. CV:  Good peripheral perfusion.  Bilateral lower extremity  lymphedema, chronic appearing, no cellulitic changes, neurovascular intact, normotensive and normal rate Resp:  Normal effort.  Lungs clear to auscultation bilaterally, no hypoxemia Abd:  No distention.  Nontender to palpation. Other:  Broken glasses and small abrasion/ecchymosis under her left eye, no other significant trauma noted.  Motor or sensory intact, no facial asymmetry.   ED Results / Procedures / Treatments   Labs (all labs ordered are listed, but only abnormal results are displayed) Labs Reviewed  COMPREHENSIVE METABOLIC PANEL - Abnormal; Notable for the following components:      Result Value   Glucose, Bld 132 (*)    Creatinine, Ser 1.02 (*)    Albumin 3.1 (*)    GFR, Estimated 53 (*)    All other components within normal limits  CBC WITH DIFFERENTIAL/PLATELET - Abnormal; Notable for the following components:   RBC 3.84 (*)    Hemoglobin 11.9 (*)    All other components within normal limits  URINALYSIS, COMPLETE (UACMP) WITH MICROSCOPIC - Abnormal; Notable for the following components:   Color, Urine YELLOW (*)    APPearance HAZY (*)    Nitrite POSITIVE (*)    Leukocytes,Ua TRACE (*)  Bacteria, UA MANY (*)    All other components within normal limits  CULTURE, BLOOD (ROUTINE X 2)  CULTURE, BLOOD (ROUTINE X 2)  URINE CULTURE  LACTIC ACID, PLASMA  LACTIC ACID, PLASMA  TROPONIN I (HIGH SENSITIVITY)     I reviewed labs and they are notable for nitrite positive urinalysis with bacteria  EKG  ED ECG REPORT I, Lucillie Garfinkel, the attending physician, personally viewed and interpreted this ECG.   Date: 12/09/2021  EKG Time: 1143  Rate: 83  Rhythm: normal sinus rhythm  Axis: nl  Intervals:incomplete rbbb  ST&T Change: Ischemic changes, baseline artifact    RADIOLOGY I independently reviewed and interpreted CT scan of the head and see no obvious hemorrhage or midline shift   PROCEDURES:  Critical Care performed: No  Procedures   MEDICATIONS ORDERED  IN ED: Medications  cefTRIAXone (ROCEPHIN) 1 g in sodium chloride 0.9 % 100 mL IVPB (has no administration in time range)     IMPRESSION / MDM / ASSESSMENT AND PLAN / ED COURSE  I reviewed the triage vital signs and the nursing notes.                              Differential diagnosis includes, but is not limited to, cranial bleeding, altered mental status in the setting of worsening dementia, infectious etiologies, metabolic derangements.  CT scan of the head to assess for intracranial bleeding.  Infection work-up including basic labs, urinalysis, chest x-ray.  Urinalysis with nitrite+ and bacteria indicative of urinary infection.  Dispo: After careful consideration of this patient's presentation, medical and social risk factors, and evaluation in the emergency department I engaged in shared decision making with the patient and/or their representative to consider admission or observation and this patient was ultimately discharged to because has been in stable condition without medical complaints and evidence of urinary tract infection on her urinalysis, given IV ceftriaxone first dose in the emergency department and plan for discharge with outpatient antibiotics and PMD follow-up.   Patient's presentation is most consistent with acute presentation with potential threat to life or bodily function.       FINAL CLINICAL IMPRESSION(S) / ED DIAGNOSES   Final diagnoses:  Lower urinary tract infectious disease     Rx / DC Orders   ED Discharge Orders          Ordered    cephALEXin (KEFLEX) 500 MG capsule  2 times daily        12/09/21 1239             Note:  This document was prepared using Dragon voice recognition software and may include unintentional dictation errors.    Lucillie Garfinkel, MD 12/10/21 [phone]

## 2021-12-11 ENCOUNTER — Non-Acute Institutional Stay: Payer: Medicare Other | Admitting: Student

## 2021-12-11 DIAGNOSIS — R609 Edema, unspecified: Secondary | ICD-10-CM

## 2021-12-11 DIAGNOSIS — Z515 Encounter for palliative care: Secondary | ICD-10-CM

## 2021-12-11 DIAGNOSIS — N390 Urinary tract infection, site not specified: Secondary | ICD-10-CM

## 2021-12-11 LAB — URINE CULTURE: Culture: >=100000 — AB

## 2021-12-11 NOTE — Progress Notes (Signed)
[facility] Pall[facility] Consult Note Telephone: [phone]  Fax: [phone]    Date of encounter: 12/11/21 1:00 PM PATIENT NAME: Jane Perez [id number]   [phone] (home)  DOB: [birth year] MRN: [id number] PRIMARY CARE PROVIDER:    Tracie Harrier, MD,  [address] [place] [address] [place] PROVIDER:   Tracie Harrier, [place],  [place] [address] [phone]  RESPONSIBLE PARTY:    Contact Information     Name Relation Home Work Mobile   Sharlyne Pacas  [phone]        I met face to face with patient in the facility. Palliative Care was asked to follow this patient by consultation request of  Tracie Harrier, MD to address advance care planning and complex medical decision making. This is a follow up visit.                                   ASSESSMENT AND PLAN / RECOMMENDATIONS:   Advance Care Planning/Goals of Care: Goals include to maximize quality of life and symptom management. Patient/health care surrogate gave his/her permission to discuss. CODE STATUS: Full Code  Education provided on Palliative Medicine.   Symptom Management/Plan:  LE edema-due to heart failure. Continue furosemide 20 mg daily, elevate legs. She had been receiving HH SN for leg wraps. It is unclear if HH is still involved. She does have PRN to wear compression hose daily.   UTI-patient currently on keftex 500 mg QID x 7 days for UTI.  Follow up Palliative Care Visit: Palliative care will continue to follow for complex medical decision making, advance care planning, and clarification of goals. Return in 6-8 weeks or prn.   This visit was coded based on medical decision making (MDM).  PPS: 50%  HOSPICE ELIGIBILITY/DIAGNOSIS: TBD  Chief Complaint: Palliative Medicine follow up visit.   HISTORY OF PRESENT  ILLNESS:  Jane Perez is a 88 y.o. year old female  with OSA, polyneuropathy, asthma, CKD, colitis, migraines, peripheral edema, heart failure, history of right lung cancer and bladder cancer.     Patient resides at the [facility]. She denies pain, shortness of breath. She endorses a good appetite. She has 2+ LE edema. She is now receiving furosemide d/t heart failure, peripheral edema. She had been receiving [facility] SN services for leg wraps. Patient started on Eliquis 10/05/21 due to DVT. She has a purple bruise under her left eye; she had a fall recently. She endorses good appetite. Patient seen in ED on 12/09/21 due altered mental status; she was started on keflex for UTI. She denies any urinary complaints today.   History obtained from review of EMR, discussion with primary team, and interview with family, facility staff/caregiver and/or Jane Perez.  I reviewed available labs, medications, imaging, studies and related documents from the EMR.  Records reviewed and summarized above.   ROS  A 10-point review of systems is negative, except for the pertinent positives and negatives detailed in the HPI.   Physical Exam: Pulse 76, resp 18, b/p 128/68, sats 93% on room air Constitutional: NAD General: frail appearing EYES: anicteric sclera, lids intact, no discharge  ENMT: intact hearing, oral mucous membranes moist CV: S1S2, RRR, 2+ LE edema Pulmonary: LCTA, no increased work of breathing, no cough, room air Abdomen: normo-active BS +  4 quadrants, soft and non tender, no ascites GU: deferred MSK: moves all extremities, ambulatory Skin: warm and dry, no rashes or wounds on visible skin Neuro: + generalized weakness, + cognitive impairment Psych: non-anxious affect, A and O x 2 Hem/lymph/immuno: no widespread bruising   Thank you for the opportunity to participate in the care of Jane Perez.  The palliative care team will continue to follow. Please call our office at [phone] if we can  be of additional assistance.   Ezekiel Slocumb, NP   COVID-19 PATIENT SCREENING TOOL Asked and negative response unless otherwise noted:   Have you had symptoms of covid, tested positive or been in contact with someone with symptoms/positive test in the past 5-10 days? No

## 2021-12-14 LAB — CULTURE, BLOOD (ROUTINE X 2)
Culture: NO GROWTH
Culture: NO GROWTH
Special Requests: ADEQUATE
Special Requests: ADEQUATE

## 2021-12-27 ENCOUNTER — Encounter: Payer: Self-pay | Admitting: Emergency Medicine

## 2021-12-27 ENCOUNTER — Other Ambulatory Visit: Payer: Self-pay

## 2021-12-27 ENCOUNTER — Emergency Department
Admission: EM | Admit: 2021-12-27 | Discharge: 2021-12-27 | Disposition: A | Discharge status: Home / Self Care | Payer: Medicare Other | Attending: Student in an Organized Health Care Education/Training Program | Admitting: Student in an Organized Health Care Education/Training Program

## 2021-12-27 ENCOUNTER — Emergency Department: Payer: Medicare Other

## 2021-12-27 DIAGNOSIS — R296 Repeated falls: Secondary | ICD-10-CM

## 2021-12-27 DIAGNOSIS — Z8551 Personal history of malignant neoplasm of bladder: Secondary | ICD-10-CM | POA: Diagnosis not present

## 2021-12-27 DIAGNOSIS — F0390 Unspecified dementia without behavioral disturbance: Secondary | ICD-10-CM | POA: Insufficient documentation

## 2021-12-27 DIAGNOSIS — Z85118 Personal history of other malignant neoplasm of bronchus and lung: Secondary | ICD-10-CM | POA: Insufficient documentation

## 2021-12-27 DIAGNOSIS — I10 Essential (primary) hypertension: Secondary | ICD-10-CM | POA: Insufficient documentation

## 2021-12-27 DIAGNOSIS — W01198A Fall on same level from slipping, tripping and stumbling with subsequent striking against other object, initial encounter: Secondary | ICD-10-CM | POA: Diagnosis not present

## 2021-12-27 DIAGNOSIS — S0990XA Unspecified injury of head, initial encounter: Secondary | ICD-10-CM | POA: Diagnosis present

## 2021-12-27 LAB — URINALYSIS, ROUTINE W REFLEX MICROSCOPIC
Bacteria, UA: NONE SEEN
Bilirubin Urine: NEGATIVE
Glucose, UA: NEGATIVE mg/dL
Ketones, ur: NEGATIVE mg/dL
Leukocytes,Ua: NEGATIVE
Nitrite: NEGATIVE
Protein, ur: NEGATIVE mg/dL
Specific Gravity, Urine: 1.006 (ref 1.005–1.030)
pH: 5 (ref 5.0–8.0)

## 2021-12-27 NOTE — ED Notes (Signed)
[facility]  CALLED  FOR  TRANSPORT  TO  [facility]  [place]

## 2021-12-27 NOTE — Discharge Instructions (Signed)
Patient woke with a walker for added support and stability.  CT scan of head and cervical spine are negative for any new fractures.  CT did show healing fractures from previous falls.  Urinalysis was negative for infection. Patient is to continue with her regular medications as prescribed by her doctor.

## 2021-12-27 NOTE — ED Triage Notes (Signed)
Arrives via [facility] from the [facility] [place].  C?O fall this morning.  Unwitnessed.  Per EMS report, patient states she hit back of head.  No LOC.  No obvious injury.  Facility states patient is at baseline, Alert and oriented to self, patient with hx of dementia.  VS wnl.

## 2021-12-27 NOTE — ED Provider Notes (Signed)
Jane Perez Provider Note    Event Date/Time   First MD Initiated Contact with Patient 12/27/21 1012     (approximate)   History   Fall   HPI  Jane Perez is a 88 y.o. female   presents to the ED via EMS from the [facility] after an unwitnessed reported fall per patient.  Patient told EMS that she hit her head.  No obvious injury was noted and facility reported that patient is at her baseline and has a history of dementia.  Patient has a history of recurrent UTIs, hypertension, DVT, lung cancer, bladder cancer, and kidney disease.      Physical Exam   Triage Vital Signs: ED Triage Vitals  Enc Vitals Group     BP 12/27/21 0912 (!) 147/97     Pulse Rate 12/27/21 0912 76     Resp 12/27/21 0912 18     Temp 12/27/21 0912 98.2 F (36.8 C)     Temp Source 12/27/21 0912 Oral     SpO2 12/27/21 0912 95 %     Weight 12/27/21 0841 163 lb 2.3 oz (74 kg)     Height 12/27/21 0841 5\' 3"  (1.6 m)     Head Circumference --      Peak Flow --      Pain Score 12/27/21 0840 0     Pain Loc --      Pain Edu? --      Excl. in [place]? --     Most recent vital signs: Vitals:   12/27/21 1208 12/27/21 1230  BP: 114/66 (!) 108/52  Pulse: 81 71  Resp: 18 16  Temp:    SpO2: 97% 98%     General: Awake, no distress.  Sleeping but easily arousable.  Talkative.  Patient is oriented to self but not to place or time. CV:  Good peripheral perfusion.  Resp:  Normal effort.  Lungs are clear bilaterally. Abd:  No distention.  Soft, nontender. Other:  No abrasions, lacerations or ecchymosis noted to torso or upper extremities.  Patient is alert and talkative.  No gross deformity of upper or lower extremities.  Patient is able to stand with assistance.  There is some chronic vascular changes to the lower extremities.  Patient is pleasant and talkative.   ED Results / Procedures / Treatments   Labs (all labs ordered are listed, but only abnormal results are  displayed) Labs Reviewed  URINALYSIS, ROUTINE W REFLEX MICROSCOPIC - Abnormal; Notable for the following components:      Result Value   Color, Urine YELLOW (*)    APPearance CLEAR (*)    Hgb urine dipstick MODERATE (*)    All other components within normal limits     RADIOLOGY CT head and cervical spine per radiologist  No acute intracranial abnormality.  2. No acute fracture or traumatic malalignment of the cervical  spine.  3. Subacute mild superior endplate compression fractures of T2 and  T3 with signs of healing.  4. Healing mildly displaced fracture of the T2 spinous process.  5. Healing mildly displaced fracture of the right clavicular head     PROCEDURES:  Critical Care performed:   Procedures   MEDICATIONS ORDERED IN ED: Medications - No data to display   IMPRESSION / MDM / [place] / ED COURSE  I reviewed the triage vital signs and the nursing notes.   Differential diagnosis includes, but is not limited to, unwitnessed fall without  reported known injury.  Head injury, cervical injury, contusion, abrasions, fractures.  88 year old female was brought to the ED by EMS from the [facility] with a reported unwitnessed fall.  Facility reports that patient is at her normal baseline and has a history of dementia.  Patient is very pleasant and talkative but oriented to herself but not place or time.  No obvious deformity or suspicion of fracture is present.  CT head and cervical spine are negative for acute fracture but patient does have some recent fractures that appear to be healing.  Report was given to the [facility] where patient will be returning.  They inform nursing staff that patient does have a walker there.  They are reassured that CT and cervical spine does not show any new abnormalities.  She is to continue with her regular medication per her PCP.      Patient's presentation is most consistent with acute complicated illness / injury requiring diagnostic  workup.  FINAL CLINICAL IMPRESSION(S) / ED DIAGNOSES   Final diagnoses:  Unwitnessed fall     Rx / DC Orders   ED Discharge Orders     None        Note:  This document was prepared using Dragon voice recognition software and may include unintentional dictation errors.   Jane Hai, PA-C 12/27/21 1443    Jane Lot, MD 12/27/21 [phone]

## 2021-12-27 NOTE — ED Notes (Signed)
Patient was sleeping when this writer came in the room, but easily awakened. Patient was informed of discharge plans and had no questions.

## 2021-12-27 NOTE — ED Notes (Signed)
Report given to [employer] at [facility]. [facility] stated that transport was not available and Patient would need to go back by EMS. Perese stated the patient has a walker at the facility. Letitia Neri, PA-C aware.

## 2021-12-27 NOTE — ED Notes (Signed)
Patient is confused, unable to weight-bear without two total assists. Patient was assisted to hallway bathroom. Patient had been incontinent to urine and stool in brief, but was able to give a specimen.

## 2022-01-16 ENCOUNTER — Other Ambulatory Visit: Payer: Self-pay

## 2022-01-16 ENCOUNTER — Emergency Department: Payer: Medicare Other

## 2022-01-16 ENCOUNTER — Inpatient Hospital Stay
Admission: EM | Admit: 2022-01-16 | Discharge: 2022-01-29 | DRG: 177 | Disposition: A | Discharge status: Skilled Nursing Facility | Payer: Medicare Other | Source: Skilled Nursing Facility | Attending: Osteopathic Medicine | Admitting: Osteopathic Medicine

## 2022-01-16 DIAGNOSIS — I10 Essential (primary) hypertension: Secondary | ICD-10-CM | POA: Diagnosis present

## 2022-01-16 DIAGNOSIS — Z86711 Personal history of pulmonary embolism: Secondary | ICD-10-CM

## 2022-01-16 DIAGNOSIS — Z9181 History of falling: Secondary | ICD-10-CM

## 2022-01-16 DIAGNOSIS — F0390 Unspecified dementia without behavioral disturbance: Secondary | ICD-10-CM | POA: Diagnosis present

## 2022-01-16 DIAGNOSIS — Z902 Acquired absence of lung [part of]: Secondary | ICD-10-CM

## 2022-01-16 DIAGNOSIS — Z85118 Personal history of other malignant neoplasm of bronchus and lung: Secondary | ICD-10-CM

## 2022-01-16 DIAGNOSIS — R001 Bradycardia, unspecified: Secondary | ICD-10-CM | POA: Diagnosis present

## 2022-01-16 DIAGNOSIS — W19XXXA Unspecified fall, initial encounter: Secondary | ICD-10-CM | POA: Diagnosis present

## 2022-01-16 DIAGNOSIS — Z888 Allergy status to other drugs, medicaments and biological substances status: Secondary | ICD-10-CM

## 2022-01-16 DIAGNOSIS — Z90710 Acquired absence of both cervix and uterus: Secondary | ICD-10-CM

## 2022-01-16 DIAGNOSIS — S0083XA Contusion of other part of head, initial encounter: Secondary | ICD-10-CM | POA: Diagnosis present

## 2022-01-16 DIAGNOSIS — I959 Hypotension, unspecified: Secondary | ICD-10-CM | POA: Diagnosis present

## 2022-01-16 DIAGNOSIS — J189 Pneumonia, unspecified organism: Secondary | ICD-10-CM | POA: Diagnosis present

## 2022-01-16 DIAGNOSIS — S42001A Fracture of unspecified part of right clavicle, initial encounter for closed fracture: Secondary | ICD-10-CM

## 2022-01-16 DIAGNOSIS — Z9981 Dependence on supplemental oxygen: Secondary | ICD-10-CM

## 2022-01-16 DIAGNOSIS — Z87440 Personal history of urinary (tract) infections: Secondary | ICD-10-CM

## 2022-01-16 DIAGNOSIS — A419 Sepsis, unspecified organism: Secondary | ICD-10-CM

## 2022-01-16 DIAGNOSIS — Z801 Family history of malignant neoplasm of trachea, bronchus and lung: Secondary | ICD-10-CM

## 2022-01-16 DIAGNOSIS — R0902 Hypoxemia: Secondary | ICD-10-CM

## 2022-01-16 DIAGNOSIS — Z8551 Personal history of malignant neoplasm of bladder: Secondary | ICD-10-CM

## 2022-01-16 DIAGNOSIS — Z800 Family history of malignant neoplasm of digestive organs: Secondary | ICD-10-CM

## 2022-01-16 DIAGNOSIS — M4854XA Collapsed vertebra, not elsewhere classified, thoracic region, initial encounter for fracture: Secondary | ICD-10-CM | POA: Diagnosis present

## 2022-01-16 DIAGNOSIS — Z841 Family history of disorders of kidney and ureter: Secondary | ICD-10-CM

## 2022-01-16 DIAGNOSIS — N390 Urinary tract infection, site not specified: Secondary | ICD-10-CM | POA: Diagnosis present

## 2022-01-16 DIAGNOSIS — J129 Viral pneumonia, unspecified: Secondary | ICD-10-CM

## 2022-01-16 DIAGNOSIS — U071 COVID-19: Secondary | ICD-10-CM | POA: Diagnosis not present

## 2022-01-16 DIAGNOSIS — Z8249 Family history of ischemic heart disease and other diseases of the circulatory system: Secondary | ICD-10-CM

## 2022-01-16 DIAGNOSIS — Z882 Allergy status to sulfonamides status: Secondary | ICD-10-CM

## 2022-01-16 DIAGNOSIS — J1282 Pneumonia due to coronavirus disease 2019: Secondary | ICD-10-CM | POA: Diagnosis present

## 2022-01-16 DIAGNOSIS — J9601 Acute respiratory failure with hypoxia: Secondary | ICD-10-CM

## 2022-01-16 DIAGNOSIS — Z7901 Long term (current) use of anticoagulants: Secondary | ICD-10-CM

## 2022-01-16 DIAGNOSIS — Z880 Allergy status to penicillin: Secondary | ICD-10-CM

## 2022-01-16 DIAGNOSIS — R296 Repeated falls: Secondary | ICD-10-CM | POA: Diagnosis present

## 2022-01-16 DIAGNOSIS — Z881 Allergy status to other antibiotic agents status: Secondary | ICD-10-CM

## 2022-01-16 DIAGNOSIS — Z86718 Personal history of other venous thrombosis and embolism: Secondary | ICD-10-CM

## 2022-01-16 NOTE — ED Notes (Signed)
Patient transported to CT 

## 2022-01-16 NOTE — ED Notes (Signed)
Two RNs attempted IV access x5 without success.  IV team notified by Charge RN.

## 2022-01-16 NOTE — ED Provider Notes (Signed)
[facility] Provider Note    None    (approximate)   History   Fall   HPI  Jane Perez is a 88 y.o. female with history of DVT and PE on anticoagulation, hypertension, recurrent UTIs and frequent falls who comes in with concerns for a recurrent fall.  Patient was found laying down flat on the ground at her facility.  She has bruising noted on her right forehead.  She is not sure how she fell she just reports that sometimes it happens.  She does report a worsening cough that she states is just from the fall.  Her oxygen levels were noted to be in the 90s so patient was placed on 2 L of oxygen.  On review of records patient has been admitted in the past in June 2023 and given some Lasix for some CHF.  Patient also has had recurrent UTIs  Patient's last Tdap was in 2022  Physical Exam   Triage Vital Signs: ED Triage Vitals [01/16/22 2228]  Enc Vitals Group     BP (!) 101/59     Pulse Rate 80     Resp 16     Temp 98.4 F (36.9 C)     Temp Source Oral     SpO2 90 %     Weight      Height      Head Circumference      Peak Flow      Pain Score      Pain Loc      Pain Edu?      Excl. in [place]?     Most recent vital signs: Vitals:   01/16/22 2228  BP: (!) 101/59  Pulse: 80  Resp: 16  Temp: 98.4 F (36.9 C)  SpO2: 90%     General: Awake, no distress.  CV:  Good peripheral perfusion.  Resp:  Normal effort.  Abd:  No distention.  Other:  Abdomen is soft and nontender.  Her legs are wrapped with lymphedema wraps.  She is able to lift both legs up off the bed.  She denies any hip pain.  She has no pain in her arms and moving them well.  She is got an abrasion of her right forehead but no nasal bone tenderness or facial tenderness   ED Results / Procedures / Treatments   Labs (all labs ordered are listed, but only abnormal results are displayed) Labs Reviewed  RESP PANEL BY RT-PCR (FLU A&B, COVID) ARPGX2  CBC WITH DIFFERENTIAL/PLATELET   COMPREHENSIVE METABOLIC PANEL  URINALYSIS, ROUTINE W REFLEX MICROSCOPIC  BRAIN NATRIURETIC PEPTIDE  TROPONIN I (HIGH SENSITIVITY)     EKG  My interpretation of EKG:  Normal sinus rate of 77 without any ST elevation or T wave inversions, normal intervals  RADIOLOGY Pending  PROCEDURES:  Critical Care performed: yes .1-3 Lead EKG Interpretation  Performed by: Vanessa [place], MD Authorized by: Vanessa [place], MD     Interpretation: normal     ECG rate:  80   ECG rate assessment: normal     Rhythm: sinus rhythm     Ectopy: none     Conduction: normal   .Critical Care  Performed by: Vanessa Hammonton, MD Authorized by: Vanessa Herlong, MD   Critical care provider statement:    Critical care time (minutes):  30   Critical care was necessary to treat or prevent imminent or life-threatening deterioration of the following conditions:  Respiratory failure  Critical care was time spent personally by me on the following activities:  Development of treatment plan with patient or surrogate, discussions with consultants, evaluation of patient's response to treatment, examination of patient, ordering and review of laboratory studies, ordering and review of radiographic studies, ordering and performing treatments and interventions, pulse oximetry, re-evaluation of patient's condition and review of old [facility] ED: Medications - No data to display   IMPRESSION / MDM / [place] / ED COURSE  I reviewed the triage vital signs and the nursing notes.   Patient's presentation is most consistent with acute presentation with potential threat to life or bodily function.   Patient comes in with hypoxia with oxygen levels between 89 to 90% after having a fall with a very deep cough noted on examination.  Patient placed on 2 L of oxygen and work-up was ordered to evaluate for pneumonia, CHF, ACS, COVID.  Given the concern for trauma to the head and possible  intracranial hemorrhage have also ordered a CT head and CT cervical.  Patient will be handed off to oncoming team pending labs, CT imaging  The patient is on the cardiac monitor to evaluate for evidence of arrhythmia and/or significant heart rate changes.      FINAL CLINICAL IMPRESSION(S) / ED DIAGNOSES   Final diagnoses:  Acute respiratory failure with hypoxia ([place])  Fall, initial encounter     Rx / DC Orders   ED Discharge Orders     None        Note:  This document was prepared using Dragon voice recognition software and may include unintentional dictation errors.   Vanessa [place], MD 01/16/22 [phone]

## 2022-01-16 NOTE — ED Triage Notes (Deleted)
Daughter reports slurred speech that began at 830pm; here recently for CVA; no other neuro deficits reported

## 2022-01-17 ENCOUNTER — Emergency Department: Payer: Medicare Other

## 2022-01-17 ENCOUNTER — Encounter: Payer: Self-pay | Admitting: Student in an Organized Health Care Education/Training Program

## 2022-01-17 DIAGNOSIS — J1282 Pneumonia due to coronavirus disease 2019: Secondary | ICD-10-CM | POA: Diagnosis present

## 2022-01-17 DIAGNOSIS — Z87440 Personal history of urinary (tract) infections: Secondary | ICD-10-CM | POA: Diagnosis not present

## 2022-01-17 DIAGNOSIS — Z8249 Family history of ischemic heart disease and other diseases of the circulatory system: Secondary | ICD-10-CM | POA: Diagnosis not present

## 2022-01-17 DIAGNOSIS — R296 Repeated falls: Secondary | ICD-10-CM | POA: Diagnosis present

## 2022-01-17 DIAGNOSIS — A419 Sepsis, unspecified organism: Secondary | ICD-10-CM | POA: Diagnosis not present

## 2022-01-17 DIAGNOSIS — Z86718 Personal history of other venous thrombosis and embolism: Secondary | ICD-10-CM | POA: Diagnosis not present

## 2022-01-17 DIAGNOSIS — C679 Malignant neoplasm of bladder, unspecified: Secondary | ICD-10-CM | POA: Insufficient documentation

## 2022-01-17 DIAGNOSIS — Z85118 Personal history of other malignant neoplasm of bronchus and lung: Secondary | ICD-10-CM | POA: Diagnosis not present

## 2022-01-17 DIAGNOSIS — Z9981 Dependence on supplemental oxygen: Secondary | ICD-10-CM | POA: Diagnosis not present

## 2022-01-17 DIAGNOSIS — Z86711 Personal history of pulmonary embolism: Secondary | ICD-10-CM | POA: Diagnosis not present

## 2022-01-17 DIAGNOSIS — N390 Urinary tract infection, site not specified: Secondary | ICD-10-CM | POA: Diagnosis present

## 2022-01-17 DIAGNOSIS — Z8551 Personal history of malignant neoplasm of bladder: Secondary | ICD-10-CM | POA: Diagnosis not present

## 2022-01-17 DIAGNOSIS — R0902 Hypoxemia: Secondary | ICD-10-CM | POA: Diagnosis not present

## 2022-01-17 DIAGNOSIS — I10 Essential (primary) hypertension: Secondary | ICD-10-CM | POA: Diagnosis present

## 2022-01-17 DIAGNOSIS — Z90710 Acquired absence of both cervix and uterus: Secondary | ICD-10-CM | POA: Diagnosis not present

## 2022-01-17 DIAGNOSIS — U071 COVID-19: Secondary | ICD-10-CM | POA: Diagnosis present

## 2022-01-17 DIAGNOSIS — Z7901 Long term (current) use of anticoagulants: Secondary | ICD-10-CM | POA: Diagnosis not present

## 2022-01-17 DIAGNOSIS — W19XXXA Unspecified fall, initial encounter: Secondary | ICD-10-CM | POA: Diagnosis present

## 2022-01-17 DIAGNOSIS — J189 Pneumonia, unspecified organism: Secondary | ICD-10-CM | POA: Diagnosis present

## 2022-01-17 DIAGNOSIS — F0390 Unspecified dementia without behavioral disturbance: Secondary | ICD-10-CM | POA: Diagnosis present

## 2022-01-17 DIAGNOSIS — Z801 Family history of malignant neoplasm of trachea, bronchus and lung: Secondary | ICD-10-CM | POA: Diagnosis not present

## 2022-01-17 DIAGNOSIS — J129 Viral pneumonia, unspecified: Secondary | ICD-10-CM | POA: Diagnosis not present

## 2022-01-17 DIAGNOSIS — Z800 Family history of malignant neoplasm of digestive organs: Secondary | ICD-10-CM | POA: Diagnosis not present

## 2022-01-17 DIAGNOSIS — M4854XA Collapsed vertebra, not elsewhere classified, thoracic region, initial encounter for fracture: Secondary | ICD-10-CM | POA: Diagnosis present

## 2022-01-17 DIAGNOSIS — Z9181 History of falling: Secondary | ICD-10-CM | POA: Diagnosis not present

## 2022-01-17 DIAGNOSIS — Z902 Acquired absence of lung [part of]: Secondary | ICD-10-CM | POA: Diagnosis not present

## 2022-01-17 DIAGNOSIS — J9601 Acute respiratory failure with hypoxia: Secondary | ICD-10-CM | POA: Diagnosis present

## 2022-01-17 DIAGNOSIS — Z841 Family history of disorders of kidney and ureter: Secondary | ICD-10-CM | POA: Diagnosis not present

## 2022-01-17 DIAGNOSIS — I959 Hypotension, unspecified: Secondary | ICD-10-CM | POA: Diagnosis present

## 2022-01-17 DIAGNOSIS — S42001A Fracture of unspecified part of right clavicle, initial encounter for closed fracture: Secondary | ICD-10-CM | POA: Diagnosis present

## 2022-01-17 LAB — COMPREHENSIVE METABOLIC PANEL
ALT: 19 U/L (ref 0–44)
AST: 29 U/L (ref 15–41)
Albumin: 2.6 g/dL — ABNORMAL LOW (ref 3.5–5.0)
Alkaline Phosphatase: 88 U/L (ref 38–126)
Anion gap: 8 (ref 5–15)
BUN: 26 mg/dL — ABNORMAL HIGH (ref 8–23)
CO2: 28 mmol/L (ref 22–32)
Calcium: 9.1 mg/dL (ref 8.9–10.3)
Chloride: 105 mmol/L (ref 98–111)
Creatinine, Ser: 1.06 mg/dL — ABNORMAL HIGH (ref 0.44–1.00)
GFR, Estimated: 50 mL/min — ABNORMAL LOW (ref >60)
Glucose, Bld: 92 mg/dL (ref 70–99)
Potassium: 3.5 mmol/L (ref 3.5–5.1)
Sodium: 141 mmol/L (ref 135–145)
Total Bilirubin: 1.3 mg/dL — ABNORMAL HIGH (ref 0.3–1.2)
Total Protein: 6.1 g/dL — ABNORMAL LOW (ref 6.5–8.1)

## 2022-01-17 LAB — URINALYSIS, ROUTINE W REFLEX MICROSCOPIC
Bilirubin Urine: NEGATIVE
Glucose, UA: NEGATIVE mg/dL
Hgb urine dipstick: NEGATIVE
Ketones, ur: NEGATIVE mg/dL
Nitrite: NEGATIVE
Protein, ur: 30 mg/dL — AB
Specific Gravity, Urine: 1.011 (ref 1.005–1.030)
pH: 8 (ref 5.0–8.0)

## 2022-01-17 LAB — CBC WITH DIFFERENTIAL/PLATELET
Abs Immature Granulocytes: 0.11 10*3/uL — ABNORMAL HIGH (ref 0.00–0.07)
Basophils Absolute: 0 10*3/uL (ref 0.0–0.1)
Basophils Relative: 1 %
Eosinophils Absolute: 0.2 10*3/uL (ref 0.0–0.5)
Eosinophils Relative: 2 %
HCT: 36.5 % (ref 36.0–46.0)
Hemoglobin: 11.9 g/dL — ABNORMAL LOW (ref 12.0–15.0)
Immature Granulocytes: 1 %
Lymphocytes Relative: 14 %
Lymphs Abs: 1.1 10*3/uL (ref 0.7–4.0)
MCH: 31.2 pg (ref 26.0–34.0)
MCHC: 32.6 g/dL (ref 30.0–36.0)
MCV: 95.8 fL (ref 80.0–100.0)
Monocytes Absolute: 0.9 10*3/uL (ref 0.1–1.0)
Monocytes Relative: 12 %
Neutro Abs: 5.6 10*3/uL (ref 1.7–7.7)
Neutrophils Relative %: 70 %
Platelets: 210 10*3/uL (ref 150–400)
RBC: 3.81 MIL/uL — ABNORMAL LOW (ref 3.87–5.11)
RDW: 14.1 % (ref 11.5–15.5)
WBC: 7.9 10*3/uL (ref 4.0–10.5)
nRBC: 0 % (ref 0.0–0.2)

## 2022-01-17 LAB — LACTIC ACID, PLASMA
Lactic Acid, Venous: 1.2 mmol/L (ref 0.5–1.9)
Lactic Acid, Venous: 1.2 mmol/L (ref 0.5–1.9)

## 2022-01-17 LAB — PROCALCITONIN: Procalcitonin: <0.1 ng/mL

## 2022-01-17 LAB — BRAIN NATRIURETIC PEPTIDE: B Natriuretic Peptide: 148.3 pg/mL — ABNORMAL HIGH (ref 0.0–100.0)

## 2022-01-17 LAB — TROPONIN I (HIGH SENSITIVITY)
Troponin I (High Sensitivity): 10 ng/L (ref <18)
Troponin I (High Sensitivity): 10 ng/L (ref <18)

## 2022-01-17 LAB — RESP PANEL BY RT-PCR (FLU A&B, COVID) ARPGX2
Influenza A by PCR: NEGATIVE
Influenza B by PCR: NEGATIVE
SARS Coronavirus 2 by RT PCR: POSITIVE — AB

## 2022-01-17 MED ORDER — LACTATED RINGERS IV BOLUS: 1000.0000 mL | Freq: Once | INTRAVENOUS | Status: DC

## 2022-01-17 MED ORDER — NIRMATRELVIR/RITONAVIR (PAXLOVID) TABLET (RENAL DOSING)
2.0000 | ORAL_TABLET | Freq: Two times a day (BID) | ORAL | Status: DC
  Administered 2022-01-17 (×2): 2 via ORAL
  Filled 2022-01-17: qty 20

## 2022-01-17 MED ORDER — LACTATED RINGERS IV BOLUS
500.0000 mL | Freq: Once | INTRAVENOUS | Status: AC
  Administered 2022-01-17: 500 mL via INTRAVENOUS

## 2022-01-17 MED ORDER — OXYCODONE HCL 5 MG PO TABS
5.0000 mg | ORAL_TABLET | ORAL | Status: DC | PRN
  Administered 2022-01-24: 5 mg via ORAL
  Filled 2022-01-17: qty 1

## 2022-01-17 MED ORDER — IOHEXOL 350 MG/ML SOLN
75.0000 mL | Freq: Once | INTRAVENOUS | Status: AC | PRN
  Administered 2022-01-17: 75 mL via INTRAVENOUS

## 2022-01-17 MED ORDER — HYDROCOD POLI-CHLORPHE POLI ER 10-8 MG/5ML PO SUER
5.0000 mL | Freq: Two times a day (BID) | ORAL | Status: DC | PRN
  Administered 2022-01-24 (×2): 5 mL via ORAL
  Filled 2022-01-17 (×2): qty 5

## 2022-01-17 MED ORDER — TRAZODONE HCL 50 MG PO TABS
50.0000 mg | ORAL_TABLET | Freq: Every day | ORAL | Status: DC
  Administered 2022-01-17 – 2022-01-28 (×12): 50 mg via ORAL
  Filled 2022-01-17 (×13): qty 1

## 2022-01-17 MED ORDER — SODIUM CHLORIDE 0.9 % IV SOLN
1.0000 g | Freq: Once | INTRAVENOUS | Status: AC
  Administered 2022-01-17: 1 g via INTRAVENOUS
  Filled 2022-01-17: qty 10

## 2022-01-17 MED ORDER — GABAPENTIN 100 MG PO CAPS
100.0000 mg | ORAL_CAPSULE | Freq: Every day | ORAL | Status: DC
  Administered 2022-01-17 – 2022-01-28 (×12): 100 mg via ORAL
  Filled 2022-01-17 (×12): qty 1

## 2022-01-17 MED ORDER — DEXAMETHASONE 6 MG PO TABS
6.0000 mg | ORAL_TABLET | ORAL | Status: AC
  Administered 2022-01-17 – 2022-01-21 (×5): 6 mg via ORAL
  Filled 2022-01-17 (×5): qty 1

## 2022-01-17 MED ORDER — LACTATED RINGERS IV SOLN: INTRAVENOUS | Status: DC

## 2022-01-17 MED ORDER — ACETAMINOPHEN 325 MG PO TABS: 650.0000 mg | ORAL_TABLET | Freq: Four times a day (QID) | ORAL | Status: DC | PRN

## 2022-01-17 MED ORDER — SODIUM CHLORIDE 0.9 % IV SOLN: 1.0000 g | INTRAVENOUS | Status: DC

## 2022-01-17 MED ORDER — LACTATED RINGERS IV SOLN: INTRAVENOUS | Status: AC

## 2022-01-17 MED ORDER — ONDANSETRON HCL 4 MG/2ML IJ SOLN: 4.0000 mg | Freq: Four times a day (QID) | INTRAMUSCULAR | Status: DC | PRN

## 2022-01-17 MED ORDER — ALBUTEROL SULFATE HFA 108 (90 BASE) MCG/ACT IN AERS
2.0000 | INHALATION_SPRAY | Freq: Four times a day (QID) | RESPIRATORY_TRACT | Status: DC
  Administered 2022-01-17 – 2022-01-29 (×43): 2 via RESPIRATORY_TRACT
  Filled 2022-01-17 (×2): qty 6.7

## 2022-01-17 MED ORDER — ATENOLOL 25 MG PO TABS: 25.0000 mg | ORAL_TABLET | Freq: Every day | ORAL | Status: DC

## 2022-01-17 MED ORDER — GUAIFENESIN-DM 100-10 MG/5ML PO SYRP
10.0000 mL | ORAL_SOLUTION | ORAL | Status: DC | PRN
  Administered 2022-01-20: 10 mL via ORAL
  Filled 2022-01-17 (×2): qty 10

## 2022-01-17 MED ORDER — APIXABAN 5 MG PO TABS
5.0000 mg | ORAL_TABLET | Freq: Two times a day (BID) | ORAL | Status: DC
  Administered 2022-01-17 – 2022-01-29 (×25): 5 mg via ORAL
  Filled 2022-01-17 (×25): qty 1

## 2022-01-17 MED ORDER — ONDANSETRON HCL 4 MG PO TABS: 4.0000 mg | ORAL_TABLET | Freq: Four times a day (QID) | ORAL | Status: DC | PRN

## 2022-01-17 MED ORDER — MOLNUPIRAVIR EUA 200MG CAPSULE
4.0000 | ORAL_CAPSULE | Freq: Two times a day (BID) | ORAL | Status: AC
  Administered 2022-01-17 – 2022-01-22 (×10): 800 mg via ORAL
  Filled 2022-01-17: qty 4

## 2022-01-17 MED ORDER — APIXABAN 5 MG PO TABS: 5.0000 mg | ORAL_TABLET | Freq: Two times a day (BID) | ORAL | Status: DC

## 2022-01-17 MED ORDER — NIRMATRELVIR/RITONAVIR (PAXLOVID)TABLET: 3.0000 | ORAL_TABLET | Freq: Two times a day (BID) | ORAL | Status: DC

## 2022-01-17 MED ORDER — ALBUTEROL SULFATE (2.5 MG/3ML) 0.083% IN NEBU
2.5000 mg | INHALATION_SOLUTION | Freq: Four times a day (QID) | RESPIRATORY_TRACT | Status: DC
  Administered 2022-01-17 (×2): 2.5 mg via RESPIRATORY_TRACT
  Filled 2022-01-17 (×2): qty 3

## 2022-01-17 MED ORDER — LACTATED RINGERS IV BOLUS
1000.0000 mL | Freq: Once | INTRAVENOUS | Status: AC
  Administered 2022-01-17: 1000 mL via INTRAVENOUS

## 2022-01-17 NOTE — ED Provider Notes (Signed)
12:00 AM  Assumed care of patient at shift change.  Patient here with fall from her nursing facility and was hypoxic with EMS.  Does not wear oxygen chronically.  Chest x-ray reviewed and interpreted by myself and the radiologist and shows mild diffuse reticular interstitial opacities suggestive of chronic disease but no acute abnormality seen.  CT of the head and cervical spine shows no acute traumatic injury but she does have a subacute thoracic spine compression fractures.  Labs, urine pending.  COVID and flu swab pending.  We will add on a CTA of her chest because she does have a history of DVT as well as CTs of her thoracic and lumbar spine.  1:25 AM  Pt's labs unremarkable.  No leukocytosis.  Normal electrolytes.  Troponin negative.  BNP is 148.  Urine does appear possibly infected.  We will add on urine culture and give Rocephin.  She is COVID-positive which is likely the cause of her hypoxia.  We will start her on Paxlovid and admit to the hospitalist.   Consulted and discussed patient's case with hospitalist, Dr. Damita Dunnings.  I have recommended admission and consulting physician agrees and will place admission orders.  Patient (and family if present) agree with this plan.   I reviewed all nursing notes, vitals, pertinent previous records.  All labs, EKGs, imaging ordered have been independently reviewed and interpreted by myself.   3:00 AM  Pt's CT scans reviewed and interpreted by myself and the radiologist and show no PE but does show bilateral viral pneumonia and subacute to chronic bilateral rib fractures and compression fractures.  She does have an acute versus subacute proximal right clavicle fracture.  We will place her in a sling.  Patient is being admitted for COVID-pneumonia and hypoxia.   Nurse also let me know that patient had an episode of hypotension.  Concerned for possible sepsis due to COVID-pneumonia, UTI.  I have ordered 2L IVF, blood cultures, lactic acid, procalcitonin.  Nurse  will also update the hospitalist.   CRITICAL CARE Performed by: Pryor Curia   Total critical care time: 45 minutes  Critical care time was exclusive of separately billable procedures and treating other patients.  Critical care was necessary to treat or prevent imminent or life-threatening deterioration.  Critical care was time spent personally by me on the following activities: development of treatment plan with patient and/or surrogate as well as nursing, discussions with consultants, evaluation of patient's response to treatment, examination of patient, obtaining history from patient or surrogate, ordering and performing treatments and interventions, ordering and review of laboratory studies, ordering and review of radiographic studies, pulse oximetry and re-evaluation of patient's condition.    Brecklynn Jian, Delice Bison, DO 01/17/22 [phone]

## 2022-01-17 NOTE — ED Notes (Signed)
BP decreased... notified Dr Rojelio Brenner received.

## 2022-01-17 NOTE — ED Notes (Signed)
Assumed care of patient - full linen and gown change, replaced purewick. Vitals reassessed.   Pt alert and oriented x 2-3 at this time. Denies SOB or chest pain. Reports feeling better at this time.   Blanachable reddness noted to bil buttocks and upper thighs - small open wounds scattered on bil buttocks and upper thighs. Applied mepilex dressing.

## 2022-01-17 NOTE — Progress Notes (Signed)
Patient received from ED via bed.  Patient is alert and oriented x 2.  On 2L [place], with occasional congested coughs.  Denies pain at this time.  Bed pad changed, noted with multiple stage II pressure ulcers on sacrum and left lower buttock.  Cleansed and changed brown foam.

## 2022-01-17 NOTE — Assessment & Plan Note (Signed)
History of frequent falls Possibly related to acute infection CT head nonacute Fall precautions and PT eval

## 2022-01-17 NOTE — ED Notes (Signed)
Pt cleaned, placed in new gown. Pure wick replaced. Peri care preformed. Pt adjusted in bed. New linens placed.

## 2022-01-17 NOTE — ED Notes (Signed)
Patient resting in stretcher, Vitals updated. Provided patient denture cup, patient wants to keep dentures in place at this time.

## 2022-01-17 NOTE — ED Notes (Signed)
Slightly low BP - notified Dr. Ouida Sills.

## 2022-01-17 NOTE — Sepsis Progress Note (Signed)
Following per sepsis protocol   

## 2022-01-17 NOTE — Progress Notes (Signed)
CODE SEPSIS - PHARMACY COMMUNICATION  **Broad Spectrum Antibiotics should be administered within 1 hour of Sepsis diagnosis**  Time Code Sepsis Called/Page Received: 11/9 @ 0302   Antibiotics Ordered: Ceftriaxone 1 gm   Time of 1st antibiotic administration: Ceftriaxone 1 gm IV X 1 on 11/9 @ 0211  Additional action taken by pharmacy:   If necessary, Name of Provider/Nurse Contacted:     Myrikal Messmer D ,PharmD Clinical Pharmacist  01/17/2022  3:22 AM

## 2022-01-17 NOTE — H&P (Signed)
History and Physical    Patient: Jane Perez WIO:[id number] DOB: [birth year] DOA: 01/16/2022 DOS: the patient was seen and examined on 01/17/2022 PCP: Tracie Harrier, MD  Patient coming from: SNF  Chief Complaint:  Chief Complaint  Patient presents with   Fall    HPI: Jane Perez is a 88 y.o. 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  Patient was found at the facility lying flat on the ground with bruising noted to the right forehead.  She is unsure of the circumstances of the fall.  Reports a cough but otherwise was in her usual state of health.  She denies fever, chills, chest pain, dysuria.  Denies shortness of breath. ED course and data review: BP 101/59 with otherwise normal vitals.  Labs with troponin of 10, BNP 148.  Hemoglobin at baseline at 11.9.  Creatinine near baseline at 1.06.  COVID-positive, urinalysis with moderate leukocyte esterase and many bacteria.  EKG, personally viewed and interpreted showing sinus rhythm at 77 with nonspecific ST-T wave changes. Trauma imaging including CT head and C-spine T-spine L-spine showing remote compression deformities of T2-T3-T4 and L1 CTA chest negative for PE and showing patchy airspace opacities in the left lower lobe suspicious for pneumonia among other findings Patient was intermittently hypoxic to 89 in the ED.  She was placed on O2.  She was started on Paxlovid.  She was also started on ceftriaxone for possible UTI and hospitalist consulted for admission   Review of Systems: As mentioned in the history of present illness. All other systems reviewed and are negative.  Past Medical History:  Diagnosis Date   Asthma    Bladder cancer ([place])    Colitis    DVT (deep venous thrombosis) ([facility])     Endometriosis    Heart murmur    HTN (hypertension)    Kidney disease    Liver disease    Lung cancer ([place])    Pulmonary thrombosis ([facility])    Past Surgical History:  Procedure Laterality Date   ABDOMINAL HYSTERECTOMY     Bladder cancer surgery     Endometrial surgery     LUNG REMOVAL, PARTIAL Right    SMALL INTESTINE SURGERY     x 2   Social History:  reports that she has never smoked. She does not have any smokeless tobacco history on file. She reports that she does not drink alcohol and does not use drugs.  Allergies  Allergen Reactions   Aspirin Other (See Comments)    Will cause hemerage    Ciprofloxacin Hives   Codeine Other (See Comments)    Other Reaction: GI Upset   Metronidazole Hives   Penicillins Shortness Of Breath   Sulfa Antibiotics Shortness Of Breath   Clindamycin Hcl Other (See Comments)   Nitrofurantoin Monohyd Macro Other (See Comments) and Nausea Only   Other Other (See Comments)    Uncoded Allergy. Allergen: flu shot, Other Reaction: Other reaction   Povidone Iodine Other (See Comments)    Other Reaction: Other reaction    Family History  Problem Relation Age of Onset   Kidney disease Brother    Bladder Cancer Neg Hx    Kidney disease Mother    Lung cancer Father    Colon cancer Mother    Hypertension Other  multiple family members    Prior to Admission medications   Medication Sig Start Date End Date Taking? Authorizing Provider  atenolol (TENORMIN) 25 MG tablet Take 25 mg by mouth daily.    [provider]  Cranberry 500 MG TABS Take 500 mg by mouth daily.    [provider]  ELIQUIS 5 MG TABS tablet Take 5 mg by mouth 2 (two) times daily. 11/23/21   [provider]  furosemide (LASIX) 20 MG tablet Take 1 tablet (20 mg total) by mouth daily. 09/04/21   Emeterio Reeve, DO  gabapentin (NEURONTIN) 100 MG capsule Take 100 mg by mouth at bedtime.    [provider]  Lactobacillus Acid-Pectin  (ACIDOPHILUS/PECTIN) CAPS Take 1 capsule by mouth daily.    [provider]  Multiple Vitamin (MULTI-VITAMINS) TABS Take 1 tablet by mouth daily.    [provider]  oxyCODONE (OXY IR/ROXICODONE) 5 MG immediate release tablet Take 1 tablet (5 mg total) by mouth every 4 (four) hours as needed for moderate pain or severe pain. 09/04/21   Emeterio Reeve, DO  traZODone (DESYREL) 50 MG tablet Take 50 mg by mouth at bedtime.    [provider]  vitamin B-12 (CYANOCOBALAMIN) 1000 MCG tablet Take 1,000 mcg by mouth daily.    [provider]  Vitamin D, Ergocalciferol, (DRISDOL) 1.25 MG (50000 UNIT) CAPS capsule Take 50,000 Units by mouth once a week.    [provider]  Vitamin E 180 MG (400 UNIT) CAPS Take 400 Int'l Units by mouth daily.    [provider]    Physical Exam: Vitals:   01/16/22 2228  BP: (!) 101/59  Pulse: 80  Resp: 16  Temp: 98.4 F (36.9 C)  TempSrc: Oral  SpO2: 90%   Physical Exam Vitals and nursing note reviewed.  Constitutional:      General: She is not in acute distress.    Appearance: She is ill-appearing.  HENT:     Head: Normocephalic and atraumatic.  Cardiovascular:     Rate and Rhythm: Normal rate and regular rhythm.     Heart sounds: Normal heart sounds.  Pulmonary:     Effort: Pulmonary effort is normal.     Breath sounds: Wheezing and rales present.  Abdominal:     Palpations: Abdomen is soft.     Tenderness: There is no abdominal tenderness.  Neurological:     Mental Status: Mental status is at baseline. She is lethargic.     Labs on Admission: I have personally reviewed following labs and imaging studies  CBC: Recent Labs  Lab 01/16/22 2358  WBC 7.9  NEUTROABS 5.6  HGB 11.9*  HCT 36.5  MCV 95.8  PLT 696   Basic Metabolic Panel: Recent Labs  Lab 01/16/22 2358  NA 141  K 3.5  CL 105  CO2 28  GLUCOSE 92  BUN 26*  CREATININE 1.06*  CALCIUM 9.1   GFR: CrCl cannot be  calculated (Unknown ideal weight.). Liver Function Tests: Recent Labs  Lab 01/16/22 2358  AST 29  ALT 19  ALKPHOS 88  BILITOT 1.3*  PROT 6.1*  ALBUMIN 2.6*   No results for input(s): "LIPASE", "AMYLASE" in the last 168 hours. No results for input(s): "AMMONIA" in the last 168 hours. Coagulation Profile: No results for input(s): "INR", "PROTIME" in the last 168 hours. Cardiac Enzymes: No results for input(s): "CKTOTAL", "CKMB", "CKMBINDEX", "TROPONINI" in the last 168 hours. BNP (last 3 results) No results for input(s): "PROBNP" in the  last 8760 hours. HbA1C: No results for input(s): "HGBA1C" in the last 72 hours. CBG: No results for input(s): "GLUCAP" in the last 168 hours. Lipid Profile: No results for input(s): "CHOL", "HDL", "LDLCALC", "TRIG", "CHOLHDL", "LDLDIRECT" in the last 72 hours. Thyroid Function Tests: No results for input(s): "TSH", "T4TOTAL", "FREET4", "T3FREE", "THYROIDAB" in the last 72 hours. Anemia Panel: No results for input(s): "VITAMINB12", "FOLATE", "FERRITIN", "TIBC", "IRON", "RETICCTPCT" in the last 72 hours. Urine analysis:    Component Value Date/Time   COLORURINE AMBER (A) 01/16/2022 2355   APPEARANCEUR CLOUDY (A) 01/16/2022 2355   APPEARANCEUR Clear 04/11/2015 1339   LABSPEC 1.011 01/16/2022 2355   PHURINE 8.0 01/16/2022 2355   GLUCOSEU NEGATIVE 01/16/2022 2355   HGBUR NEGATIVE 01/16/2022 2355   BILIRUBINUR NEGATIVE 01/16/2022 2355   BILIRUBINUR Negative 04/11/2015 [place] 01/16/2022 2355   PROTEINUR 30 (A) 01/16/2022 2355   NITRITE NEGATIVE 01/16/2022 2355   LEUKOCYTESUR MODERATE (A) 01/16/2022 2355    Radiological Exams on Admission: CT T-SPINE NO CHARGE  Result Date: 01/17/2022 CLINICAL DATA:  Frequent falls, found down EXAM: CT THORACIC AND LUMBAR SPINE WITHOUT CONTRAST TECHNIQUE: Multidetector CT imaging of the thoracic and lumbar spine was performed without contrast. Multiplanar CT image reconstructions were also  generated. RADIATION DOSE REDUCTION: This exam was performed according to the departmental dose-optimization program which includes automated exposure control, adjustment of the mA and/or kV according to patient size and/or use of iterative reconstruction technique. COMPARISON:  09/02/2021 CT T and L-spine; correlation is also made with CT cervical spine 12/27/2021 FINDINGS: CT THORACIC SPINE FINDINGS Alignment: No traumatic listhesis. Exaggeration of the normal thoracic kyphosis. Scoliosis. Vertebrae: Osteopenia. Unchanged remote compression deformities T3 and T4. 10% vertebral body height loss and increased density of the superior aspect of T2, which appears unchanged compared to the 12/27/2021 CT cervical spine. Unchanged remote T2 spinous process fracture (series 9, image 56) no definite acute fracture. Paraspinal and other soft tissues: Please see same-day CT chest Disc levels: Multilevel degenerative disc disease without significant spinal canal stenosis. CT LUMBAR SPINE FINDINGS Segmentation: 5 lumbar-type vertebral bodies. Alignment: Exaggeration of the normal lumbar lordosis. Lumbar levoscoliosis. 3 mm anterolisthesis of T12 on L1. 3 mm retrolisthesis of L1 on L2. 3 mm anterolisthesis of L3 on L4, L4 on L5, and L5 on S1. Vertebrae: No acute fracture or suspicious osseous lesion. Redemonstrated remote L1 compression fracture. Vertebral body heights are otherwise preserved. Paraspinal and other soft tissues: Aortic atherosclerosis. Disc levels: T12-L1: Disc height loss and grade 1 anterolisthesis. No significant spinal canal stenosis or neural foraminal narrowing. L1-L2: Disc height loss and moderate disc bulge with annular calcification. Mild facet arthropathy. Moderate spinal canal stenosis and severe right and moderate left neural foraminal narrowing. L2-L3: Mild disc bulge. Mild facet arthropathy. Ligamentum flavum hypertrophy. Moderate spinal canal stenosis. No neural foraminal narrowing. L3-L4: Trace  anterolisthesis disc unroofing and moderate disc bulge. Severe facet arthropathy. Ligamentum flavum hypertrophy. Severe spinal canal stenosis. Severe left neural foraminal narrowing. L4-L5: Anterolisthesis and disc unroofing with moderate right eccentric disc bulge. Severe facet arthropathy. Ligamentum flavum hypertrophy. Mild spinal canal stenosis. No neural foraminal narrowing. L5-S1: Anterolisthesis with disc unroofing. Severe facet arthropathy. Severe spinal canal stenosis. Moderate left neural foraminal narrowing. IMPRESSION: 1. No acute fracture or traumatic listhesis in the thoracic or lumbar spine. 2. Unchanged remote compression deformities of T2, T3, T4, and L1. 3. Multilevel degenerative disc disease and spondylolisthesis, as described above. 4. Aortic atherosclerosis. Aortic Atherosclerosis (ICD10-I70.0). Electronically Signed  By: Merilyn Baba M.D.   On: 01/17/2022 01:56   CT Lumbar Spine Wo Contrast  Result Date: 01/17/2022 CLINICAL DATA:  Frequent falls, found down EXAM: CT THORACIC AND LUMBAR SPINE WITHOUT CONTRAST TECHNIQUE: Multidetector CT imaging of the thoracic and lumbar spine was performed without contrast. Multiplanar CT image reconstructions were also generated. RADIATION DOSE REDUCTION: This exam was performed according to the departmental dose-optimization program which includes automated exposure control, adjustment of the mA and/or kV according to patient size and/or use of iterative reconstruction technique. COMPARISON:  09/02/2021 CT T and L-spine; correlation is also made with CT cervical spine 12/27/2021 FINDINGS: CT THORACIC SPINE FINDINGS Alignment: No traumatic listhesis. Exaggeration of the normal thoracic kyphosis. Scoliosis. Vertebrae: Osteopenia. Unchanged remote compression deformities T3 and T4. 10% vertebral body height loss and increased density of the superior aspect of T2, which appears unchanged compared to the 12/27/2021 CT cervical spine. Unchanged remote T2  spinous process fracture (series 9, image 56) no definite acute fracture. Paraspinal and other soft tissues: Please see same-day CT chest Disc levels: Multilevel degenerative disc disease without significant spinal canal stenosis. CT LUMBAR SPINE FINDINGS Segmentation: 5 lumbar-type vertebral bodies. Alignment: Exaggeration of the normal lumbar lordosis. Lumbar levoscoliosis. 3 mm anterolisthesis of T12 on L1. 3 mm retrolisthesis of L1 on L2. 3 mm anterolisthesis of L3 on L4, L4 on L5, and L5 on S1. Vertebrae: No acute fracture or suspicious osseous lesion. Redemonstrated remote L1 compression fracture. Vertebral body heights are otherwise preserved. Paraspinal and other soft tissues: Aortic atherosclerosis. Disc levels: T12-L1: Disc height loss and grade 1 anterolisthesis. No significant spinal canal stenosis or neural foraminal narrowing. L1-L2: Disc height loss and moderate disc bulge with annular calcification. Mild facet arthropathy. Moderate spinal canal stenosis and severe right and moderate left neural foraminal narrowing. L2-L3: Mild disc bulge. Mild facet arthropathy. Ligamentum flavum hypertrophy. Moderate spinal canal stenosis. No neural foraminal narrowing. L3-L4: Trace anterolisthesis disc unroofing and moderate disc bulge. Severe facet arthropathy. Ligamentum flavum hypertrophy. Severe spinal canal stenosis. Severe left neural foraminal narrowing. L4-L5: Anterolisthesis and disc unroofing with moderate right eccentric disc bulge. Severe facet arthropathy. Ligamentum flavum hypertrophy. Mild spinal canal stenosis. No neural foraminal narrowing. L5-S1: Anterolisthesis with disc unroofing. Severe facet arthropathy. Severe spinal canal stenosis. Moderate left neural foraminal narrowing. IMPRESSION: 1. No acute fracture or traumatic listhesis in the thoracic or lumbar spine. 2. Unchanged remote compression deformities of T2, T3, T4, and L1. 3. Multilevel degenerative disc disease and spondylolisthesis, as  described above. 4. Aortic atherosclerosis. Aortic Atherosclerosis (ICD10-I70.0). Electronically Signed   By: Merilyn Baba M.D.   On: 01/17/2022 01:56   CT Angio Chest PE W and/or Wo Contrast  Result Date: 01/17/2022 CLINICAL DATA:  88 year old female with history of DVT and PE on anticoagulation; found down at her facility EXAM: CT ANGIOGRAPHY CHEST WITH CONTRAST TECHNIQUE: Multidetector CT imaging of the chest was performed using the standard protocol during bolus administration of intravenous contrast. Multiplanar CT image reconstructions and MIPs were obtained to evaluate the vascular anatomy. RADIATION DOSE REDUCTION: This exam was performed according to the departmental dose-optimization program which includes automated exposure control, adjustment of the mA and/or kV according to patient size and/or use of iterative reconstruction technique. CONTRAST:  85mL OMNIPAQUE IOHEXOL 350 MG/ML SOLN COMPARISON:  Radiographs 01/16/2022 and CT chest 11/11/2012. FINDINGS: Cardiovascular: Satisfactory opacification of the pulmonary arteries to the segmental level. No evidence of pulmonary embolism. Cardiomegaly. No pericardial effusion. Coronary artery and aortic atherosclerotic calcification. Mediastinum/Nodes: No enlarged  mediastinal, hilar, or axillary lymph nodes. Thyroid gland, trachea, and esophagus demonstrate no significant findings. Lungs/Pleura: Respiratory motion obscures fine detail. Mild diffuse interlobular septal thickening. Bronchial wall thickening and mucous plugging in the left-greater-than-right lower lobes. Atelectasis or pneumonia in the left lower lobe and lingula. Upper Abdomen: No acute abnormality. Bilateral adrenal thickening which is slightly nodular on the left slightly increased from 2014 but likely representing benign hyperplasia/adenoma. No follow-up is recommended. Cholecystectomy. Musculoskeletal: Age indeterminate fracture of the proximal right clavicle which is minimally displaced  as a acute to subacute appearance. Numerous subacute and chronic bilateral rib fractures. Marked compression fracture of T4. See separate report from CT of the thoracic spine for details. Review of the MIP images confirms the above findings. IMPRESSION: 1. Negative for acute pulmonary embolism. 2. Patchy airspace opacities bilaterally greatest in the left lower lobe suspicious for pneumonia. 3. Cardiomegaly and interstitial thickening suggestive of pulmonary edema. 4. Acute or subacute proximal right clavicle fracture. 5. Subacute and chronic bilateral rib fractures. 6. Age indeterminate compression fracture of T4. See separate report for findings in the thoracolumbar spine. Electronically Signed   By: Placido Sou M.D.   On: 01/17/2022 01:52   CT HEAD WO CONTRAST (5MM)  Result Date: 01/16/2022 CLINICAL DATA:  Head and neck trauma after fall on anticoagulation; bruising to the right forehead EXAM: CT HEAD WITHOUT CONTRAST TECHNIQUE: Contiguous axial images were obtained from the base of the skull through the vertex without intravenous contrast. RADIATION DOSE REDUCTION: This exam was performed according to the departmental dose-optimization program which includes automated exposure control, adjustment of the mA and/or kV according to patient size and/or use of iterative reconstruction technique. COMPARISON:  CT head and C-spine 12/27/2021 FINDINGS: CT HEAD FINDINGS Brain: No intracranial hemorrhage, mass effect, or evidence of acute infarct. No hydrocephalus. No extra-axial fluid collection. Generalized cerebral atrophy. Ill-defined hypoattenuation within the cerebral white matter is nonspecific but consistent with chronic small vessel ischemic disease. Vascular: No hyperdense vessel. Intracranial arterial calcification. Skull: No fracture or focal lesion. Scalp hematoma over the right forehead. Sinuses/Orbits: Mucosal thickening with air-fluid levels in both maxillary sinuses. Mucosal thickening in the  ethmoid air cells. Other: None. CT CERVICAL SPINE FINDINGS Alignment: Multiple low-grade vertebral body subluxations are similar to prior. Skull base and vertebrae: No acute fracture. Redemonstrated subacute superior endplate compression fractures and sclerosis of T2 and partially visualized T3. Minimally displaced fracture through the T2 spinous process with signs of sclerosis about the fracture line is also unchanged. Soft tissues and spinal canal: No prevertebral fluid or swelling. No visible canal hematoma. Disc levels: Unchanged multilevel advanced spondylosis and facet arthropathy. Posterior disc osteophyte complexes cause multilevel moderate spinal canal narrowing. Uncovertebral spurring and facet arthropathy cause advanced neural foraminal narrowing bilaterally at C4-C5. Upper chest: Negative. Other: None. IMPRESSION: 1. No acute intracranial abnormality. Generalized atrophy and small vessel white matter disease. 2. No acute fracture in the cervical spine. Multilevel degenerative spondylosis. 3. Subacute compression fractures of T2 and T3 and mildly displaced subacute fracture of the T2 spinous process. 4. Right forehead scalp hematoma.  No calvarial fracture. 5. Mucosal thickening in the ethmoid and bilateral maxillary sinuses with air-fluid levels can be seen with acute sinusitis. Electronically Signed   By: Placido Sou M.D.   On: 01/16/2022 23:47   CT Cervical Spine Wo Contrast  Result Date: 01/16/2022 CLINICAL DATA:  Head and neck trauma after fall on anticoagulation; bruising to the right forehead EXAM: CT HEAD WITHOUT CONTRAST TECHNIQUE: Contiguous axial images  were obtained from the base of the skull through the vertex without intravenous contrast. RADIATION DOSE REDUCTION: This exam was performed according to the departmental dose-optimization program which includes automated exposure control, adjustment of the mA and/or kV according to patient size and/or use of iterative reconstruction  technique. COMPARISON:  CT head and C-spine 12/27/2021 FINDINGS: CT HEAD FINDINGS Brain: No intracranial hemorrhage, mass effect, or evidence of acute infarct. No hydrocephalus. No extra-axial fluid collection. Generalized cerebral atrophy. Ill-defined hypoattenuation within the cerebral white matter is nonspecific but consistent with chronic small vessel ischemic disease. Vascular: No hyperdense vessel. Intracranial arterial calcification. Skull: No fracture or focal lesion. Scalp hematoma over the right forehead. Sinuses/Orbits: Mucosal thickening with air-fluid levels in both maxillary sinuses. Mucosal thickening in the ethmoid air cells. Other: None. CT CERVICAL SPINE FINDINGS Alignment: Multiple low-grade vertebral body subluxations are similar to prior. Skull base and vertebrae: No acute fracture. Redemonstrated subacute superior endplate compression fractures and sclerosis of T2 and partially visualized T3. Minimally displaced fracture through the T2 spinous process with signs of sclerosis about the fracture line is also unchanged. Soft tissues and spinal canal: No prevertebral fluid or swelling. No visible canal hematoma. Disc levels: Unchanged multilevel advanced spondylosis and facet arthropathy. Posterior disc osteophyte complexes cause multilevel moderate spinal canal narrowing. Uncovertebral spurring and facet arthropathy cause advanced neural foraminal narrowing bilaterally at C4-C5. Upper chest: Negative. Other: None. IMPRESSION: 1. No acute intracranial abnormality. Generalized atrophy and small vessel white matter disease. 2. No acute fracture in the cervical spine. Multilevel degenerative spondylosis. 3. Subacute compression fractures of T2 and T3 and mildly displaced subacute fracture of the T2 spinous process. 4. Right forehead scalp hematoma.  No calvarial fracture. 5. Mucosal thickening in the ethmoid and bilateral maxillary sinuses with air-fluid levels can be seen with acute sinusitis.  Electronically Signed   By: Placido Sou M.D.   On: 01/16/2022 23:47   DG Chest 1 View  Result Date: 01/16/2022 CLINICAL DATA:  Chest pain EXAM: CHEST  1 VIEW COMPARISON:  12/09/2021, CT 11/11/2012 FINDINGS: Mild cardiomegaly. Mild diffuse reticular interstitial opacity. Probable subsegmental atelectasis at the bases. No pneumothorax. Aortic atherosclerosis. Old right clavicle fracture. IMPRESSION: 1. Mild diffuse reticular interstitial opacity suggestive of underlying chronic disease. There is probable subsegmental atelectasis at the bases 2. Cardiomegaly Electronically Signed   By: Donavan Foil M.D.   On: 01/16/2022 23:41     Data Reviewed: Relevant notes from primary care and specialist visits, past discharge summaries as available in EHR, including Care Everywhere. Prior diagnostic testing as pertinent to current admission diagnoses Updated medications and problem lists for reconciliation ED course, including vitals, labs, imaging, treatment and response to treatment Triage notes, nursing and pharmacy notes and ED provider's notes Notable results as noted in HPI   Assessment and Plan: * Fall History of frequent falls Possibly related to acute infection CT head nonacute Fall precautions and PT eval  Pneumonia due to COVID-19 virus Hypoxic to 89% Supplemental O2 Paxlovid, antitussives, albuterol as needed Oral dexamethasone initiated Incentive spirometer and supportive care  History of DVT (deep vein thrombosis) Continue Eliquis  History of bladder cancer History of lung cancer Followed by [facility] hospice/palliative care  Urinary tract infection History of frequent UTIs Continue Rocephin and follow cultures Had Klebsiella UTI 12/09/2021        DVT prophylaxis: Lovenox  Consults: none  Advance Care Planning:   Code Status: Prior   Family Communication: none  Disposition Plan: Back to previous home environment  Severity of Illness: The appropriate  patient status for this patient is INPATIENT. Inpatient status is judged to be reasonable and necessary in order to provide the required intensity of service to ensure the patient's safety. The patient's presenting symptoms, physical exam findings, and initial radiographic and laboratory data in the context of their chronic comorbidities is felt to place them at high risk for further clinical deterioration. Furthermore, it is not anticipated that the patient will be medically stable for discharge from the hospital within 2 midnights of admission.   * I certify that at the point of admission it is my clinical judgment that the patient will require inpatient hospital care spanning beyond 2 midnights from the point of admission due to high intensity of service, high risk for further deterioration and high frequency of surveillance required.*  Author: Athena Masse, MD 01/17/2022 2:07 AM  For on call review [email or web address].

## 2022-01-17 NOTE — ED Notes (Signed)
Inadvertently Dc'd second bolus of LR.  Spoke with Dr Leonides Schanz who states that pt needs a total of 2586ml before maintenance fluids.  Increased rate on the liter that is hanging and will utilize next as a continuous at 134ml.

## 2022-01-17 NOTE — Evaluation (Signed)
Physical Therapy Evaluation Patient Details Name: Jane Perez MRN: [id number] DOB: 1931-03-03 Today's Date: 01/17/2022  History of Present Illness  Jane Perez is a 88 y.o. 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  Patient was found at the facility lying flat on the ground with bruising noted to the right forehead.  She is unsure of the circumstances of the fall.  Reports a cough but otherwise was in her usual state of health.  She denies fever, chills, chest pain, dysuria.  Denies shortness of breath.  ED course and data review: BP 101/59 with otherwise normal vitals.  Labs with troponin of 10, BNP 148.  Hemoglobin at baseline at 11.9.  Creatinine near baseline at 1.06.  COVID-positive, urinalysis with moderate leukocyte esterase and many bacteria.  EKG, personally viewed and interpreted showing sinus rhythm at 77 with nonspecific ST-T wave changes.  Trauma imaging including CT head and C-spine T-spine L-spine showing remote compression deformities of T2-T3-T4 and L1  CTA chest negative for PE and showing patchy airspace opacities in the left lower lobe suspicious for pneumonia among other findings  Patient was intermittently hypoxic to 89 in the ED.  She was placed on O2.  She was started on Paxlovid.  She was also started on ceftriaxone for possible UTI and hospitalist consulted for admission  Clinical Impression  Pt is a pleasant 88 year old female who was admitted for fall. Discussed via secure chat, no WBing precautions at this time as ortho doesn't think it is an acute fx. Pt performs bed mobility with mod assist and once at bedside, poor upright posture and pt began sliding off stretcher as pt unable to maintain upright posture. Assisted with returning back to bed.  Would improve with standing from lower surface. Pt demonstrates deficits with strength/mobility. As pt is confused, she is poor historian. Reports multiple falls. Pt reports she has been indep PTA. Would benefit from skilled PT to address above deficits and promote optimal return to PLOF; recommend transition to STR upon discharge from acute hospitalization.       Recommendations for follow up therapy are one component of a multi-disciplinary discharge planning process, led by the attending physician.  Recommendations may be updated based on patient status, additional functional criteria and insurance authorization.  Follow Up Recommendations Skilled nursing-short term rehab (<3 hours/day) Can patient physically be transported by private vehicle: No    Assistance Recommended at Discharge Frequent or constant Supervision/Assistance  Patient can return home with the following  Two people to help with walking and/or transfers;Two people to help with bathing/dressing/bathroom    Equipment Recommendations  (TBD)  Recommendations for Other Services       Functional Status Assessment Patient has had a recent decline in their functional status and demonstrates the ability to make significant improvements in function in a reasonable and predictable amount of time.     Precautions / Restrictions Precautions Precautions: Fall Restrictions Weight Bearing Restrictions: No      Mobility  Bed Mobility Overal bed mobility: Needs Assistance Bed Mobility: Supine to Sit     Supine to sit: Mod assist     General bed mobility comments: needs assist for sliding B LE off bed. During sitting at EOB, pt begins sliding off bed secondary to no foot support. Offered to get  stool in room to promote OOB mobility, however pt very anxious and preferred to lay down. Assisted with mod assist back into bed.    Transfers                   General transfer comment: unable at this time     Ambulation/Gait                  Stairs            Wheelchair Mobility    Modified Rankin (Stroke Patients Only)       Balance Overall balance assessment: History of Falls                                           Pertinent Vitals/Pain Pain Assessment Pain Assessment: No/denies pain    Home Living Family/patient expects to be discharged to:: Assisted living                 Home Equipment: Rolling Walker (2 wheels) Additional Comments: from the [place] per chart review. Pt is poor historian and reports she is from home alone with 2 STE and railing.    Prior Function Prior Level of Function : Independent/Modified Independent             Mobility Comments: she reports she has recently been indep with all mobility, endorces multiple falls.       Hand Dominance        Extremity/Trunk Assessment   Upper Extremity Assessment Upper Extremity Assessment: Generalized weakness (B UE grossly 3+/5)    Lower Extremity Assessment Lower Extremity Assessment: Generalized weakness (B LE grossly 3/5)       Communication   Communication: No difficulties  Cognition Arousal/Alertness: Awake/alert Behavior During Therapy: WFL for tasks assessed/performed Overall Cognitive Status: No family/caregiver present to determine baseline cognitive functioning                                 General Comments: appears confused, however pleasant. Confused to date and place and reports she is from home        General Comments General comments (skin integrity, edema, etc.): B LE wraps    Exercises Other Exercises Other Exercises: able to perform B LE heel slides, quad sets, and hip abd/add. 10 reps   Assessment/Plan    PT Assessment Patient needs continued PT services  PT Problem List Decreased strength;Decreased balance;Decreased mobility;Decreased cognition       PT Treatment Interventions DME instruction;Gait  training;Therapeutic activities;Therapeutic exercise    PT Goals (Current goals can be found in the Care Plan section)  Acute Rehab PT Goals Patient Stated Goal: to get out of the hospita PT Goal Formulation: With patient Time For Goal Achievement: 01/31/22 Potential to Achieve Goals: Good    Frequency Min 2X/week     Co-evaluation               AM-PAC PT "6 Clicks" Mobility  Outcome Measure Help needed turning from your back to your side while in a flat bed without using bedrails?: A Lot Help needed moving from lying on your back to sitting on the side of a flat bed without using bedrails?: A Lot Help needed moving to and from a bed to a chair (including a wheelchair)?: Total Help needed standing up  from a chair using your arms (e.g., wheelchair or bedside chair)?: Total Help needed to walk in hospital room?: Total Help needed climbing 3-5 steps with a railing? : Total 6 Click Score: 8    End of Session Equipment Utilized During Treatment: Oxygen Activity Tolerance: Patient tolerated treatment well Patient left: in bed Nurse Communication: Mobility status PT Visit Diagnosis: Muscle weakness (generalized) (M62.81);Difficulty in walking, not elsewhere classified (R26.2);Repeated falls (R29.6)    Time: 5374-8270 PT Time Calculation (min) (ACUTE ONLY): 16 min   Charges:   PT Evaluation $PT Eval Low Complexity: 1 Low          Greggory Stallion, PT, DPT, GCS [phone]   Valinda Fedie 01/17/2022, 4:28 PM

## 2022-01-17 NOTE — ED Notes (Signed)
Pt repositioned in bed - pure wick remains in place.

## 2022-01-17 NOTE — ED Notes (Signed)
Informed RN bed assigned 

## 2022-01-17 NOTE — Progress Notes (Addendum)
  INTERVAL PROGRESS NOTE    Jane Perez- 88 y.o. female  LOS: 0 __________________________________________________________________  SUBJECTIVE: Admitted 01/16/2022 with cc of  Chief Complaint  Patient presents with   Fall   Patient does NOT meet sepsis criteria.  Since admission, pt has received >2.5L LR, decadron, paxlovid, CTX.  Has become more alert, oxygen dependence decreased from 3L>2L. Blood pressure remains borderline low but patient is asymptomatic.   OBJECTIVE: Blood pressure (!) 103/59, pulse (!) 57, temperature 97.7 F (36.5 C), temperature source Oral, resp. rate 14, height 5\' 3"  (1.6 m), weight 72.6 kg, SpO2 98 %.  ASSESSMENT/PLAN:  I have reviewed the full H&P by Dr. Damita Dunnings, and I agree with the assessment and plan as outlined therein. In addition:  Patient does NOT meet sepsis criteria.  Fall resulting in no significant injuries. Imaging reveals several old rib fractures and proximal clavicle fracture R with indeterminate age. Also age indeterminate compression fracture of T4 History of frequent falls Multifactorial in setting of acute illness, hypotension, bradycardia. CT head nonacute. Will need continuous monitoring and further workup.  - continuous cardiac telemetry - maintenance IV fluids - Fall precautions and PT eval - ortho curbside on clavicle fracture   Pneumonia due to COVID-19 virus- Hypoxic to 89% on presentation, now stable on 2L. CTA negative for acute PE. CTA showed patchy airspace opcaities bilaterally greatest in left lower lobe suspicious for pneumonia. Procalcitonin normal. Less likely to have secondary bacterial pna - wean to room air as tolerated.  - continue Paxlovid, antitussives, albuterol as needed  - paxlovid changed due to interaction with eliquis - Oral dexamethasone initiated - Incentive spirometer and supportive care - follow blood cultures   History of DVT (deep vein thrombosis) Continue Eliquis   History of bladder  cancer History of lung cancer Followed by [facility] hospice/palliative care   Urinary tract infection described on admission- she was asymptomatic and urinalysis not consistent with infection. Had Klebsiella UTI 12/09/2021. Has other more pertinent sources of her current state. Received initial dose of rocephin in ED. Will discontinue at this time but will continue to follow urine cultures. Low threshold to restart.  - follow urine output - follow UCx   Principal Problem:   Fall Active Problems:   Pneumonia due to COVID-19 virus   Hypoxia   Urinary tract infection   History of bladder cancer   History of DVT (deep vein thrombosis)   Richarda Osmond, DO [facility] 01/17/2022, 8:02 AM    [email or web address] Available by Epic secure chat 7AM-7PM. If 7PM-7AM, please contact night-coverage   No Charge

## 2022-01-17 NOTE — Assessment & Plan Note (Addendum)
Hypoxic to 89% Supplemental O2 Paxlovid, antitussives, albuterol as needed Oral dexamethasone initiated Incentive spirometer and supportive care

## 2022-01-17 NOTE — Assessment & Plan Note (Signed)
History of lung cancer Followed by [facility] hospice/palliative care

## 2022-01-17 NOTE — Assessment & Plan Note (Signed)
-   Continue Eliquis 

## 2022-01-17 NOTE — Assessment & Plan Note (Addendum)
History of frequent UTIs Continue Rocephin and follow cultures Had Klebsiella UTI 12/09/2021

## 2022-01-18 DIAGNOSIS — J9601 Acute respiratory failure with hypoxia: Secondary | ICD-10-CM

## 2022-01-18 DIAGNOSIS — W19XXXA Unspecified fall, initial encounter: Secondary | ICD-10-CM | POA: Diagnosis not present

## 2022-01-18 DIAGNOSIS — S42001A Fracture of unspecified part of right clavicle, initial encounter for closed fracture: Secondary | ICD-10-CM

## 2022-01-18 DIAGNOSIS — U071 COVID-19: Secondary | ICD-10-CM | POA: Diagnosis not present

## 2022-01-18 LAB — COMPREHENSIVE METABOLIC PANEL
ALT: 18 U/L (ref 0–44)
AST: 25 U/L (ref 15–41)
Albumin: 2.2 g/dL — ABNORMAL LOW (ref 3.5–5.0)
Alkaline Phosphatase: 86 U/L (ref 38–126)
Anion gap: 2 — ABNORMAL LOW (ref 5–15)
BUN: 16 mg/dL (ref 8–23)
CO2: 30 mmol/L (ref 22–32)
Calcium: 8.7 mg/dL — ABNORMAL LOW (ref 8.9–10.3)
Chloride: 110 mmol/L (ref 98–111)
Creatinine, Ser: 0.87 mg/dL (ref 0.44–1.00)
GFR, Estimated: >60 mL/min (ref >60)
Glucose, Bld: 83 mg/dL (ref 70–99)
Potassium: 3.7 mmol/L (ref 3.5–5.1)
Sodium: 142 mmol/L (ref 135–145)
Total Bilirubin: 0.9 mg/dL (ref 0.3–1.2)
Total Protein: 5.6 g/dL — ABNORMAL LOW (ref 6.5–8.1)

## 2022-01-18 LAB — CBC
HCT: 38.3 % (ref 36.0–46.0)
Hemoglobin: 12.2 g/dL (ref 12.0–15.0)
MCH: 30.9 pg (ref 26.0–34.0)
MCHC: 31.9 g/dL (ref 30.0–36.0)
MCV: 97 fL (ref 80.0–100.0)
Platelets: 192 10*3/uL (ref 150–400)
RBC: 3.95 MIL/uL (ref 3.87–5.11)
RDW: 14.2 % (ref 11.5–15.5)
WBC: 4.9 10*3/uL (ref 4.0–10.5)
nRBC: 0 % (ref 0.0–0.2)

## 2022-01-18 NOTE — Progress Notes (Signed)
PROGRESS NOTE  Jane Perez    DOB: [birth year], 88 y.o.  ELF:[id number]    Code Status: Full Code   DOA: 01/16/2022   LOS: 1   Brief hospital course  Jane Perez is a 88 y.o. 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  They presented from SNF to the ED on 01/16/2022 with fall. Has frequent falls as demonstrated by multiple healed fractures on admission imaging.  In the ED, it was found that they had  BP 101/59 with otherwise normal vitals.Labs with troponin of 10, BNP 148.  Hemoglobin at baseline at 11.9.  Creatinine near baseline at 1.06.  COVID-positive, urinalysis with moderate leukocyte esterase and many bacteria.   Significant findings included CT head and C-spine T-spine L-spine showing remote compression deformities of T2-T3-T4 and L1 CTA chest negative for PE and showing patchy airspace opacities in the left lower lobe suspicious for pneumonia among other findings including remote rib and clavicle fractures.  They were initially treated with paxlovid, steroids and supplemental oxygen.   Patient was admitted to medicine service for further workup and management of covid 19 as outlined in detail below.  01/18/22 -stable, improving  Assessment & Plan  Principal Problem:   Fall Active Problems:   Pneumonia due to COVID-19 virus   Hypoxia   Urinary tract infection   History of bladder cancer   History of DVT (deep vein thrombosis)   Pneumonia  Patient does NOT meet sepsis criteria.   Fall resulting in no significant injuries. Imaging reveals several old rib fractures and proximal clavicle fracture R with indeterminate age. Also age indeterminate compression fracture of T4 History of frequent falls Multifactorial in setting of acute illness, hypotension, bradycardia. CT head nonacute. Will need  continuous monitoring and further workup.  - continuous cardiac telemetry - maintenance IV fluids - Fall precautions and PT eval - ortho curbside on clavicle fracture and does not recommend intervention. Likely subacute given patient not complaining of pain  Hypotension-  - IV maintenance fluids - hold home BP meds   Pneumonia due to COVID-19 virus- Hypoxic to 89% on presentation, now stable on 2L. CTA negative for acute PE. CTA showed patchy airspace opcaities bilaterally greatest in left lower lobe suspicious for pneumonia. Procalcitonin normal. Less likely to have secondary bacterial pna - wean to room air as tolerated.  - continue Paxlovid, antitussives, albuterol as needed             - paxlovid changed due to interaction with eliquis - Oral dexamethasone initiated - Incentive spirometer and supportive care - follow blood cultures   History of DVT (deep vein thrombosis) Continue Eliquis   History of bladder cancer History of lung cancer Followed by [facility] hospice/palliative care   Urinary tract infection described on admission- she was asymptomatic and urinalysis not consistent with infection. Had Klebsiella UTI 12/09/2021. Has other more pertinent sources of her current state. Received initial dose of rocephin in ED. Will discontinue at this time but will continue to follow urine cultures. Low threshold to restart.  - follow urine output - follow Ucx  Dementia- very pleasant. Mildly confused. Oriented to self and place but not date or circumstances this am  Body mass index is 28.35 kg/m.  VTE ppx:  apixaban (ELIQUIS) tablet 5 mg   Diet:     Diet   DIET DYS  3 Room service appropriate? Yes; Fluid consistency: Thin   Consultants: none Subjective 01/18/22    Pt reports no complaints. Denies pain, dyspnea.    Objective   Vitals:   01/17/22 1612 01/17/22 1622 01/17/22 1658 01/17/22 2328  BP: (!) 100/55  (!) 89/55 (!) 109/50  Pulse: 71  75 72  Resp: 20 20 16  18   Temp: 97.8 F (36.6 C)  98.1 F (36.7 C) 98.2 F (36.8 C)  TempSrc: Oral     SpO2: 97% 96% 97% 95%  Weight:      Height:        Intake/Output Summary (Last 24 hours) at 01/18/2022 0740 Last data filed at 01/18/2022 0303 Gross per 24 hour  Intake 2429.12 ml  Output 950 ml  Net 1479.12 ml   Filed Weights   01/17/22 0418  Weight: 72.6 kg     Physical Exam:  General: awake, alert, NAD HEENT: atraumatic, clear conjunctiva, anicteric sclera, MMM, hearing grossly normal Respiratory: normal respiratory effort. CTAB Cardiovascular: quick capillary refill, normal S1/S2, RRR, no JVD, murmurs Nervous: A&O x2. no gross focal neurologic deficits, normal speech Extremities: moves all equally, no edema, normal tone Skin: dry, intact, normal temperature, normal color. No rashes, lesions or ulcers on exposed skin Psychiatry: normal mood, congruent affect  Labs   I have personally reviewed the following labs and imaging studies CBC    Component Value Date/Time   WBC 7.9 01/16/2022 2358   RBC 3.81 (L) 01/16/2022 2358   HGB 11.9 (L) 01/16/2022 2358   HCT 36.5 01/16/2022 2358   PLT 210 01/16/2022 2358   MCV 95.8 01/16/2022 2358   MCH 31.2 01/16/2022 2358   MCHC 32.6 01/16/2022 2358   RDW 14.1 01/16/2022 2358   LYMPHSABS 1.1 01/16/2022 2358   MONOABS 0.9 01/16/2022 2358   EOSABS 0.2 01/16/2022 2358   BASOSABS 0.0 01/16/2022 2358      Latest Ref Rng & Units 01/16/2022   11:58 PM 12/09/2021   11:40 AM 09/04/2021    6:06 AM  BMP  Glucose 70 - 99 mg/dL 92  132  89   BUN 8 - 23 mg/dL 26  22  23    Creatinine 0.44 - 1.00 mg/dL 1.06  1.02  0.96   Sodium 135 - 145 mmol/L 141  139  139   Potassium 3.5 - 5.1 mmol/L 3.5  4.0  3.4   Chloride 98 - 111 mmol/L 105  102  100   CO2 22 - 32 mmol/L 28  27  31    Calcium 8.9 - 10.3 mg/dL 9.1  9.5  9.3     CT T-SPINE NO CHARGE  Result Date: 01/17/2022 CLINICAL DATA:  Frequent falls, found down EXAM: CT THORACIC AND LUMBAR SPINE WITHOUT  CONTRAST TECHNIQUE: Multidetector CT imaging of the thoracic and lumbar spine was performed without contrast. Multiplanar CT image reconstructions were also generated. RADIATION DOSE REDUCTION: This exam was performed according to the departmental dose-optimization program which includes automated exposure control, adjustment of the mA and/or kV according to patient size and/or use of iterative reconstruction technique. COMPARISON:  09/02/2021 CT T and L-spine; correlation is also made with CT cervical spine 12/27/2021 FINDINGS: CT THORACIC SPINE FINDINGS Alignment: No traumatic listhesis. Exaggeration of the normal thoracic kyphosis. Scoliosis. Vertebrae: Osteopenia. Unchanged remote compression deformities T3 and T4. 10% vertebral body height loss and increased density of the superior aspect of T2, which appears unchanged compared to the 12/27/2021 CT cervical spine. Unchanged remote T2 spinous process fracture (series  9, image 56) no definite acute fracture. Paraspinal and other soft tissues: Please see same-day CT chest Disc levels: Multilevel degenerative disc disease without significant spinal canal stenosis. CT LUMBAR SPINE FINDINGS Segmentation: 5 lumbar-type vertebral bodies. Alignment: Exaggeration of the normal lumbar lordosis. Lumbar levoscoliosis. 3 mm anterolisthesis of T12 on L1. 3 mm retrolisthesis of L1 on L2. 3 mm anterolisthesis of L3 on L4, L4 on L5, and L5 on S1. Vertebrae: No acute fracture or suspicious osseous lesion. Redemonstrated remote L1 compression fracture. Vertebral body heights are otherwise preserved. Paraspinal and other soft tissues: Aortic atherosclerosis. Disc levels: T12-L1: Disc height loss and grade 1 anterolisthesis. No significant spinal canal stenosis or neural foraminal narrowing. L1-L2: Disc height loss and moderate disc bulge with annular calcification. Mild facet arthropathy. Moderate spinal canal stenosis and severe right and moderate left neural foraminal narrowing.  L2-L3: Mild disc bulge. Mild facet arthropathy. Ligamentum flavum hypertrophy. Moderate spinal canal stenosis. No neural foraminal narrowing. L3-L4: Trace anterolisthesis disc unroofing and moderate disc bulge. Severe facet arthropathy. Ligamentum flavum hypertrophy. Severe spinal canal stenosis. Severe left neural foraminal narrowing. L4-L5: Anterolisthesis and disc unroofing with moderate right eccentric disc bulge. Severe facet arthropathy. Ligamentum flavum hypertrophy. Mild spinal canal stenosis. No neural foraminal narrowing. L5-S1: Anterolisthesis with disc unroofing. Severe facet arthropathy. Severe spinal canal stenosis. Moderate left neural foraminal narrowing. IMPRESSION: 1. No acute fracture or traumatic listhesis in the thoracic or lumbar spine. 2. Unchanged remote compression deformities of T2, T3, T4, and L1. 3. Multilevel degenerative disc disease and spondylolisthesis, as described above. 4. Aortic atherosclerosis. Aortic Atherosclerosis (ICD10-I70.0). Electronically Signed   By: Merilyn Baba M.D.   On: 01/17/2022 01:56   CT Lumbar Spine Wo Contrast  Result Date: 01/17/2022 CLINICAL DATA:  Frequent falls, found down EXAM: CT THORACIC AND LUMBAR SPINE WITHOUT CONTRAST TECHNIQUE: Multidetector CT imaging of the thoracic and lumbar spine was performed without contrast. Multiplanar CT image reconstructions were also generated. RADIATION DOSE REDUCTION: This exam was performed according to the departmental dose-optimization program which includes automated exposure control, adjustment of the mA and/or kV according to patient size and/or use of iterative reconstruction technique. COMPARISON:  09/02/2021 CT T and L-spine; correlation is also made with CT cervical spine 12/27/2021 FINDINGS: CT THORACIC SPINE FINDINGS Alignment: No traumatic listhesis. Exaggeration of the normal thoracic kyphosis. Scoliosis. Vertebrae: Osteopenia. Unchanged remote compression deformities T3 and T4. 10% vertebral body  height loss and increased density of the superior aspect of T2, which appears unchanged compared to the 12/27/2021 CT cervical spine. Unchanged remote T2 spinous process fracture (series 9, image 56) no definite acute fracture. Paraspinal and other soft tissues: Please see same-day CT chest Disc levels: Multilevel degenerative disc disease without significant spinal canal stenosis. CT LUMBAR SPINE FINDINGS Segmentation: 5 lumbar-type vertebral bodies. Alignment: Exaggeration of the normal lumbar lordosis. Lumbar levoscoliosis. 3 mm anterolisthesis of T12 on L1. 3 mm retrolisthesis of L1 on L2. 3 mm anterolisthesis of L3 on L4, L4 on L5, and L5 on S1. Vertebrae: No acute fracture or suspicious osseous lesion. Redemonstrated remote L1 compression fracture. Vertebral body heights are otherwise preserved. Paraspinal and other soft tissues: Aortic atherosclerosis. Disc levels: T12-L1: Disc height loss and grade 1 anterolisthesis. No significant spinal canal stenosis or neural foraminal narrowing. L1-L2: Disc height loss and moderate disc bulge with annular calcification. Mild facet arthropathy. Moderate spinal canal stenosis and severe right and moderate left neural foraminal narrowing. L2-L3: Mild disc bulge. Mild facet arthropathy. Ligamentum flavum hypertrophy. Moderate spinal canal stenosis.  No neural foraminal narrowing. L3-L4: Trace anterolisthesis disc unroofing and moderate disc bulge. Severe facet arthropathy. Ligamentum flavum hypertrophy. Severe spinal canal stenosis. Severe left neural foraminal narrowing. L4-L5: Anterolisthesis and disc unroofing with moderate right eccentric disc bulge. Severe facet arthropathy. Ligamentum flavum hypertrophy. Mild spinal canal stenosis. No neural foraminal narrowing. L5-S1: Anterolisthesis with disc unroofing. Severe facet arthropathy. Severe spinal canal stenosis. Moderate left neural foraminal narrowing. IMPRESSION: 1. No acute fracture or traumatic listhesis in the  thoracic or lumbar spine. 2. Unchanged remote compression deformities of T2, T3, T4, and L1. 3. Multilevel degenerative disc disease and spondylolisthesis, as described above. 4. Aortic atherosclerosis. Aortic Atherosclerosis (ICD10-I70.0). Electronically Signed   By: Merilyn Baba M.D.   On: 01/17/2022 01:56   CT Angio Chest PE W and/or Wo Contrast  Result Date: 01/17/2022 CLINICAL DATA:  88 year old female with history of DVT and PE on anticoagulation; found down at her facility EXAM: CT ANGIOGRAPHY CHEST WITH CONTRAST TECHNIQUE: Multidetector CT imaging of the chest was performed using the standard protocol during bolus administration of intravenous contrast. Multiplanar CT image reconstructions and MIPs were obtained to evaluate the vascular anatomy. RADIATION DOSE REDUCTION: This exam was performed according to the departmental dose-optimization program which includes automated exposure control, adjustment of the mA and/or kV according to patient size and/or use of iterative reconstruction technique. CONTRAST:  53mL OMNIPAQUE IOHEXOL 350 MG/ML SOLN COMPARISON:  Radiographs 01/16/2022 and CT chest 11/11/2012. FINDINGS: Cardiovascular: Satisfactory opacification of the pulmonary arteries to the segmental level. No evidence of pulmonary embolism. Cardiomegaly. No pericardial effusion. Coronary artery and aortic atherosclerotic calcification. Mediastinum/Nodes: No enlarged mediastinal, hilar, or axillary lymph nodes. Thyroid gland, trachea, and esophagus demonstrate no significant findings. Lungs/Pleura: Respiratory motion obscures fine detail. Mild diffuse interlobular septal thickening. Bronchial wall thickening and mucous plugging in the left-greater-than-right lower lobes. Atelectasis or pneumonia in the left lower lobe and lingula. Upper Abdomen: No acute abnormality. Bilateral adrenal thickening which is slightly nodular on the left slightly increased from 2014 but likely representing benign  hyperplasia/adenoma. No follow-up is recommended. Cholecystectomy. Musculoskeletal: Age indeterminate fracture of the proximal right clavicle which is minimally displaced as a acute to subacute appearance. Numerous subacute and chronic bilateral rib fractures. Marked compression fracture of T4. See separate report from CT of the thoracic spine for details. Review of the MIP images confirms the above findings. IMPRESSION: 1. Negative for acute pulmonary embolism. 2. Patchy airspace opacities bilaterally greatest in the left lower lobe suspicious for pneumonia. 3. Cardiomegaly and interstitial thickening suggestive of pulmonary edema. 4. Acute or subacute proximal right clavicle fracture. 5. Subacute and chronic bilateral rib fractures. 6. Age indeterminate compression fracture of T4. See separate report for findings in the thoracolumbar spine. Electronically Signed   By: Placido Sou M.D.   On: 01/17/2022 01:52   CT HEAD WO CONTRAST (5MM)  Result Date: 01/16/2022 CLINICAL DATA:  Head and neck trauma after fall on anticoagulation; bruising to the right forehead EXAM: CT HEAD WITHOUT CONTRAST TECHNIQUE: Contiguous axial images were obtained from the base of the skull through the vertex without intravenous contrast. RADIATION DOSE REDUCTION: This exam was performed according to the departmental dose-optimization program which includes automated exposure control, adjustment of the mA and/or kV according to patient size and/or use of iterative reconstruction technique. COMPARISON:  CT head and C-spine 12/27/2021 FINDINGS: CT HEAD FINDINGS Brain: No intracranial hemorrhage, mass effect, or evidence of acute infarct. No hydrocephalus. No extra-axial fluid collection. Generalized cerebral atrophy. Ill-defined hypoattenuation within the cerebral white  matter is nonspecific but consistent with chronic small vessel ischemic disease. Vascular: No hyperdense vessel. Intracranial arterial calcification. Skull: No fracture  or focal lesion. Scalp hematoma over the right forehead. Sinuses/Orbits: Mucosal thickening with air-fluid levels in both maxillary sinuses. Mucosal thickening in the ethmoid air cells. Other: None. CT CERVICAL SPINE FINDINGS Alignment: Multiple low-grade vertebral body subluxations are similar to prior. Skull base and vertebrae: No acute fracture. Redemonstrated subacute superior endplate compression fractures and sclerosis of T2 and partially visualized T3. Minimally displaced fracture through the T2 spinous process with signs of sclerosis about the fracture line is also unchanged. Soft tissues and spinal canal: No prevertebral fluid or swelling. No visible canal hematoma. Disc levels: Unchanged multilevel advanced spondylosis and facet arthropathy. Posterior disc osteophyte complexes cause multilevel moderate spinal canal narrowing. Uncovertebral spurring and facet arthropathy cause advanced neural foraminal narrowing bilaterally at C4-C5. Upper chest: Negative. Other: None. IMPRESSION: 1. No acute intracranial abnormality. Generalized atrophy and small vessel white matter disease. 2. No acute fracture in the cervical spine. Multilevel degenerative spondylosis. 3. Subacute compression fractures of T2 and T3 and mildly displaced subacute fracture of the T2 spinous process. 4. Right forehead scalp hematoma.  No calvarial fracture. 5. Mucosal thickening in the ethmoid and bilateral maxillary sinuses with air-fluid levels can be seen with acute sinusitis. Electronically Signed   By: Placido Sou M.D.   On: 01/16/2022 23:47   CT Cervical Spine Wo Contrast  Result Date: 01/16/2022 CLINICAL DATA:  Head and neck trauma after fall on anticoagulation; bruising to the right forehead EXAM: CT HEAD WITHOUT CONTRAST TECHNIQUE: Contiguous axial images were obtained from the base of the skull through the vertex without intravenous contrast. RADIATION DOSE REDUCTION: This exam was performed according to the departmental  dose-optimization program which includes automated exposure control, adjustment of the mA and/or kV according to patient size and/or use of iterative reconstruction technique. COMPARISON:  CT head and C-spine 12/27/2021 FINDINGS: CT HEAD FINDINGS Brain: No intracranial hemorrhage, mass effect, or evidence of acute infarct. No hydrocephalus. No extra-axial fluid collection. Generalized cerebral atrophy. Ill-defined hypoattenuation within the cerebral white matter is nonspecific but consistent with chronic small vessel ischemic disease. Vascular: No hyperdense vessel. Intracranial arterial calcification. Skull: No fracture or focal lesion. Scalp hematoma over the right forehead. Sinuses/Orbits: Mucosal thickening with air-fluid levels in both maxillary sinuses. Mucosal thickening in the ethmoid air cells. Other: None. CT CERVICAL SPINE FINDINGS Alignment: Multiple low-grade vertebral body subluxations are similar to prior. Skull base and vertebrae: No acute fracture. Redemonstrated subacute superior endplate compression fractures and sclerosis of T2 and partially visualized T3. Minimally displaced fracture through the T2 spinous process with signs of sclerosis about the fracture line is also unchanged. Soft tissues and spinal canal: No prevertebral fluid or swelling. No visible canal hematoma. Disc levels: Unchanged multilevel advanced spondylosis and facet arthropathy. Posterior disc osteophyte complexes cause multilevel moderate spinal canal narrowing. Uncovertebral spurring and facet arthropathy cause advanced neural foraminal narrowing bilaterally at C4-C5. Upper chest: Negative. Other: None. IMPRESSION: 1. No acute intracranial abnormality. Generalized atrophy and small vessel white matter disease. 2. No acute fracture in the cervical spine. Multilevel degenerative spondylosis. 3. Subacute compression fractures of T2 and T3 and mildly displaced subacute fracture of the T2 spinous process. 4. Right forehead scalp  hematoma.  No calvarial fracture. 5. Mucosal thickening in the ethmoid and bilateral maxillary sinuses with air-fluid levels can be seen with acute sinusitis. Electronically Signed   By: Placido Sou M.D.   On:  01/16/2022 23:47   DG Chest 1 View  Result Date: 01/16/2022 CLINICAL DATA:  Chest pain EXAM: CHEST  1 VIEW COMPARISON:  12/09/2021, CT 11/11/2012 FINDINGS: Mild cardiomegaly. Mild diffuse reticular interstitial opacity. Probable subsegmental atelectasis at the bases. No pneumothorax. Aortic atherosclerosis. Old right clavicle fracture. IMPRESSION: 1. Mild diffuse reticular interstitial opacity suggestive of underlying chronic disease. There is probable subsegmental atelectasis at the bases 2. Cardiomegaly Electronically Signed   By: Donavan Foil M.D.   On: 01/16/2022 23:41    Disposition Plan & Communication  Patient status: Inpatient  Admitted From: SNF Planned disposition location: Skilled nursing facility Anticipated discharge date: 11/17 pending covid restrictions for discharging facility  Family Communication: none    Author: Richarda Osmond, DO [facility] 01/18/2022, 7:40 AM   Available by Epic secure chat 7AM-7PM. If 7PM-7AM, please contact night-coverage.  [facility] contact information found on [email or web address].

## 2022-01-18 NOTE — Plan of Care (Signed)

## 2022-01-18 NOTE — Plan of Care (Signed)
  Problem: Clinical Measurements: Goal: Diagnostic test results will improve Outcome: Progressing Goal: Respiratory complications will improve Outcome: Progressing Goal: Cardiovascular complication will be avoided Outcome: Progressing   Problem: Nutrition: Goal: Adequate nutrition will be maintained Outcome: Progressing   Problem: Pain Managment: Goal: General experience of comfort will improve Outcome: Progressing   

## 2022-01-18 NOTE — Progress Notes (Signed)
Physical Therapy Treatment Patient Details Name: Jane Perez MRN: [id number] DOB: December 27, 1930 Today's Date: 01/18/2022   History of Present Illness Pt is an 88 y.o. 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  Patient was found at the facility lying flat on the ground with bruising noted to the right forehead.   MD assessment includes: COVID-positive, falls, hyponatremia, and PNA secondary to Covid. Trauma imaging including CT head and C-spine T-spine L-spine showing remote compression deformities of T2-T3-T4 and L1  CTA chest negative for PE.    PT Comments    Pt was pleasant and motivated to participate during the session and put forth good effort throughout. Pt required extensive physical assistance with sup to/from sit and once in sitting presented with pronounced posterior lean with knees extended with pt voicing fear of falling but no pain.  Extensive time spent working on gradually working towards increased anterior weight shift and knee flexion with pt eventually progressing to where she could sit with close SBA and make multiple attempts to stand. Pt unable to clear the surface of the bed even with max A with poor anterior weight shift and less than ideal foot positioning likely contributing to difficulty coming to standing.  Pt will benefit from PT services in a SNF setting upon discharge to safely address deficits listed in patient problem list for decreased caregiver assistance and eventual return to PLOF.     Recommendations for follow up therapy are one component of a multi-disciplinary discharge planning process, led by the attending physician.  Recommendations may be updated based on patient status, additional functional criteria and insurance  authorization.  Follow Up Recommendations  Skilled nursing-short term rehab (<3 hours/day) Can patient physically be transported by private vehicle: No   Assistance Recommended at Discharge Frequent or constant Supervision/Assistance  Patient can return home with the following Two people to help with walking and/or transfers;Two people to help with bathing/dressing/bathroom;Assist for transportation;Help with stairs or ramp for entrance   Equipment Recommendations  Other (comment) (TBD at next venue of care)    Recommendations for Other Services       Precautions / Restrictions Precautions Precautions: Fall Restrictions Weight Bearing Restrictions: No     Mobility  Bed Mobility Overal bed mobility: Needs Assistance Bed Mobility: Supine to Sit, Sit to Supine     Supine to sit: Mod assist Sit to supine: Mod assist   General bed mobility comments: Mod A for BLE and trunk control with sup to/from sit    Transfers   Equipment used: Rolling walker (2 wheels)               General transfer comment: Multiple attemps made to stand from EOB with pt unable to clear the surface of the bed even with max A; pt with significant difficulty maintaining anterior weight shift as well as proper foot positiong without constant physical assist on top of max multi-modal cuing    Ambulation/Gait                   Stairs             Wheelchair Mobility    Modified Rankin (Stroke Patients Only)       Balance Overall balance assessment: History of Falls, Needs assistance Sitting-balance support: Bilateral upper extremity supported, Feet  supported Sitting balance-Leahy Scale: Poor Sitting balance - Comments: Frequent posterior lean Postural control: Posterior lean     Standing balance comment: Unable                            Cognition Arousal/Alertness: Awake/alert Behavior During Therapy: WFL for tasks assessed/performed Overall Cognitive  Status: No family/caregiver present to determine baseline cognitive functioning                                 General Comments: Very pleasant but somewhat confused with extra time and cuing needed to follow commands        Exercises Total Joint Exercises Ankle Circles/Pumps: AROM, Strengthening, Both, 10 reps Quad Sets: Strengthening, Both, 10 reps Heel Slides: AAROM, Strengthening, Both, 5 reps, 10 reps Hip ABduction/ADduction: AAROM, Strengthening, Both, 10 reps Straight Leg Raises: AAROM, Strengthening, Both, 10 reps Long Arc Quad: AAROM, Strengthening, Both, 10 reps, 5 reps Knee Flexion: AAROM, Strengthening, Both, 5 reps, 10 reps Other Exercises Other Exercises: Extensive anterior weight shifting activities in sitting at EOB to fascilitate improved sit to stand performance    General Comments        Pertinent Vitals/Pain Pain Assessment Pain Assessment: No/denies pain    Home Living                          Prior Function            PT Goals (current goals can now be found in the care plan section) Progress towards PT goals: Progressing toward goals    Frequency    Min 2X/week      PT Plan Current plan remains appropriate    Co-evaluation              AM-PAC PT "6 Clicks" Mobility   Outcome Measure  Help needed turning from your back to your side while in a flat bed without using bedrails?: A Lot Help needed moving from lying on your back to sitting on the side of a flat bed without using bedrails?: A Lot Help needed moving to and from a bed to a chair (including a wheelchair)?: Total Help needed standing up from a chair using your arms (e.g., wheelchair or bedside chair)?: Total Help needed to walk in hospital room?: Total Help needed climbing 3-5 steps with a railing? : Total 6 Click Score: 8    End of Session Equipment Utilized During Treatment: Oxygen Activity Tolerance: Patient tolerated treatment well Patient  left: in bed;with call bell/phone within reach;with bed alarm set Nurse Communication: Mobility status PT Visit Diagnosis: Muscle weakness (generalized) (M62.81);Difficulty in walking, not elsewhere classified (R26.2);Repeated falls (R29.6)     Time: 1030-1314 PT Time Calculation (min) (ACUTE ONLY): 38 min  Charges:  $Therapeutic Exercise: 8-22 mins $Therapeutic Activity: 23-37 mins                     D. Scott Leitha Hyppolite PT, DPT 01/18/22, 2:55 PM

## 2022-01-19 DIAGNOSIS — J9601 Acute respiratory failure with hypoxia: Secondary | ICD-10-CM | POA: Diagnosis not present

## 2022-01-19 DIAGNOSIS — W19XXXA Unspecified fall, initial encounter: Secondary | ICD-10-CM | POA: Diagnosis not present

## 2022-01-19 DIAGNOSIS — U071 COVID-19: Secondary | ICD-10-CM | POA: Diagnosis not present

## 2022-01-19 LAB — BASIC METABOLIC PANEL
Anion gap: 3 — ABNORMAL LOW (ref 5–15)
BUN: 17 mg/dL (ref 8–23)
CO2: 29 mmol/L (ref 22–32)
Calcium: 9 mg/dL (ref 8.9–10.3)
Chloride: 108 mmol/L (ref 98–111)
Creatinine, Ser: 0.81 mg/dL (ref 0.44–1.00)
GFR, Estimated: >60 mL/min (ref >60)
Glucose, Bld: 132 mg/dL — ABNORMAL HIGH (ref 70–99)
Potassium: 4.4 mmol/L (ref 3.5–5.1)
Sodium: 140 mmol/L (ref 135–145)

## 2022-01-19 LAB — CBC
HCT: 35.8 % — ABNORMAL LOW (ref 36.0–46.0)
Hemoglobin: 11.5 g/dL — ABNORMAL LOW (ref 12.0–15.0)
MCH: 30.9 pg (ref 26.0–34.0)
MCHC: 32.1 g/dL (ref 30.0–36.0)
MCV: 96.2 fL (ref 80.0–100.0)
Platelets: 198 10*3/uL (ref 150–400)
RBC: 3.72 MIL/uL — ABNORMAL LOW (ref 3.87–5.11)
RDW: 13.9 % (ref 11.5–15.5)
WBC: 8.2 10*3/uL (ref 4.0–10.5)
nRBC: 0 % (ref 0.0–0.2)

## 2022-01-19 LAB — URINE CULTURE: Culture: >=100000 — AB

## 2022-01-19 NOTE — Plan of Care (Signed)
  Problem: Respiratory: Goal: Will maintain a patent airway Outcome: Progressing   Problem: Health Behavior/Discharge Planning: Goal: Ability to manage health-related needs will improve Outcome: Progressing   Problem: Clinical Measurements: Goal: Diagnostic test results will improve Outcome: Progressing Goal: Respiratory complications will improve Outcome: Progressing Goal: Cardiovascular complication will be avoided Outcome: Progressing   Problem: Activity: Goal: Risk for activity intolerance will decrease Outcome: Progressing   Problem: Nutrition: Goal: Adequate nutrition will be maintained Outcome: Progressing   Problem: Elimination: Goal: Will not experience complications related to bowel motility Outcome: Progressing Goal: Will not experience complications related to urinary retention Outcome: Progressing   Problem: Pain Managment: Goal: General experience of comfort will improve Outcome: Progressing   Problem: Safety: Goal: Ability to remain free from injury will improve Outcome: Progressing

## 2022-01-19 NOTE — TOC Progression Note (Signed)
Transition of Care [facility]) - Progression Note    Patient Details  Name: Jane Perez MRN: [id number] Date of Birth: [birth year]  Transition of [facility] CM/SW Contact  Izola Price, RN Phone Number: 01/19/2022, 11:30 AM  Clinical Narrative: 11/11: Spoke with son regarding discharge planning, TOC role, PT recommendations, how to access Medicare.gov and STR/SNF discussion. Patient is not fully oriented this am per Unit RN assessment notes. Patient is from [facility].  Son was on the way to work and will look at website, discuss with spouse and with patient and RN CM will reach out again Sunday to discuss. Son is hopeful patient can return to ALF with [facility] though he expressed concerns about how much assistance is given at ALF but had been discussing patient changing needs prior to his admission. Was discharged from [facility] to [facility] in June of 2023. Simmie Davies RN CM          Expected Discharge Plan and Services                                                 Social Determinants of Health (SDOH) Interventions    Readmission Risk Interventions     No data to display

## 2022-01-19 NOTE — Progress Notes (Signed)
PROGRESS NOTE  [facility]    DOB: 07-07-1930, 88 y.o.  VFI:[id number]    Code Status: Full Code   DOA: 01/16/2022   LOS: 2   Brief hospital course  Jane Perez is a 88 y.o. 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  They presented from SNF to the ED on 01/16/2022 with fall. Has frequent falls as demonstrated by multiple healed fractures on admission imaging.  In the ED, it was found that they had  BP 101/59 with otherwise normal vitals.Labs with troponin of 10, BNP 148.  Hemoglobin at baseline at 11.9.  Creatinine near baseline at 1.06.  COVID-positive, urinalysis with moderate leukocyte esterase and many bacteria.   Significant findings included CT head and C-spine T-spine L-spine showing remote compression deformities of T2-T3-T4 and L1 CTA chest negative for PE and showing patchy airspace opacities in the left lower lobe suspicious for pneumonia among other findings including remote rib and clavicle fractures.  They were initially treated with paxlovid, steroids and supplemental oxygen.   Patient was admitted to medicine service for further workup and management of covid 19 as outlined in detail below.  01/19/22 -stable, improving and weaned to room air. Barrier to discharge is SNF acceptance policy s/p COVID  Assessment & Plan  Principal Problem:   Fall Active Problems:   Pneumonia due to COVID-19 virus   Hypoxia   Urinary tract infection   History of bladder cancer   History of DVT (deep vein thrombosis)   Pneumonia   Acute respiratory failure with hypoxia (HCC)   Closed nondisplaced fracture of right clavicle   COVID-19  Patient does NOT meet sepsis criteria.   Fall resulting in no significant injuries. Imaging reveals several old rib fractures and proximal clavicle fracture R with  indeterminate age. Also age indeterminate compression fracture of T4 History of frequent falls Multifactorial in setting of acute illness, hypotension, bradycardia. CT head nonacute. Will need continuous monitoring and further workup.  - continuous cardiac telemetry - maintenance IV fluids - Fall precautions and PT eval - ortho curbside on clavicle fracture and does not recommend intervention. Likely subacute given patient not complaining of pain  Hypotension- stable - IV maintenance fluids - hold home BP meds   Pneumonia due to COVID-19 virus- Hypoxic to 89% on presentation, now stable on room air. CTA negative for acute PE. CTA showed patchy airspace opcaities bilaterally greatest in left lower lobe suspicious for pneumonia. Procalcitonin normal. Less likely to have secondary bacterial pna - continue Paxlovid, antitussives, albuterol as needed             - paxlovid changed due to interaction with eliquis - Oral dexamethasone initiated - Incentive spirometer and supportive care - follow blood cultures   History of DVT (deep vein thrombosis) Continue Eliquis   History of bladder cancer History of lung cancer Followed by [facility] hospice/palliative care   Urinary tract infection described on admission- she was asymptomatic and urinalysis not consistent with infection. Had Klebsiella UTI 12/09/2021. Has other more pertinent sources of her current state. Received initial dose of rocephin in ED. Will discontinue at this time but will continue to follow urine cultures. Low threshold to restart.  - follow urine output - follow Ucx  Dementia- very pleasant. Mildly confused. Oriented to self and place but not date or circumstances this am  Body  mass index is 28.35 kg/m.  VTE ppx:  apixaban (ELIQUIS) tablet 5 mg   Diet:     Diet   Diet regular Room service appropriate? Yes; Fluid consistency: Thin   Consultants: none Subjective 01/19/22    Pt reports no complaints. Denies  pain, dyspnea.    Objective   Vitals:   01/18/22 1300 01/18/22 1634 01/18/22 2001 01/19/22 0725  BP:  (!) 109/55 (!) 118/57 (!) 129/92  Pulse:  99 79 77  Resp:  18 17 16   Temp:   98.2 F (36.8 C) 98.4 F (36.9 C)  TempSrc:    Oral  SpO2: 92% 90% 95% 96%  Weight:      Height:        Intake/Output Summary (Last 24 hours) at 01/19/2022 0751 Last data filed at 01/19/2022 0725 Gross per 24 hour  Intake --  Output 1000 ml  Net -1000 ml    Filed Weights   01/17/22 0418  Weight: 72.6 kg     Physical Exam:  General: awake, alert, NAD HEENT: atraumatic, clear conjunctiva, anicteric sclera, MMM, hearing grossly normal Respiratory: normal respiratory effort. CTAB Cardiovascular: quick capillary refill, normal S1/S2, RRR, no JVD, murmurs Nervous: A&O x2. no gross focal neurologic deficits, normal speech Extremities: moves all equally, no edema, normal tone Skin: dry, intact, normal temperature, normal color. No rashes, lesions or ulcers on exposed skin Psychiatry: normal mood, congruent affect  Labs   I have personally reviewed the following labs and imaging studies CBC    Component Value Date/Time   WBC 8.2 01/19/2022 0527   RBC 3.72 (L) 01/19/2022 0527   HGB 11.5 (L) 01/19/2022 0527   HCT 35.8 (L) 01/19/2022 0527   PLT 198 01/19/2022 0527   MCV 96.2 01/19/2022 0527   MCH 30.9 01/19/2022 0527   MCHC 32.1 01/19/2022 0527   RDW 13.9 01/19/2022 0527   LYMPHSABS 1.1 01/16/2022 2358   MONOABS 0.9 01/16/2022 2358   EOSABS 0.2 01/16/2022 2358   BASOSABS 0.0 01/16/2022 2358      Latest Ref Rng & Units 01/19/2022    5:27 AM 01/18/2022    6:51 AM 01/16/2022   11:58 PM  BMP  Glucose 70 - 99 mg/dL 132  83  92   BUN 8 - 23 mg/dL 17  16  26    Creatinine 0.44 - 1.00 mg/dL 0.81  0.87  1.06   Sodium 135 - 145 mmol/L 140  142  141   Potassium 3.5 - 5.1 mmol/L 4.4  3.7  3.5   Chloride 98 - 111 mmol/L 108  110  105   CO2 22 - 32 mmol/L 29  30  28    Calcium 8.9 - 10.3 mg/dL 9.0   8.7  9.1     No results found.  Disposition Plan & Communication  Patient status: Inpatient  Admitted From: SNF Planned disposition location: Skilled nursing facility Anticipated discharge date: 11/17 pending covid restrictions for discharging facility  Family Communication: none    Author: Richarda Osmond, DO [facility] 01/19/2022, 7:51 AM   Available by Epic secure chat 7AM-7PM. If 7PM-7AM, please contact night-coverage.  [facility] contact information found on [email or web address].

## 2022-01-20 DIAGNOSIS — J9601 Acute respiratory failure with hypoxia: Secondary | ICD-10-CM | POA: Diagnosis not present

## 2022-01-20 DIAGNOSIS — J129 Viral pneumonia, unspecified: Secondary | ICD-10-CM

## 2022-01-20 DIAGNOSIS — W19XXXA Unspecified fall, initial encounter: Secondary | ICD-10-CM | POA: Diagnosis not present

## 2022-01-20 DIAGNOSIS — U071 COVID-19: Secondary | ICD-10-CM | POA: Diagnosis not present

## 2022-01-20 NOTE — Progress Notes (Signed)
PROGRESS NOTE  Jane Perez    DOB: 03-12-1930, 88 y.o.  ZOX:[id number]    Code Status: Full Code   DOA: 01/16/2022   LOS: 3   Brief hospital course  Jane Perez is a 88 y.o. 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  They presented from SNF to the ED on 01/16/2022 with fall. Has frequent falls as demonstrated by multiple healed fractures on admission imaging.  In the ED, it was found that they had  BP 101/59 with otherwise normal vitals.Labs with troponin of 10, BNP 148.  Hemoglobin at baseline at 11.9.  Creatinine near baseline at 1.06.  COVID-positive, urinalysis with moderate leukocyte esterase and many bacteria.   Significant findings included CT head and C-spine T-spine L-spine showing remote compression deformities of T2-T3-T4 and L1 CTA chest negative for PE and showing patchy airspace opacities in the left lower lobe suspicious for pneumonia among other findings including remote rib and clavicle fractures.  They were initially treated with paxlovid, steroids and supplemental oxygen.   Patient was admitted to medicine service for further workup and management of covid 19 as outlined in detail below.  01/20/22 -stable, improving and weaned to room air. Barrier to discharge is SNF acceptance policy s/p COVID  Assessment & Plan  Principal Problem:   Fall Active Problems:   Pneumonia due to COVID-19 virus   Hypoxia   Urinary tract infection   History of bladder cancer   History of DVT (deep vein thrombosis)   Pneumonia   Acute respiratory failure with hypoxia (HCC)   Closed nondisplaced fracture of right clavicle   COVID-19  Patient does NOT meet sepsis criteria.   Fall resulting in no significant injuries. Imaging reveals several old rib fractures and proximal clavicle fracture R with  indeterminate age. Also age indeterminate compression fracture of T4 History of frequent falls Multifactorial in setting of acute illness, hypotension, bradycardia. CT head nonacute. Will need continuous monitoring and further workup.  - continuous cardiac telemetry - maintenance IV fluids - Fall precautions and Jane Perez eval/treatment - ortho curbside on clavicle fracture and does not recommend intervention. Likely subacute given patient not complaining of pain  Hypotension- stable - stopped IV maintenance fluids - hold home BP meds   Pneumonia due to COVID-19 virus- Hypoxic to 89% on presentation, now stable on room air. CTA negative for acute PE. CTA showed patchy airspace opcaities bilaterally greatest in left lower lobe suspicious for pneumonia. Procalcitonin normal. Less likely to have secondary bacterial pna - continue Paxlovid, antitussives, albuterol as needed             - paxlovid changed to molnupiravir x5 days due to interaction with eliquis - Oral dexamethasone initiated - Incentive spirometer and supportive care - follow blood cultures   History of DVT (deep vein thrombosis) Continue Eliquis   History of bladder cancer History of lung cancer Followed by [facility] hospice/palliative care   Urinary tract infection described on admission- she was asymptomatic and urinalysis not consistent with infection. Had Klebsiella UTI 12/09/2021. Received initial dose of rocephin in ED. Urine cx positive for multiple species including proteus mirabilis and enterococcus faecalis. She remains asymptomatic and stable. Will continue to monitor off Abx - follow urine output  Dementia- very pleasant. Mildly confused. Oriented to self and place but not date or circumstances this am  Body mass  index is 28.35 kg/m.  VTE ppx:  apixaban (ELIQUIS) tablet 5 mg   Diet:     Diet   Diet regular Room service appropriate? Yes; Fluid consistency: Thin   Consultants: None  Subjective 01/20/22     Jane Perez reports no complaints. Denies pain, dyspnea, dysuria. She would like more coffee since she spilled hers.    Objective   Vitals:   01/19/22 0800 01/19/22 1530 01/20/22 0048 01/20/22 0818  BP:  117/60 126/66 126/66  Pulse:  83 71 70  Resp:  17 16 16   Temp:  98.1 F (36.7 C) 98.8 F (37.1 C) 97.8 F (36.6 C)  TempSrc:    Oral  SpO2: 92% 95% 90% 95%  Weight:      Height:        Intake/Output Summary (Last 24 hours) at 01/20/2022 0839 Last data filed at 01/20/2022 0820 Gross per 24 hour  Intake --  Output 1550 ml  Net -1550 ml    Filed Weights   01/17/22 0418  Weight: 72.6 kg     Physical Exam:  General: awake, alert, NAD HEENT: atraumatic, clear conjunctiva, anicteric sclera, MMM, hearing grossly normal Respiratory: normal respiratory effort. CTAB Cardiovascular: quick capillary refill, normal S1/S2, RRR, no JVD, murmurs Nervous: A&O x2. no gross focal neurologic deficits, normal speech Extremities: moves all equally, no edema, normal tone Skin: dry, intact, normal temperature, normal color. No rashes, lesions or ulcers on exposed skin Psychiatry: normal mood, congruent affect  Labs   I have personally reviewed the following labs and imaging studies CBC    Component Value Date/Time   WBC 8.2 01/19/2022 0527   RBC 3.72 (L) 01/19/2022 0527   HGB 11.5 (L) 01/19/2022 0527   HCT 35.8 (L) 01/19/2022 0527   PLT 198 01/19/2022 0527   MCV 96.2 01/19/2022 0527   MCH 30.9 01/19/2022 0527   MCHC 32.1 01/19/2022 0527   RDW 13.9 01/19/2022 0527   LYMPHSABS 1.1 01/16/2022 2358   MONOABS 0.9 01/16/2022 2358   EOSABS 0.2 01/16/2022 2358   BASOSABS 0.0 01/16/2022 2358      Latest Ref Rng & Units 01/19/2022    5:27 AM 01/18/2022    6:51 AM 01/16/2022   11:58 PM  BMP  Glucose 70 - 99 mg/dL 132  83  92   BUN 8 - 23 mg/dL 17  16  26    Creatinine 0.44 - 1.00 mg/dL 0.81  0.87  1.06   Sodium 135 - 145 mmol/L 140  142  141   Potassium 3.5 - 5.1 mmol/L 4.4  3.7  3.5    Chloride 98 - 111 mmol/L 108  110  105   CO2 22 - 32 mmol/L 29  30  28    Calcium 8.9 - 10.3 mg/dL 9.0  8.7  9.1     No results found.  Disposition Plan & Communication  Patient status: Inpatient  Admitted From: SNF Planned disposition location: Skilled nursing facility Anticipated discharge date: 11/17 pending covid restrictions for discharging facility  Family Communication: none    Author: Richarda Osmond, DO [facility] 01/20/2022, 8:39 AM   Available by Epic secure chat 7AM-7PM. If 7PM-7AM, please contact night-coverage.  [facility] contact information found on [email or web address].

## 2022-01-21 DIAGNOSIS — W19XXXA Unspecified fall, initial encounter: Secondary | ICD-10-CM | POA: Diagnosis not present

## 2022-01-21 DIAGNOSIS — U071 COVID-19: Secondary | ICD-10-CM | POA: Diagnosis not present

## 2022-01-21 DIAGNOSIS — J9601 Acute respiratory failure with hypoxia: Secondary | ICD-10-CM | POA: Diagnosis not present

## 2022-01-21 DIAGNOSIS — J129 Viral pneumonia, unspecified: Secondary | ICD-10-CM | POA: Diagnosis not present

## 2022-01-21 MED ORDER — ADULT MULTIVITAMIN W/MINERALS CH
1.0000 | ORAL_TABLET | Freq: Every day | ORAL | Status: DC
  Administered 2022-01-21 – 2022-01-29 (×9): 1 via ORAL
  Filled 2022-01-21 (×9): qty 1

## 2022-01-21 MED ORDER — ENSURE ENLIVE PO LIQD
237.0000 mL | Freq: Three times a day (TID) | ORAL | Status: DC
  Administered 2022-01-21 – 2022-01-29 (×16): 237 mL via ORAL

## 2022-01-21 NOTE — Progress Notes (Signed)
PROGRESS NOTE  Jane Perez    DOB: Sep 23, 1930, 88 y.o.  RCV:[id number]    Code Status: Full Code   DOA: 01/16/2022   LOS: 4   Brief hospital course  Jane Perez is a 88 y.o. 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  They presented from SNF to the ED on 01/16/2022 with fall. Has frequent falls as demonstrated by multiple healed fractures on admission imaging.  In the ED, it was found that they had  BP 101/59 with otherwise normal vitals.Labs with troponin of 10, BNP 148.  Hemoglobin at baseline at 11.9.  Creatinine near baseline at 1.06.  COVID-positive, urinalysis with moderate leukocyte esterase and many bacteria.   Significant findings included CT head and C-spine T-spine L-spine showing remote compression deformities of T2-T3-T4 and L1 CTA chest negative for PE and showing patchy airspace opacities in the left lower lobe suspicious for pneumonia among other findings including remote rib and clavicle fractures.  They were initially treated with paxlovid, steroids and supplemental oxygen.   Patient was admitted to medicine service for further workup and management of covid 19 as outlined in detail below.  01/21/22 -stable, improving and weaned to room air. Barrier to discharge is SNF acceptance policy s/p COVID  Assessment & Plan  Principal Problem:   Fall Active Problems:   Pneumonia due to COVID-19 virus   Hypoxia   Urinary tract infection   History of bladder cancer   History of DVT (deep vein thrombosis)   Pneumonia   Acute respiratory failure with hypoxia (HCC)   Closed nondisplaced fracture of right clavicle   COVID-19   Viral pneumonia  Patient does NOT meet sepsis criteria.   Fall resulting in no significant injuries. Imaging reveals several old rib fractures and proximal clavicle fracture  R with indeterminate age. Also age indeterminate compression fracture of T4 History of frequent falls Multifactorial in setting of acute illness, hypotension, bradycardia. CT head nonacute. Will need continuous monitoring and further workup.  - Fall precautions and PT eval/treatment - ortho curbside on clavicle fracture and does not recommend intervention. Likely subacute given patient not complaining of pain  Hypotension- stable - stopped IV maintenance fluids - hold home BP meds   Pneumonia due to COVID-19 virus- Hypoxic to 89% on presentation, now stable on room air. CTA negative for acute PE. CTA showed patchy airspace opcaities bilaterally greatest in left lower lobe suspicious for pneumonia. Procalcitonin normal. Less likely to have secondary bacterial pna - continue Paxlovid, antitussives, albuterol as needed             - paxlovid changed to molnupiravir x5 days due to interaction with eliquis - Oral dexamethasone completed - Incentive spirometer and supportive care - follow blood cultures   History of DVT (deep vein thrombosis) Continue Eliquis   History of bladder cancer History of lung cancer Followed by [facility] hospice/palliative care   Urinary tract infection described on admission- she was asymptomatic and urinalysis not consistent with infection. Had Klebsiella UTI 12/09/2021. Received initial dose of rocephin in ED. Urine cx positive for multiple species including proteus mirabilis and enterococcus faecalis. She remains asymptomatic and stable. Will continue to monitor off Abx - follow urine output  Dementia- very pleasant. Mildly confused. Oriented to self and place but not date or circumstances this am  Body mass index is 28.35 kg/m.  VTE ppx:  apixaban (ELIQUIS) tablet 5 mg   Diet:     Diet   Diet regular Room service appropriate? Yes; Fluid consistency: Thin   Consultants: None  Subjective 01/21/22    Pt reports no complaints. Denies pain, dyspnea,  dysuria. She would like more coffee since she spilled hers.    Objective   Vitals:   01/20/22 0048 01/20/22 0818 01/21/22 0104 01/21/22 0802  BP: 126/66 126/66 125/70 128/68  Pulse: 71 70 75 75  Resp: 16 16 20    Temp: 98.8 F (37.1 C) 97.8 F (36.6 C) 98.2 F (36.8 C) 98.8 F (37.1 C)  TempSrc:  Oral    SpO2: 90% 95% 94%   Weight:      Height:       No intake or output data in the 24 hours ending 01/21/22 0832  Filed Weights   01/17/22 0418  Weight: 72.6 kg     Physical Exam:  General: awake, alert, NAD HEENT: atraumatic, clear conjunctiva, anicteric sclera, MMM, hearing grossly normal Respiratory: normal respiratory effort. CTAB Cardiovascular: quick capillary refill, normal S1/S2, RRR, no JVD, murmurs Nervous: A&O x2. no gross focal neurologic deficits, normal speech Extremities: moves all equally, no edema, normal tone Skin: dry, intact, normal temperature, normal color. No rashes, lesions or ulcers on exposed skin Psychiatry: normal mood, congruent affect  Labs   I have personally reviewed the following labs and imaging studies CBC    Component Value Date/Time   WBC 8.2 01/19/2022 0527   RBC 3.72 (L) 01/19/2022 0527   HGB 11.5 (L) 01/19/2022 0527   HCT 35.8 (L) 01/19/2022 0527   PLT 198 01/19/2022 0527   MCV 96.2 01/19/2022 0527   MCH 30.9 01/19/2022 0527   MCHC 32.1 01/19/2022 0527   RDW 13.9 01/19/2022 0527   LYMPHSABS 1.1 01/16/2022 2358   MONOABS 0.9 01/16/2022 2358   EOSABS 0.2 01/16/2022 2358   BASOSABS 0.0 01/16/2022 2358      Latest Ref Rng & Units 01/19/2022    5:27 AM 01/18/2022    6:51 AM 01/16/2022   11:58 PM  BMP  Glucose 70 - 99 mg/dL 132  83  92   BUN 8 - 23 mg/dL 17  16  26    Creatinine 0.44 - 1.00 mg/dL 0.81  0.87  1.06   Sodium 135 - 145 mmol/L 140  142  141   Potassium 3.5 - 5.1 mmol/L 4.4  3.7  3.5   Chloride 98 - 111 mmol/L 108  110  105   CO2 22 - 32 mmol/L 29  30  28    Calcium 8.9 - 10.3 mg/dL 9.0  8.7  9.1     No  results found.  Disposition Plan & Communication  Patient status: Inpatient  Admitted From: SNF Planned disposition location: Skilled nursing facility Anticipated discharge date: 11/17 pending covid restrictions for discharging facility  Family Communication: son on phone   Author: Richarda Osmond, DO [facility] 01/21/2022, 8:32 AM   Available by Epic secure chat 7AM-7PM. If 7PM-7AM, please contact night-coverage.  [facility] contact information found on [email or web address].

## 2022-01-21 NOTE — Plan of Care (Signed)
  Problem: Respiratory: Goal: Will maintain a patent airway Outcome: Progressing   Problem: Education: Goal: Knowledge of General Education information will improve Description: Including pain rating scale, medication(s)/side effects and non-pharmacologic comfort measures Outcome: Progressing   Problem: Clinical Measurements: Goal: Ability to maintain clinical measurements within normal limits will improve Outcome: Progressing   Problem: Coping: Goal: Level of anxiety will decrease Outcome: Progressing   Problem: Safety: Goal: Ability to remain free from injury will improve Outcome: Progressing

## 2022-01-21 NOTE — Progress Notes (Signed)
Initial Nutrition Assessment  DOCUMENTATION CODES:   Not applicable  INTERVENTION:   -Continue with regular diet -MVI with minerals daily -Feeding assistance with meals -Ensure Enlive po TID, each supplement provides 350 kcal and 20 grams of protein  NUTRITION DIAGNOSIS:   Inadequate oral intake related to poor appetite as evidenced by meal completion < 50%.  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  Pt admitted with fall with no significant injuries.   Reviewed I/O's: -650 ml x 24 hours and +1.5 L since admission  UOP: 650 ml x 24 hours  Pt sitting up in bed, pleasantly confused at time of visit. Pt reports good appetite, however, consumed only a few bites of pancake and sausage. Noted pt consumed multiple cups of fluids.    Reviewed wt hx; pt has experienced a 1.9% wt loss over the past 4 months, which is not significant for time frame.   Pt awaiting SNF placement; will discharge once completed quarantine.   Labs reviewed.   NUTRITION - FOCUSED PHYSICAL EXAM:  Flowsheet Row Most Recent Value  Orbital Region No depletion  Upper Arm Region No depletion  Thoracic and Lumbar Region No depletion  Buccal Region No depletion  Temple Region No depletion  Clavicle Bone Region No depletion  Clavicle and Acromion Bone Region No depletion  Scapular Bone Region No depletion  Dorsal Hand No depletion  Patellar Region No depletion  Anterior Thigh Region No depletion  Posterior Calf Region No depletion  Edema (RD Assessment) None  Hair Reviewed  Eyes Reviewed  Mouth Reviewed  Skin Reviewed  Nails Reviewed       Diet  Order:   Diet Order             Diet regular Room service appropriate? Yes; Fluid consistency: Thin  Diet effective now                   EDUCATION NEEDS:   Education needs have been addressed  Skin:  Skin Assessment: Skin Integrity Issues: Skin Integrity Issues:: Other (Comment) Other: skin breakdown to buttocks  Last BM:  01/21/22  Height:   Ht Readings from Last 1 Encounters:  01/17/22 5\' 3"  (1.6 m)    Weight:   Wt Readings from Last 1 Encounters:  01/17/22 72.6 kg    Ideal Body Weight:  52.3 kg  BMI:  Body mass index is 28.35 kg/m.  Estimated Nutritional Needs:   Kcal:  1550-1750  Protein:  80-95 grams  Fluid:  > 1.5 L    Loistine Chance, RD, LDN, [place] Registered Dietitian II Certified Diabetes Care and Education Specialist Please refer to [facility] for RD and/or RD on-call/weekend/after hours pager

## 2022-01-21 NOTE — Evaluation (Signed)
Occupational Therapy Evaluation Patient Details Name: Jane Perez MRN: [id number] DOB: [birth year] Today's Date: 01/21/2022   History of Present Illness Jane Perez is a 88 y.o. 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 Admitted  on 01/16/2022 with fall and found to be COVID +. Has frequent falls as demonstrated by multiple healed fractures on admission imaging   Clinical Impression   [name] was seen for OT evaluation this date. Prior to hospital admission, pt required assist at ALF, hx of falls. Pt presents to acute OT demonstrating impaired ADL performance and functional mobility 2/2 decreased activity tolerance and functional strength/ROM/balance deficits. Oriented to self and month only. Pt found with large BM in bed, poor insight. MIN A rolling bed level, maintains sidelying with use of rails. MAX A pericare. MIN A don/doff gown bed level. Pt reports fatigue from toileting, will assess OOB next date as able. Pt would benefit from skilled OT to address noted impairments and functional limitations (see below for any additional details). Upon hospital discharge, recommend STR to maximize pt safety and return to PLOF.    Recommendations for follow up therapy are one component of a multi-disciplinary discharge planning process, led by the attending physician.  Recommendations may be updated based on patient status, additional functional criteria and insurance authorization.   Follow Up Recommendations  Skilled nursing-short term rehab (<3 hours/day)     Assistance Recommended at Discharge Frequent or constant Supervision/Assistance  Patient can return home with the following Two people to help with walking and/or transfers;Two people to help with bathing/dressing/bathroom    Functional Status Assessment  Patient  has had a recent decline in their functional status and demonstrates the ability to make significant improvements in function in a reasonable and predictable amount of time.  Equipment Recommendations  Other (comment) (defer)    Recommendations for Other Services       Precautions / Restrictions Precautions Precautions: Fall Restrictions Weight Bearing Restrictions: No      Mobility Bed Mobility Overal bed mobility: Needs Assistance Bed Mobility: Rolling Rolling: Min assist              Transfers                   General transfer comment: deferred as pt required significant time to complete toileting at bed level, pt perseverative on "II have to get up tmrw"          ADL either performed or assessed with clinical judgement   ADL Overall ADL's : Needs assistance/impaired                                       General ADL Comments: MAX A pericare bed level. MIN A don gown at bed level      Pertinent Vitals/Pain Pain Assessment Pain Assessment: No/denies pain     Hand Dominance     Extremity/Trunk Assessment Upper Extremity Assessment Upper Extremity Assessment: Generalized weakness   Lower Extremity Assessment Lower Extremity Assessment: Generalized weakness       Communication Communication Communication: No difficulties   Cognition Arousal/Alertness: Awake/alert Behavior During Therapy: WFL for tasks assessed/performed Overall Cognitive Status: No family/caregiver present to determine baseline cognitive functioning  General Comments: Very pleasant but somewhat confused. Oriented to self and month, states we are at home                [place] expects to be discharged to:: Assisted living                             Home Equipment: Caban (2 wheels)   Additional Comments: from the [facility] per chart review. Pt is poor historian and  reports she is from home alone with 2 STE and railing.      Prior Functioning/Environment Prior Level of Function : Independent/Modified Independent             Mobility Comments: hx of falls          OT Problem List: Decreased strength;Decreased range of motion;Decreased activity tolerance;Impaired balance (sitting and/or standing);Decreased cognition;Decreased safety awareness      OT Treatment/Interventions: Self-care/ADL training;Therapeutic exercise;DME and/or AE instruction;Energy conservation;Therapeutic activities;Balance training    OT Goals(Current goals can be found in the care plan section) Acute Rehab OT Goals Patient Stated Goal: to get up tmrw OT Goal Formulation: With patient Time For Goal Achievement: 02/04/22 Potential to Achieve Goals: Fair ADL Goals Pt Will Perform Grooming: with set-up;with supervision;sitting Pt Will Perform Lower Body Dressing: with min assist;sitting/lateral leans Pt Will Transfer to Toilet: with mod assist;squat pivot transfer;bedside commode  OT Frequency: Min 2X/week    Co-evaluation              AM-PAC OT "6 Clicks" Daily Activity     Outcome Measure Help from another person eating meals?: A Little Help from another person taking care of personal grooming?: A Little Help from another person toileting, which includes using toliet, bedpan, or urinal?: A Lot Help from another person bathing (including washing, rinsing, drying)?: A Lot Help from another person to put on and taking off regular upper body clothing?: A Little Help from another person to put on and taking off regular lower body clothing?: A Lot 6 Click Score: 15   End of Session Nurse Communication: Mobility status  Activity Tolerance: Patient tolerated treatment well Patient left: in bed;with call bell/phone within reach;with bed alarm set  OT Visit Diagnosis: Other abnormalities of gait and mobility (R26.89);Muscle weakness (generalized) (M62.81)                 Time: 9528-4132 OT Time Calculation (min): 29 min Charges:  OT General Charges $OT Visit: 1 Visit OT Evaluation $OT Eval Moderate Complexity: 1 Mod OT Treatments $Self Care/Home Management : 8-22 mins  Dessie Coma, M.S. OTR/L  01/21/22, 4:24 PM  ascom [phone]

## 2022-01-22 DIAGNOSIS — W19XXXA Unspecified fall, initial encounter: Secondary | ICD-10-CM | POA: Diagnosis not present

## 2022-01-22 DIAGNOSIS — J9601 Acute respiratory failure with hypoxia: Secondary | ICD-10-CM | POA: Diagnosis not present

## 2022-01-22 DIAGNOSIS — A419 Sepsis, unspecified organism: Secondary | ICD-10-CM

## 2022-01-22 DIAGNOSIS — U071 COVID-19: Secondary | ICD-10-CM | POA: Diagnosis not present

## 2022-01-22 LAB — CULTURE, BLOOD (ROUTINE X 2)
Culture: NO GROWTH
Culture: NO GROWTH
Special Requests: ADEQUATE

## 2022-01-22 MED ORDER — PSEUDOEPHEDRINE HCL ER 120 MG PO TB12
120.0000 mg | ORAL_TABLET | Freq: Two times a day (BID) | ORAL | Status: AC
  Administered 2022-01-22 – 2022-01-23 (×4): 120 mg via ORAL
  Filled 2022-01-22 (×4): qty 1

## 2022-01-22 MED ORDER — DM-GUAIFENESIN ER 30-600 MG PO TB12
1.0000 | ORAL_TABLET | Freq: Two times a day (BID) | ORAL | Status: AC
  Administered 2022-01-22 (×2): 1 via ORAL
  Filled 2022-01-22 (×2): qty 1

## 2022-01-22 NOTE — Plan of Care (Signed)

## 2022-01-22 NOTE — Progress Notes (Signed)
PROGRESS NOTE  Jane Perez    DOB: 03/09/1931, 88 y.o.  IRW:[id number]    Code Status: Full Code   DOA: 01/16/2022   LOS: 5   Brief hospital course  Jane Perez is a 88 y.o. 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  They presented from SNF to the ED on 01/16/2022 with fall. Has frequent falls as demonstrated by multiple healed fractures on admission imaging.  In the ED, it was found that they had  BP 101/59 with otherwise normal vitals.Labs with troponin of 10, BNP 148.  Hemoglobin at baseline at 11.9.  Creatinine near baseline at 1.06.  COVID-positive, urinalysis with moderate leukocyte esterase and many bacteria.   Significant findings included CT head and C-spine T-spine L-spine showing remote compression deformities of T2-T3-T4 and L1 CTA chest negative for PE and showing patchy airspace opacities in the left lower lobe suspicious for pneumonia among other findings including remote rib and clavicle fractures.  They were initially treated with paxlovid, steroids and supplemental oxygen.   Patient was admitted to medicine service for further workup and management of covid 19 as outlined in detail below.  01/22/22 -stable, improving and weaned to room air. Barrier to discharge is SNF acceptance policy s/p COVID. She is in her bed every day and seems to be slumped down more each day. To avoid atelectasis and secondary infection from hypoventilation, I highly encourage moving patient to chair and mobilizing as much as possible. Provide incentive spirometer and instructions on use.  Assessment & Plan  Principal Problem:   Fall Active Problems:   Pneumonia due to COVID-19 virus   Hypoxia   Urinary tract infection   History of bladder cancer   History of DVT (deep vein thrombosis)   Pneumonia   Acute  respiratory failure with hypoxia (HCC)   Closed nondisplaced fracture of right clavicle   COVID-19   Viral pneumonia  Patient does NOT meet sepsis criteria.   Fall resulting in no significant injuries. Imaging reveals several old rib fractures and proximal clavicle fracture R with indeterminate age. Also age indeterminate compression fracture of T4 History of frequent falls Multifactorial in setting of acute illness, hypotension, bradycardia. CT head nonacute. Will need continuous monitoring and further workup.  - Fall precautions and PT eval/treatment - ortho curbside on clavicle fracture and does not recommend intervention. Likely subacute given patient not complaining of pain  Hypotension- stable - stopped IV maintenance fluids - hold home BP meds   Pneumonia due to COVID-19 virus- Hypoxic to 89% on presentation, now stable on room air. CTA negative for acute PE. CTA showed patchy airspace opcaities bilaterally greatest in left lower lobe suspicious for pneumonia. Procalcitonin normal. Less likely to have secondary bacterial pna. Blood cultures negative.  - continue Paxlovid, antitussives, albuterol as needed             - paxlovid changed to molnupiravir x5 days due to interaction with eliquis completed today. - Oral dexamethasone completed - Incentive spirometer and supportive care   History of DVT (deep vein thrombosis) Continue Eliquis   History of bladder cancer History of lung cancer Followed by [facility] hospice/palliative care   Urinary tract infection described on admission- she was asymptomatic and urinalysis not consistent with infection. Had Klebsiella UTI 12/09/2021. Received initial dose of rocephin in ED. Urine cx positive for multiple species  including proteus mirabilis and enterococcus faecalis. She remains asymptomatic and stable. Will continue to monitor off Abx - follow urine output  Dementia- very pleasant. Mildly confused. Oriented to self and place but not  date or circumstances this am - OOB as much as possible  Body mass index is 28.35 kg/m.  VTE ppx:  apixaban (ELIQUIS) tablet 5 mg   Diet:     Diet   Diet regular Room service appropriate? Yes; Fluid consistency: Thin   Consultants: None  Subjective 01/22/22    Pt reports no complaints. Denies pain, dyspnea, dysuria.   Objective   Vitals:   01/21/22 0802 01/21/22 1655 01/21/22 2249 01/22/22 0228  BP: 128/68 120/79 114/81 108/67  Pulse: 75 88 81 83  Resp:   16 18  Temp: 98.8 F (37.1 C) (!) 97.5 F (36.4 C) 99.8 F (37.7 C) 98.5 F (36.9 C)  TempSrc:      SpO2:  94% 93% 93%  Weight:      Height:        Intake/Output Summary (Last 24 hours) at 01/22/2022 0753 Last data filed at 01/22/2022 0229 Gross per 24 hour  Intake --  Output 1150 ml  Net -1150 ml    Filed Weights   01/17/22 0418  Weight: 72.6 kg     Physical Exam:  General: awake, alert, NAD HEENT: atraumatic, clear conjunctiva, anicteric sclera, MMM, hearing grossly normal Respiratory: normal respiratory effort. CTAB Cardiovascular: quick capillary refill, normal S1/S2, RRR, no JVD, murmurs Nervous: A&O x2. no gross focal neurologic deficits, normal speech Extremities: moves all equally, no edema, normal tone Skin: dry, intact, normal temperature, normal color. No rashes, lesions or ulcers on exposed skin Psychiatry: normal mood, congruent affect  Labs   I have personally reviewed the following labs and imaging studies CBC    Component Value Date/Time   WBC 8.2 01/19/2022 0527   RBC 3.72 (L) 01/19/2022 0527   HGB 11.5 (L) 01/19/2022 0527   HCT 35.8 (L) 01/19/2022 0527   PLT 198 01/19/2022 0527   MCV 96.2 01/19/2022 0527   MCH 30.9 01/19/2022 0527   MCHC 32.1 01/19/2022 0527   RDW 13.9 01/19/2022 0527   LYMPHSABS 1.1 01/16/2022 2358   MONOABS 0.9 01/16/2022 2358   EOSABS 0.2 01/16/2022 2358   BASOSABS 0.0 01/16/2022 2358      Latest Ref Rng & Units 01/19/2022    5:27 AM 01/18/2022     6:51 AM 01/16/2022   11:58 PM  BMP  Glucose 70 - 99 mg/dL 132  83  92   BUN 8 - 23 mg/dL 17  16  26    Creatinine 0.44 - 1.00 mg/dL 0.81  0.87  1.06   Sodium 135 - 145 mmol/L 140  142  141   Potassium 3.5 - 5.1 mmol/L 4.4  3.7  3.5   Chloride 98 - 111 mmol/L 108  110  105   CO2 22 - 32 mmol/L 29  30  28    Calcium 8.9 - 10.3 mg/dL 9.0  8.7  9.1     No results found.  Disposition Plan & Communication  Patient status: Inpatient  Admitted From: SNF Planned disposition location: Skilled nursing facility Anticipated discharge date: 11/17 pending covid restrictions for discharging facility  Family Communication: son on phone   Author: Richarda Osmond, DO [facility] 01/22/2022, 7:53 AM   Available by Epic secure chat 7AM-7PM. If 7PM-7AM, please contact night-coverage.  [facility] contact information found on [email or web address].

## 2022-01-22 NOTE — Plan of Care (Signed)

## 2022-01-22 NOTE — Progress Notes (Signed)
Physical Therapy Treatment Patient Details Name: Jane Perez MRN: [id number] DOB: Dec 20, 1930 Today's Date: 01/22/2022   History of Present Illness Jane Perez is an 88 y.o. 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 Admitted  on 01/16/2022 with fall and found to be COVID + with PNA. Has frequent falls as demonstrated by multiple healed fractures on admission imaging including remote T2-4 and L1 compression fractures.    PT Comments    Pt was pleasant and motivated to participate during the session and put forth good effort throughout. Pt continued to require extensive physical assistance with bed mobility tasks and occasional assist to maintain static sitting balance at EOB.  Pt was actually able to clear the surface of the bed during transfer training this session but only with extensive physical assistance and time spent improving pt's ability to anteriorly weight shift.  Pt will benefit from PT services in a SNF setting upon discharge to safely address deficits listed in patient problem list for decreased caregiver assistance and eventual return to PLOF.     Recommendations for follow up therapy are one component of a multi-disciplinary discharge planning process, led by the attending physician.  Recommendations may be updated based on patient status, additional functional criteria and insurance authorization.  Follow Up Recommendations  Skilled nursing-short term rehab (<3 hours/day) Can patient physically be transported by private vehicle: No   Assistance Recommended at Discharge Frequent or constant Supervision/Assistance  Patient can return home with the following Two people to help with walking and/or transfers;Two people to help with bathing/dressing/bathroom;Assist for transportation;Help with stairs or  ramp for entrance   Equipment Recommendations  Other (comment) (TBD at next venue of care)    Recommendations for Other Services       Precautions / Restrictions Precautions Precautions: Fall Restrictions Weight Bearing Restrictions: No     Mobility  Bed Mobility Overal bed mobility: Needs Assistance Bed Mobility: Rolling Rolling: Min assist   Supine to sit: Mod assist Sit to supine: Mod assist   General bed mobility comments: Mod A for BLE and trunk control with sup to/from sit    Transfers Overall transfer level: Needs assistance Equipment used: Rolling walker (2 wheels) Transfers: Sit to/from Stand Sit to Stand: Max assist, Mod assist, From elevated surface           General transfer comment: Pt required extensive physical assist to clear the surface of the bed during sit to stand transfer training and was unable to come to full upright standing    Ambulation/Gait               General Gait Details: unable   Stairs             Wheelchair Mobility    Modified Rankin (Stroke Patients Only)       Balance Overall balance assessment: History of Falls, Needs assistance Sitting-balance support: Bilateral upper extremity supported, Feet supported Sitting balance-Leahy Scale: Poor Sitting balance - Comments: Frequent posterior lean Postural control: Posterior lean Standing balance support: Bilateral upper extremity supported, Reliant on assistive device for balance Standing balance-Leahy Scale: Poor                              Cognition Arousal/Alertness: Awake/alert Behavior During Therapy: WFL for tasks assessed/performed Overall Cognitive Status: No  family/caregiver present to determine baseline cognitive functioning                                          Exercises Total Joint Exercises Ankle Circles/Pumps: AROM, Strengthening, Both, 10 reps, 5 reps Quad Sets: Strengthening, Both, 5 reps, 10 reps Heel  Slides: AAROM, Strengthening, Both, 10 reps Hip ABduction/ADduction: AAROM, Strengthening, Both, 5 reps, 10 reps Straight Leg Raises: AAROM, Strengthening, Both, 5 reps, 10 reps Long Arc Quad: AAROM, Strengthening, Both, 10 reps, 5 reps Knee Flexion: AAROM, Strengthening, Both, 5 reps, 10 reps Other Exercises Other Exercises: Extensive anterior weight shifting activities in sitting at EOB to fascilitate improved sit to stand performance    General Comments        Pertinent Vitals/Pain Pain Assessment Pain Assessment: No/denies pain    Home Living                          Prior Function            PT Goals (current goals can now be found in the care plan section) Progress towards PT goals: Progressing toward goals    Frequency    Min 2X/week      PT Plan Current plan remains appropriate    Co-evaluation              AM-PAC PT "6 Clicks" Mobility   Outcome Measure  Help needed turning from your back to your side while in a flat bed without using bedrails?: A Lot Help needed moving from lying on your back to sitting on the side of a flat bed without using bedrails?: A Lot Help needed moving to and from a bed to a chair (including a wheelchair)?: Total Help needed standing up from a chair using your arms (e.g., wheelchair or bedside chair)?: A Lot Help needed to walk in hospital room?: Total Help needed climbing 3-5 steps with a railing? : Total 6 Click Score: 9    End of Session Equipment Utilized During Treatment: Oxygen Activity Tolerance: Patient tolerated treatment well Patient left: in bed;with call bell/phone within reach;with bed alarm set Nurse Communication: Mobility status PT Visit Diagnosis: Muscle weakness (generalized) (M62.81);Difficulty in walking, not elsewhere classified (R26.2);Repeated falls (R29.6)     Time: 7124-5809 PT Time Calculation (min) (ACUTE ONLY): 48 min  Charges:  $Therapeutic Exercise: 8-22 mins $Therapeutic  Activity: 23-37 mins                    D. Scott Zaylia Riolo PT, DPT 01/22/22, 3:38 PM

## 2022-01-23 DIAGNOSIS — U071 COVID-19: Secondary | ICD-10-CM | POA: Diagnosis not present

## 2022-01-23 DIAGNOSIS — W19XXXA Unspecified fall, initial encounter: Secondary | ICD-10-CM | POA: Diagnosis not present

## 2022-01-23 DIAGNOSIS — J1282 Pneumonia due to coronavirus disease 2019: Secondary | ICD-10-CM

## 2022-01-23 DIAGNOSIS — R0902 Hypoxemia: Secondary | ICD-10-CM | POA: Diagnosis not present

## 2022-01-23 DIAGNOSIS — J9601 Acute respiratory failure with hypoxia: Secondary | ICD-10-CM | POA: Diagnosis not present

## 2022-01-23 DIAGNOSIS — Z86718 Personal history of other venous thrombosis and embolism: Secondary | ICD-10-CM

## 2022-01-23 LAB — BASIC METABOLIC PANEL
Anion gap: 3 — ABNORMAL LOW (ref 5–15)
BUN: 25 mg/dL — ABNORMAL HIGH (ref 8–23)
CO2: 27 mmol/L (ref 22–32)
Calcium: 8.7 mg/dL — ABNORMAL LOW (ref 8.9–10.3)
Chloride: 108 mmol/L (ref 98–111)
Creatinine, Ser: 0.79 mg/dL (ref 0.44–1.00)
GFR, Estimated: >60 mL/min (ref >60)
Glucose, Bld: 93 mg/dL (ref 70–99)
Potassium: 4.2 mmol/L (ref 3.5–5.1)
Sodium: 138 mmol/L (ref 135–145)

## 2022-01-23 LAB — CBC
HCT: 40.5 % (ref 36.0–46.0)
Hemoglobin: 13.3 g/dL (ref 12.0–15.0)
MCH: 31.2 pg (ref 26.0–34.0)
MCHC: 32.8 g/dL (ref 30.0–36.0)
MCV: 95.1 fL (ref 80.0–100.0)
Platelets: 176 10*3/uL (ref 150–400)
RBC: 4.26 MIL/uL (ref 3.87–5.11)
RDW: 14.3 % (ref 11.5–15.5)
WBC: 8.2 10*3/uL (ref 4.0–10.5)
nRBC: 0 % (ref 0.0–0.2)

## 2022-01-23 NOTE — Progress Notes (Signed)
PROGRESS NOTE    Jane Perez   DUK:[id number] DOB: 05/09/1930  DOA: 01/16/2022 Date of Service: 01/23/22 PCP: Tracie Harrier, MD     Brief Narrative / Hospital Course:  Jane Perez is a 88 y.o. 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 She presented from SNF to the ED on 01/16/2022 with fall. Has frequent falls as demonstrated by multiple healed fractures on admission imaging. 11/08:In the ED, it was found that they had BP 101/59 with otherwise normal vitals. Labs with troponin of 10, BNP 148.  Hemoglobin at baseline at 11.9.  Creatinine near baseline at 1.06.  COVID-positive, urinalysis with moderate leukocyte esterase and many bacteria.  Significant findings included CT head and C-spine T-spine L-spine showing remote compression deformities of T2-T3-T4 and L1 CTA chest negative for PE and showing patchy airspace opacities in the left lower lobe suspicious for pneumonia among other findings including remote rib and clavicle fractures. They were initially treated with paxlovid, steroids and supplemental oxygen. Patient was admitted to medicine service for further workup and management of covid 19  11/14 -stable, improving and weaned to room air. Barrier to discharge is SNF acceptance policy s/p COVID.  Consultants:  none  Procedures: none      ASSESSMENT & PLAN:   Principal Problem:   Fall Active Problems:   Pneumonia due to COVID-19 virus   Hypoxia   Urinary tract infection   History of bladder cancer   History of DVT (deep vein thrombosis)   Pneumonia   Acute respiratory failure with hypoxia (HCC)   Closed nondisplaced fracture of right clavicle   COVID-19   Viral pneumonia   Acute sepsis ([place])   Fall resulting in no significant injuries.  History of frequent falls Multifactorial in  setting of acute illness, hypotension, bradycardia.  - Fall precautions and PT eval/treatment - ortho curbside on clavicle fracture and does not recommend intervention. Likely subacute given patient not complaining of pain   Hypotension- stable - stopped IV maintenance fluids - hold home BP meds   Pneumonia due to COVID-19 virus-  Hypoxic to 89% on presentation, now stable on room air. Procalcitonin normal. Less likely to have secondary bacterial pna.  Blood cultures negative.  - continue Paxlovid, antitussives, albuterol as needed             - paxlovid changed to molnupiravir x5 days due to interaction with eliquis completed yesterday. - Oral dexamethasone completed - Incentive spirometer and supportive care   History of DVT (deep vein thrombosis) Continue Eliquis   History of bladder cancer History of lung cancer Followed by [facility] hospice/palliative care   Urinary tract infection described on admission- she was asymptomatic and urinalysis not consistent with infection. Had Klebsiella UTI 12/09/2021. Received initial dose of rocephin in ED. Urine cx positive for multiple species including proteus mirabilis and enterococcus faecalis. She remains asymptomatic and stable. Will continue to monitor off Abx - follow urine output   Dementia- very pleasant. Mildly confused. Oriented to self and place but not date or circumstances this am - OOB as much as possible    DVT prophylaxis: eliquis  Pertinent IV fluids/nutrition: none Central lines / invasive devices: none  Code Status: FULL CODE  Family Communication: none at this time  Disposition: inpatient, plan d/c SNF TOC needs: none at this time  Barriers to discharge / significant  pending items: SNF protocol for COVID(+) pts precludes d/c              Subjective:  Patient reports feeling ewll today, no concerns, no SOB        Objective:  Vitals:   01/22/22 0943 01/22/22 1747 01/23/22 0548 01/23/22 0850   BP: 133/76 128/60 123/80 123/77  Pulse: 77 72 74 73  Resp: 18 16 18 18   Temp: 98.6 F (37 C) (!) 97.5 F (36.4 C) 97.6 F (36.4 C) 98 F (36.7 C)  TempSrc:   Oral   SpO2: 94% 93% 95% 98%  Weight:      Height:        Intake/Output Summary (Last 24 hours) at 01/23/2022 1452 Last data filed at 01/23/2022 1025 Gross per 24 hour  Intake 240 ml  Output 850 ml  Net -610 ml   Filed Weights   01/17/22 0418  Weight: 72.6 kg    Examination: Constitutional:  VS as above General Appearance: alert, frail, NAD Respiratory: Normal respiratory effort No wheeze No rhonchi No rales Cardiovascular: S1/S2 normal RRR No lower extremity edema Gastrointestinal: No tenderness Musculoskeletal:  No clubbing/cyanosis of digits Symmetrical movement in all extremities Neurological: No cranial nerve deficit on limited exam Alert Psychiatric: Normal mood and affect       Scheduled Medications:   albuterol  2 puff Inhalation Q6H   apixaban  5 mg Oral BID   feeding supplement  237 mL Oral TID BM   gabapentin  100 mg Oral QHS   multivitamin with minerals  1 tablet Oral Daily   pseudoephedrine  120 mg Oral BID   traZODone  50 mg Oral QHS    Continuous Infusions:   PRN Medications:  acetaminophen, chlorpheniramine-HYDROcodone, guaiFENesin-dextromethorphan, ondansetron **OR** ondansetron (ZOFRAN) IV, oxyCODONE  Antimicrobials:  Anti-infectives (From admission, onward)    Start     Dose/Rate Route Frequency Ordered Stop   01/18/22 0200  cefTRIAXone (ROCEPHIN) 1 g in sodium chloride 0.9 % 100 mL IVPB  Status:  Discontinued        1 g 200 mL/hr over 30 Minutes Intravenous Every 24 hours 01/17/22 0215 01/17/22 1227   01/17/22 2200  molnupiravir EUA (LAGEVRIO) capsule 800 mg        4 capsule Oral 2 times daily 01/17/22 1027 01/22/22 0945   01/17/22 0145  nirmatrelvir/ritonavir EUA (renal dosing) (PAXLOVID) 2 tablet  Status:  Discontinued        2 tablet Oral 2 times daily  01/17/22 0142 01/17/22 1027   01/17/22 0130  nirmatrelvir/ritonavir EUA (PAXLOVID) 3 tablet  Status:  Discontinued        3 tablet Oral 2 times daily 01/17/22 0118 01/17/22 0142   01/17/22 0130  cefTRIAXone (ROCEPHIN) 1 g in sodium chloride 0.9 % 100 mL IVPB        1 g 200 mL/hr over 30 Minutes Intravenous  Once 01/17/22 0127 01/17/22 0241       Data Reviewed: I have personally reviewed following labs and imaging studies  CBC: Recent Labs  Lab 01/16/22 2358 01/18/22 0651 01/19/22 0527 01/23/22 0453  WBC 7.9 4.9 8.2 8.2  NEUTROABS 5.6  --   --   --   HGB 11.9* 12.2 11.5* 13.3  HCT 36.5 38.3 35.8* 40.5  MCV 95.8 97.0 96.2 95.1  PLT 210 192 198 852   Basic Metabolic Panel: Recent Labs  Lab 01/16/22 2358 01/18/22 0651 01/19/22 0527 01/23/22 0453  NA 141 142 140 138  K 3.5  3.7 4.4 4.2  CL 105 110 108 108  CO2 28 30 29 27   GLUCOSE 92 83 132* 93  BUN 26* 16 17 25*  CREATININE 1.06* 0.87 0.81 0.79  CALCIUM 9.1 8.7* 9.0 8.7*   GFR: Estimated Creatinine Clearance: 45.5 mL/min (by C-G formula based on SCr of 0.79 mg/dL). Liver Function Tests: Recent Labs  Lab 01/16/22 2358 01/18/22 0651  AST 29 25  ALT 19 18  ALKPHOS 88 86  BILITOT 1.3* 0.9  PROT 6.1* 5.6*  ALBUMIN 2.6* 2.2*   No results for input(s): "LIPASE", "AMYLASE" in the last 168 hours. No results for input(s): "AMMONIA" in the last 168 hours. Coagulation Profile: No results for input(s): "INR", "PROTIME" in the last 168 hours. Cardiac Enzymes: No results for input(s): "CKTOTAL", "CKMB", "CKMBINDEX", "TROPONINI" in the last 168 hours. BNP (last 3 results) No results for input(s): "PROBNP" in the last 8760 hours. HbA1C: No results for input(s): "HGBA1C" in the last 72 hours. CBG: No results for input(s): "GLUCAP" in the last 168 hours. Lipid Profile: No results for input(s): "CHOL", "HDL", "LDLCALC", "TRIG", "CHOLHDL", "LDLDIRECT" in the last 72 hours. Thyroid Function Tests: No results for input(s):  "TSH", "T4TOTAL", "FREET4", "T3FREE", "THYROIDAB" in the last 72 hours. Anemia Panel: No results for input(s): "VITAMINB12", "FOLATE", "FERRITIN", "TIBC", "IRON", "RETICCTPCT" in the last 72 hours. Urine analysis:    Component Value Date/Time   COLORURINE AMBER (A) 01/16/2022 2355   APPEARANCEUR CLOUDY (A) 01/16/2022 2355   APPEARANCEUR Clear 04/11/2015 1339   LABSPEC 1.011 01/16/2022 2355   PHURINE 8.0 01/16/2022 2355   GLUCOSEU NEGATIVE 01/16/2022 2355   HGBUR NEGATIVE 01/16/2022 2355   BILIRUBINUR NEGATIVE 01/16/2022 2355   BILIRUBINUR Negative 04/11/2015 1339   KETONESUR NEGATIVE 01/16/2022 2355   PROTEINUR 30 (A) 01/16/2022 2355   NITRITE NEGATIVE 01/16/2022 2355   LEUKOCYTESUR MODERATE (A) 01/16/2022 2355   Sepsis Labs: @LABRCNTIP (procalcitonin:4,lacticidven:4)  Recent Results (from the past 240 hour(s))  Resp Panel by RT-PCR (Flu A&B, Covid) Anterior Nasal Swab     Status: Abnormal   Collection Time: 01/16/22 10:33 PM   Specimen: Anterior Nasal Swab  Result Value Ref Range Status   SARS Coronavirus 2 by RT PCR POSITIVE (A) NEGATIVE Final    Comment: (NOTE) SARS-CoV-2 target nucleic acids are DETECTED.  The SARS-CoV-2 RNA is generally detectable in upper respiratory specimens during the acute phase of infection. Positive results are indicative of the presence of the identified virus, but do not rule out bacterial infection or co-infection with other pathogens not detected by the test. Clinical correlation with patient history and other diagnostic information is necessary to determine patient infection status. The expected result is Negative.  Fact Sheet for Patients: EntrepreneurPulse.com.au  Fact Sheet for Healthcare Providers: IncredibleEmployment.be  This test is not yet approved or cleared by the [place] FDA and  has been authorized for detection and/or diagnosis of SARS-CoV-2 by FDA under an Emergency Use  Authorization (EUA).  This EUA will remain in effect (meaning this test can be used) for the duration of  the COVID-19 declaration under Section 564(b)(1) of the A ct, 21 U.S.C. section 360bbb-3(b)(1), unless the authorization is terminated or revoked sooner.     Influenza A by PCR NEGATIVE NEGATIVE Final   Influenza B by PCR NEGATIVE NEGATIVE Final    Comment: (NOTE) The Xpert Xpress SARS-CoV-2/FLU/RSV plus assay is intended as an aid in the diagnosis of influenza from Nasopharyngeal swab specimens and should not be used as a sole basis for  treatment. Nasal washings and aspirates are unacceptable for Xpert Xpress SARS-CoV-2/FLU/RSV testing.  Fact Sheet for Patients: EntrepreneurPulse.com.au  Fact Sheet for Healthcare Providers: IncredibleEmployment.be  This test is not yet approved or cleared by the [place] FDA and has been authorized for detection and/or diagnosis of SARS-CoV-2 by FDA under an Emergency Use Authorization (EUA). This EUA will remain in effect (meaning this test can be used) for the duration of the COVID-19 declaration under Section 564(b)(1) of the Act, 21 U.S.C. section 360bbb-3(b)(1), unless the authorization is terminated or revoked.  Performed at [facility], [address] [place], [place] [address]   Urine Culture     Status: Abnormal   Collection Time: 01/16/22 11:55 PM   Specimen: Urine, Clean Catch  Result Value Ref Range Status   Specimen Description   Final    URINE, CLEAN CATCH Performed at [facility], [address] [place], [place] [address]    Special Requests   Final    NONE Performed at [facility], [address] [place], [place] [address]    Culture (A)  Final    >=100,000 COLONIES/mL PROTEUS MIRABILIS 80,000 COLONIES/mL ENTEROCOCCUS FAECALIS    Report Status 01/19/2022 FINAL  Final   Organism ID, Bacteria PROTEUS MIRABILIS (A)  Final   Organism ID,  Bacteria ENTEROCOCCUS FAECALIS (A)  Final      Susceptibility   Enterococcus faecalis - MIC*    AMPICILLIN <=2 SENSITIVE Sensitive     NITROFURANTOIN <=16 SENSITIVE Sensitive     VANCOMYCIN 1 SENSITIVE Sensitive     * 80,000 COLONIES/mL ENTEROCOCCUS FAECALIS   Proteus mirabilis - MIC*    AMPICILLIN <=2 SENSITIVE Sensitive     CEFAZOLIN <=4 SENSITIVE Sensitive     CEFEPIME <=0.12 SENSITIVE Sensitive     CEFTRIAXONE <=0.25 SENSITIVE Sensitive     CIPROFLOXACIN <=0.25 SENSITIVE Sensitive     GENTAMICIN <=1 SENSITIVE Sensitive     IMIPENEM 1 SENSITIVE Sensitive     NITROFURANTOIN 256 RESISTANT Resistant     TRIMETH/SULFA <=20 SENSITIVE Sensitive     AMPICILLIN/SULBACTAM <=2 SENSITIVE Sensitive     PIP/TAZO <=4 SENSITIVE Sensitive     * >=100,000 COLONIES/mL PROTEUS MIRABILIS  Blood culture (routine x 2)     Status: None   Collection Time: 01/17/22  3:38 AM   Specimen: BLOOD  Result Value Ref Range Status   Specimen Description BLOOD LEFT HAND  Final   Special Requests   Final    BOTTLES DRAWN AEROBIC AND ANAEROBIC Blood Culture results may not be optimal due to an inadequate volume of blood received in culture bottles   Culture   Final    NO GROWTH 5 DAYS Performed at [facility], [address], [place], [place] [address]    Report Status 01/22/2022 FINAL  Final  Blood culture (routine x 2)     Status: None   Collection Time: 01/17/22  3:39 AM   Specimen: BLOOD  Result Value Ref Range Status   Specimen Description BLOOD RIGHT [facility]  Final   Special Requests   Final    BOTTLES DRAWN AEROBIC AND ANAEROBIC Blood Culture adequate volume   Culture   Final    NO GROWTH 5 DAYS Performed at [facility], [address], [place], [place] [address]    Report Status 01/22/2022 FINAL  Final         Radiology Studies: CT T-SPINE NO CHARGE  Result Date: 01/17/2022 CLINICAL DATA:  Frequent falls, found  down EXAM: CT THORACIC AND LUMBAR SPINE WITHOUT CONTRAST  TECHNIQUE: Multidetector CT imaging of the thoracic and lumbar spine was performed without contrast. Multiplanar CT image reconstructions were also generated. RADIATION DOSE REDUCTION: This exam was performed according to the departmental dose-optimization program which includes automated exposure control, adjustment of the mA and/or kV according to patient size and/or use of iterative reconstruction technique. COMPARISON:  09/02/2021 CT T and L-spine; correlation is also made with CT cervical spine 12/27/2021 FINDINGS: CT THORACIC SPINE FINDINGS Alignment: No traumatic listhesis. Exaggeration of the normal thoracic kyphosis. Scoliosis. Vertebrae: Osteopenia. Unchanged remote compression deformities T3 and T4. 10% vertebral body height loss and increased density of the superior aspect of T2, which appears unchanged compared to the 12/27/2021 CT cervical spine. Unchanged remote T2 spinous process fracture (series 9, image 56) no definite acute fracture. Paraspinal and other soft tissues: Please see same-day CT chest Disc levels: Multilevel degenerative disc disease without significant spinal canal stenosis. CT LUMBAR SPINE FINDINGS Segmentation: 5 lumbar-type vertebral bodies. Alignment: Exaggeration of the normal lumbar lordosis. Lumbar levoscoliosis. 3 mm anterolisthesis of T12 on L1. 3 mm retrolisthesis of L1 on L2. 3 mm anterolisthesis of L3 on L4, L4 on L5, and L5 on S1. Vertebrae: No acute fracture or suspicious osseous lesion. Redemonstrated remote L1 compression fracture. Vertebral body heights are otherwise preserved. Paraspinal and other soft tissues: Aortic atherosclerosis. Disc levels: T12-L1: Disc height loss and grade 1 anterolisthesis. No significant spinal canal stenosis or neural foraminal narrowing. L1-L2: Disc height loss and moderate disc bulge with annular calcification. Mild facet arthropathy. Moderate spinal canal stenosis and severe right and moderate left neural foraminal narrowing. L2-L3:  Mild disc bulge. Mild facet arthropathy. Ligamentum flavum hypertrophy. Moderate spinal canal stenosis. No neural foraminal narrowing. L3-L4: Trace anterolisthesis disc unroofing and moderate disc bulge. Severe facet arthropathy. Ligamentum flavum hypertrophy. Severe spinal canal stenosis. Severe left neural foraminal narrowing. L4-L5: Anterolisthesis and disc unroofing with moderate right eccentric disc bulge. Severe facet arthropathy. Ligamentum flavum hypertrophy. Mild spinal canal stenosis. No neural foraminal narrowing. L5-S1: Anterolisthesis with disc unroofing. Severe facet arthropathy. Severe spinal canal stenosis. Moderate left neural foraminal narrowing. IMPRESSION: 1. No acute fracture or traumatic listhesis in the thoracic or lumbar spine. 2. Unchanged remote compression deformities of T2, T3, T4, and L1. 3. Multilevel degenerative disc disease and spondylolisthesis, as described above. 4. Aortic atherosclerosis. Aortic Atherosclerosis (ICD10-I70.0). Electronically Signed   By: Merilyn Baba M.D.   On: 01/17/2022 01:56   CT Lumbar Spine Wo Contrast  Result Date: 01/17/2022 CLINICAL DATA:  Frequent falls, found down EXAM: CT THORACIC AND LUMBAR SPINE WITHOUT CONTRAST TECHNIQUE: Multidetector CT imaging of the thoracic and lumbar spine was performed without contrast. Multiplanar CT image reconstructions were also generated. RADIATION DOSE REDUCTION: This exam was performed according to the departmental dose-optimization program which includes automated exposure control, adjustment of the mA and/or kV according to patient size and/or use of iterative reconstruction technique. COMPARISON:  09/02/2021 CT T and L-spine; correlation is also made with CT cervical spine 12/27/2021 FINDINGS: CT THORACIC SPINE FINDINGS Alignment: No traumatic listhesis. Exaggeration of the normal thoracic kyphosis. Scoliosis. Vertebrae: Osteopenia. Unchanged remote compression deformities T3 and T4. 10% vertebral body height  loss and increased density of the superior aspect of T2, which appears unchanged compared to the 12/27/2021 CT cervical spine. Unchanged remote T2 spinous process fracture (series 9, image 56) no definite acute fracture. Paraspinal and other soft tissues: Please see same-day CT chest Disc levels: Multilevel degenerative disc disease  without significant spinal canal stenosis. CT LUMBAR SPINE FINDINGS Segmentation: 5 lumbar-type vertebral bodies. Alignment: Exaggeration of the normal lumbar lordosis. Lumbar levoscoliosis. 3 mm anterolisthesis of T12 on L1. 3 mm retrolisthesis of L1 on L2. 3 mm anterolisthesis of L3 on L4, L4 on L5, and L5 on S1. Vertebrae: No acute fracture or suspicious osseous lesion. Redemonstrated remote L1 compression fracture. Vertebral body heights are otherwise preserved. Paraspinal and other soft tissues: Aortic atherosclerosis. Disc levels: T12-L1: Disc height loss and grade 1 anterolisthesis. No significant spinal canal stenosis or neural foraminal narrowing. L1-L2: Disc height loss and moderate disc bulge with annular calcification. Mild facet arthropathy. Moderate spinal canal stenosis and severe right and moderate left neural foraminal narrowing. L2-L3: Mild disc bulge. Mild facet arthropathy. Ligamentum flavum hypertrophy. Moderate spinal canal stenosis. No neural foraminal narrowing. L3-L4: Trace anterolisthesis disc unroofing and moderate disc bulge. Severe facet arthropathy. Ligamentum flavum hypertrophy. Severe spinal canal stenosis. Severe left neural foraminal narrowing. L4-L5: Anterolisthesis and disc unroofing with moderate right eccentric disc bulge. Severe facet arthropathy. Ligamentum flavum hypertrophy. Mild spinal canal stenosis. No neural foraminal narrowing. L5-S1: Anterolisthesis with disc unroofing. Severe facet arthropathy. Severe spinal canal stenosis. Moderate left neural foraminal narrowing. IMPRESSION: 1. No acute fracture or traumatic listhesis in the thoracic or  lumbar spine. 2. Unchanged remote compression deformities of T2, T3, T4, and L1. 3. Multilevel degenerative disc disease and spondylolisthesis, as described above. 4. Aortic atherosclerosis. Aortic Atherosclerosis (ICD10-I70.0). Electronically Signed   By: Merilyn Baba M.D.   On: 01/17/2022 01:56   CT Angio Chest PE W and/or Wo Contrast  Result Date: 01/17/2022 CLINICAL DATA:  88 year old female with history of DVT and PE on anticoagulation; found down at her facility EXAM: CT ANGIOGRAPHY CHEST WITH CONTRAST TECHNIQUE: Multidetector CT imaging of the chest was performed using the standard protocol during bolus administration of intravenous contrast. Multiplanar CT image reconstructions and MIPs were obtained to evaluate the vascular anatomy. RADIATION DOSE REDUCTION: This exam was performed according to the departmental dose-optimization program which includes automated exposure control, adjustment of the mA and/or kV according to patient size and/or use of iterative reconstruction technique. CONTRAST:  4mL OMNIPAQUE IOHEXOL 350 MG/ML SOLN COMPARISON:  Radiographs 01/16/2022 and CT chest 11/11/2012. FINDINGS: Cardiovascular: Satisfactory opacification of the pulmonary arteries to the segmental level. No evidence of pulmonary embolism. Cardiomegaly. No pericardial effusion. Coronary artery and aortic atherosclerotic calcification. Mediastinum/Nodes: No enlarged mediastinal, hilar, or axillary lymph nodes. Thyroid gland, trachea, and esophagus demonstrate no significant findings. Lungs/Pleura: Respiratory motion obscures fine detail. Mild diffuse interlobular septal thickening. Bronchial wall thickening and mucous plugging in the left-greater-than-right lower lobes. Atelectasis or pneumonia in the left lower lobe and lingula. Upper Abdomen: No acute abnormality. Bilateral adrenal thickening which is slightly nodular on the left slightly increased from 2014 but likely representing benign hyperplasia/adenoma. No  follow-up is recommended. Cholecystectomy. Musculoskeletal: Age indeterminate fracture of the proximal right clavicle which is minimally displaced as a acute to subacute appearance. Numerous subacute and chronic bilateral rib fractures. Marked compression fracture of T4. See separate report from CT of the thoracic spine for details. Review of the MIP images confirms the above findings. IMPRESSION: 1. Negative for acute pulmonary embolism. 2. Patchy airspace opacities bilaterally greatest in the left lower lobe suspicious for pneumonia. 3. Cardiomegaly and interstitial thickening suggestive of pulmonary edema. 4. Acute or subacute proximal right clavicle fracture. 5. Subacute and chronic bilateral rib fractures. 6. Age indeterminate compression fracture of T4. See separate report for findings in the  thoracolumbar spine. Electronically Signed   By: Placido Sou M.D.   On: 01/17/2022 01:52   CT HEAD WO CONTRAST (5MM)  Result Date: 01/16/2022 CLINICAL DATA:  Head and neck trauma after fall on anticoagulation; bruising to the right forehead EXAM: CT HEAD WITHOUT CONTRAST TECHNIQUE: Contiguous axial images were obtained from the base of the skull through the vertex without intravenous contrast. RADIATION DOSE REDUCTION: This exam was performed according to the departmental dose-optimization program which includes automated exposure control, adjustment of the mA and/or kV according to patient size and/or use of iterative reconstruction technique. COMPARISON:  CT head and C-spine 12/27/2021 FINDINGS: CT HEAD FINDINGS Brain: No intracranial hemorrhage, mass effect, or evidence of acute infarct. No hydrocephalus. No extra-axial fluid collection. Generalized cerebral atrophy. Ill-defined hypoattenuation within the cerebral white matter is nonspecific but consistent with chronic small vessel ischemic disease. Vascular: No hyperdense vessel. Intracranial arterial calcification. Skull: No fracture or focal lesion. Scalp  hematoma over the right forehead. Sinuses/Orbits: Mucosal thickening with air-fluid levels in both maxillary sinuses. Mucosal thickening in the ethmoid air cells. Other: None. CT CERVICAL SPINE FINDINGS Alignment: Multiple low-grade vertebral body subluxations are similar to prior. Skull base and vertebrae: No acute fracture. Redemonstrated subacute superior endplate compression fractures and sclerosis of T2 and partially visualized T3. Minimally displaced fracture through the T2 spinous process with signs of sclerosis about the fracture line is also unchanged. Soft tissues and spinal canal: No prevertebral fluid or swelling. No visible canal hematoma. Disc levels: Unchanged multilevel advanced spondylosis and facet arthropathy. Posterior disc osteophyte complexes cause multilevel moderate spinal canal narrowing. Uncovertebral spurring and facet arthropathy cause advanced neural foraminal narrowing bilaterally at C4-C5. Upper chest: Negative. Other: None. IMPRESSION: 1. No acute intracranial abnormality. Generalized atrophy and small vessel white matter disease. 2. No acute fracture in the cervical spine. Multilevel degenerative spondylosis. 3. Subacute compression fractures of T2 and T3 and mildly displaced subacute fracture of the T2 spinous process. 4. Right forehead scalp hematoma.  No calvarial fracture. 5. Mucosal thickening in the ethmoid and bilateral maxillary sinuses with air-fluid levels can be seen with acute sinusitis. Electronically Signed   By: Placido Sou M.D.   On: 01/16/2022 23:47   CT Cervical Spine Wo Contrast  Result Date: 01/16/2022 CLINICAL DATA:  Head and neck trauma after fall on anticoagulation; bruising to the right forehead EXAM: CT HEAD WITHOUT CONTRAST TECHNIQUE: Contiguous axial images were obtained from the base of the skull through the vertex without intravenous contrast. RADIATION DOSE REDUCTION: This exam was performed according to the departmental dose-optimization program  which includes automated exposure control, adjustment of the mA and/or kV according to patient size and/or use of iterative reconstruction technique. COMPARISON:  CT head and C-spine 12/27/2021 FINDINGS: CT HEAD FINDINGS Brain: No intracranial hemorrhage, mass effect, or evidence of acute infarct. No hydrocephalus. No extra-axial fluid collection. Generalized cerebral atrophy. Ill-defined hypoattenuation within the cerebral white matter is nonspecific but consistent with chronic small vessel ischemic disease. Vascular: No hyperdense vessel. Intracranial arterial calcification. Skull: No fracture or focal lesion. Scalp hematoma over the right forehead. Sinuses/Orbits: Mucosal thickening with air-fluid levels in both maxillary sinuses. Mucosal thickening in the ethmoid air cells. Other: None. CT CERVICAL SPINE FINDINGS Alignment: Multiple low-grade vertebral body subluxations are similar to prior. Skull base and vertebrae: No acute fracture. Redemonstrated subacute superior endplate compression fractures and sclerosis of T2 and partially visualized T3. Minimally displaced fracture through the T2 spinous process with signs of sclerosis about the fracture line is  also unchanged. Soft tissues and spinal canal: No prevertebral fluid or swelling. No visible canal hematoma. Disc levels: Unchanged multilevel advanced spondylosis and facet arthropathy. Posterior disc osteophyte complexes cause multilevel moderate spinal canal narrowing. Uncovertebral spurring and facet arthropathy cause advanced neural foraminal narrowing bilaterally at C4-C5. Upper chest: Negative. Other: None. IMPRESSION: 1. No acute intracranial abnormality. Generalized atrophy and small vessel white matter disease. 2. No acute fracture in the cervical spine. Multilevel degenerative spondylosis. 3. Subacute compression fractures of T2 and T3 and mildly displaced subacute fracture of the T2 spinous process. 4. Right forehead scalp hematoma.  No calvarial  fracture. 5. Mucosal thickening in the ethmoid and bilateral maxillary sinuses with air-fluid levels can be seen with acute sinusitis. Electronically Signed   By: Placido Sou M.D.   On: 01/16/2022 23:47   DG Chest 1 View  Result Date: 01/16/2022 CLINICAL DATA:  Chest pain EXAM: CHEST  1 VIEW COMPARISON:  12/09/2021, CT 11/11/2012 FINDINGS: Mild cardiomegaly. Mild diffuse reticular interstitial opacity. Probable subsegmental atelectasis at the bases. No pneumothorax. Aortic atherosclerosis. Old right clavicle fracture. IMPRESSION: 1. Mild diffuse reticular interstitial opacity suggestive of underlying chronic disease. There is probable subsegmental atelectasis at the bases 2. Cardiomegaly Electronically Signed   By: Donavan Foil M.D.   On: 01/16/2022 23:41            LOS: 6 days    Time spent: [place], DO [facility] 01/23/2022, 2:52 PM   Staff may message me via secure chat in [place]  but this may not receive immediate response,  please page for urgent matters!  If 7PM-7AM, please contact night-coverage [email or web address]  Dictation software was used to generate the above note. Typos may occur and escape review, as with typed/written notes. Please contact Dr Sheppard Coil directly for clarity if needed.

## 2022-01-23 NOTE — TOC Progression Note (Signed)
Transition of Care [facility]) - Progression Note    Patient Details  Name: Jane Perez MRN: [id number] Date of Birth: 26-Jun-1930  Transition of Care [facility]) CM/SW [place], RN Phone Number: 01/23/2022, 9:32 AM  Clinical Narrative:   The patient will have completed the 10 day Covid quarantine on 11/19, She resides at the [facility]F, she will need STR SNF, Bedsearch sent, FL2 completed, PASSR obtained         Expected Discharge Plan and Services                                                 Social Determinants of Health (SDOH) Interventions    Readmission Risk Interventions     No data to display

## 2022-01-23 NOTE — NC FL2 (Signed)
[place] LEVEL OF CARE SCREENING TOOL     IDENTIFICATION  Patient Name: Jane Perez Birthdate: 1930-03-25 Sex: female Admission Date (Current Location): 01/16/2022  [facility] and [place] Number:  Engineering geologist and Address:  [facility], [address], [place], [place] [address]      Provider Number: [id number]  Attending Physician Name and Address:  Emeterio Reeve, DO  Relative Name and Phone Number:  DOnald SOn [phone]    Current Level of Care: Hospital Recommended Level of Care: [place] Prior Approval Number:    Date Approved/Denied:   PASRR Number: [id number]  Discharge Plan: SNF    Current Diagnoses: Patient Active Problem List   Diagnosis Date Noted   Acute sepsis ([place]) 01/22/2022   Viral pneumonia 01/20/2022   Acute respiratory failure with hypoxia ([place]) 01/18/2022   Closed nondisplaced fracture of right clavicle 01/18/2022   COVID-19 01/18/2022   Fall 01/17/2022   Hypoxia 01/17/2022   Pneumonia due to COVID-19 virus 01/17/2022   History of DVT (deep vein thrombosis) 01/17/2022   Bladder cancer ([place]) 01/17/2022   Pneumonia 01/17/2022   Frequent falls 09/02/2021   Fracture of lumbar spine ([place]) 09/02/2021   Peripheral edema, weakness, concerning for CHF (congestive heart failure)  09/02/2021   History of bladder cancer 04/12/2015   Urinary tract infection 01/09/2015   Atrophic vaginitis 01/09/2015    Orientation RESPIRATION BLADDER Height & Weight     Self, Time, Situation, Place  Normal Continent, External catheter Weight: 72.6 kg Height:  5\' 3"  (160 cm)  BEHAVIORAL SYMPTOMS/MOOD NEUROLOGICAL BOWEL NUTRITION STATUS      Continent Diet (See DC summary)  AMBULATORY STATUS COMMUNICATION OF NEEDS Skin   Extensive Assist Verbally Normal                       Personal Care Assistance Level of Assistance  Bathing, Feeding, Dressing Bathing Assistance: Maximum  assistance Feeding assistance: Limited assistance Dressing Assistance: Maximum assistance     Functional Limitations Info             SPECIAL CARE FACTORS FREQUENCY  PT (By licensed PT), OT (By licensed OT)     PT Frequency: 5 times per week OT Frequency: 5 times per week            Contractures Contractures Info: Not present    Additional Factors Info  Code Status, Allergies Code Status Info: FUll code Allergies Info: Aspirin, Ciprofloxacin, Codeine, Metronidazole, Penicillins, Sulfa Antibiotics, Clindamycin Hcl, Nitrofurantoin Monohyd Macro, Other, Povidone Iodine           Current Medications (01/23/2022):  This is the current hospital active medication list Current Facility-Administered Medications  Medication Dose Route Frequency Provider Last Rate Last Admin   acetaminophen (TYLENOL) tablet 650 mg  650 mg Oral Q6H PRN Athena Masse, MD       albuterol (VENTOLIN HFA) 108 (90 Base) MCG/ACT inhaler 2 puff  2 puff Inhalation Q6H Athena Masse, MD   2 puff at 01/23/22 0811   apixaban (ELIQUIS) tablet 5 mg  5 mg Oral BID Athena Masse, MD   5 mg at 01/23/22 0811   chlorpheniramine-HYDROcodone (TUSSIONEX) 10-8 MG/5ML suspension 5 mL  5 mL Oral Q12H PRN Athena Masse, MD       feeding supplement (ENSURE ENLIVE / ENSURE PLUS) liquid 237 mL  237 mL Oral TID BM Richarda Osmond, MD   237 mL at 01/23/22 [phone]  gabapentin (NEURONTIN) capsule 100 mg  100 mg Oral QHS Judd Gaudier V, MD   100 mg at 01/22/22 2147   guaiFENesin-dextromethorphan (ROBITUSSIN DM) 100-10 MG/5ML syrup 10 mL  10 mL Oral Q4H PRN Athena Masse, MD   10 mL at 01/20/22 1132   multivitamin with minerals tablet 1 tablet  1 tablet Oral Daily Richarda Osmond, MD   1 tablet at 01/23/22 0811   ondansetron (ZOFRAN) tablet 4 mg  4 mg Oral Q6H PRN Athena Masse, MD       Or   ondansetron [facility]) injection 4 mg  4 mg Intravenous Q6H PRN Athena Masse, MD       oxyCODONE (Oxy IR/ROXICODONE)  immediate release tablet 5 mg  5 mg Oral Q4H PRN Athena Masse, MD       pseudoephedrine (SUDAFED) 12 hr tablet 120 mg  120 mg Oral BID Richarda Osmond, MD   120 mg at 01/23/22 0315   traZODone (DESYREL) tablet 50 mg  50 mg Oral QHS Athena Masse, MD   50 mg at 01/22/22 2147     Discharge Medications: Please see discharge summary for a list of discharge medications.  Relevant Imaging Results:  Relevant Lab Results:   Additional Information Patient's SS# [id number]  Conception Oms, RN

## 2022-01-23 NOTE — Progress Notes (Signed)
Patient is alert and oriented x3. She denied pain. Patient voiced that she does not want to go home but wants to stay at th hospital to help people. Slept most of the night. Has a cough that sounds productive but has not expelled anything from cough last night.

## 2022-01-23 NOTE — Hospital Course (Addendum)
Jane Perez is a 88 y.o. 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 She presented from SNF to the ED on 01/16/2022 with fall. Has frequent falls as demonstrated by multiple healed fractures on admission imaging. 11/08:In the ED, it was found that they had BP 101/59 with otherwise normal vitals. Labs with troponin of 10, BNP 148.  Hemoglobin at baseline at 11.9.  Creatinine near baseline at 1.06.  COVID-positive, urinalysis with moderate leukocyte esterase and many bacteria.  Significant findings included CT head and C-spine T-spine L-spine showing remote compression deformities of T2-T3-T4 and L1 CTA chest negative for PE and showing patchy airspace opacities in the left lower lobe suspicious for pneumonia among other findings including remote rib and clavicle fractures. They were initially treated with paxlovid, steroids and supplemental oxygen. Patient was admitted to medicine service for further workup and management of covid 19  11/14 - 11/18: stable, improving and weaned to room air. Barrier to discharge is SNF acceptance policy s/p COVID. Will not be able to discharge until 11/19 at earliest, will need bed choice as well as Ins approval  11/19 today is Sunday, will ask TOC to follow tomorrow 11/20 11/20: still pending ned offers.   Consultants:  none  Procedures: none      ASSESSMENT & PLAN:   Principal Problem:   Fall Active Problems:   Pneumonia due to COVID-19 virus   Hypoxia   Urinary tract infection   History of bladder cancer   History of DVT (deep vein thrombosis)   Pneumonia   Acute respiratory failure with hypoxia (HCC)   Closed nondisplaced fracture of right clavicle   COVID-19   Viral pneumonia   Acute sepsis ([place])   Fall resulting in no significant injuries.  History of frequent  falls Multifactorial in setting of acute illness, hypotension, bradycardia.  - Fall precautions and PT eval/treatment --> SNF placement pending  - ortho curbside on clavicle fracture and does not recommend intervention. Likely subacute given patient not complaining of pain   Hypotension- stable - stopped IV maintenance fluids - hold home BP meds   Pneumonia due to COVID-19 virus- improving/resolved  Hypoxic to 89% on presentation, now stable on room air. Procalcitonin normal. Less likely to have secondary bacterial pna.  Blood cultures negative.  - antivirals completed. - Oral dexamethasone completed - Incentive spirometer and supportive care    History of DVT (deep vein thrombosis) - Continue Eliquis   History of bladder cancer History of lung cancer - Followed by [facility] hospice/palliative care   Urinary tract infection described on admission- she was asymptomatic and urinalysis not consistent with infection. Had Klebsiella UTI 12/09/2021. Received initial dose of rocephin in ED. Urine cx positive for multiple species including proteus mirabilis and enterococcus faecalis. She remains asymptomatic and stable. Will continue to monitor off Abx - follow urine output   Dementia- very pleasant. Mildly confused. Oriented to self and place - OOB as much as possible     DVT prophylaxis: eliquis  Pertinent IV fluids/nutrition: none Central lines / invasive devices: none  Code Status: FULL CODE  Family Communication: none at this time  Disposition: inpatient, plan d/c SNF TOC needs: none at this time will check back tomorrow which is Monday 01/28/22 Barriers to discharge / significant pending items: SNF protocol for COVID(+) pts precludes d/c

## 2022-01-23 NOTE — Plan of Care (Signed)

## 2022-01-23 NOTE — Progress Notes (Signed)
Occupational Therapy Treatment Patient Details Name: Jane Perez MRN: [id number] DOB: [birth year] Today's Date: 01/23/2022   History of present illness Jane Perez is an 88 y.o. 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 Admitted  on 01/16/2022 with fall and found to be COVID + with PNA. Has frequent falls as demonstrated by multiple healed fractures on admission imaging including remote T2-4 and L1 compression fractures.   OT comments  Ms Ventresca was seen for OT treatment on this date. Upon arrival to room pt reclined in bed eating breakfast, agreeable to tx. Pt requires MOD A exit bed, states fear of falling and appears anxious in sitting. Initial MAX A sitting balance improves to CGA with time and distraction. MIN A self-feeding seated EOB, assist to locate items and for intermittent sitting balance. Pt making progress toward goals, will continue to follow POC. Discharge recommendation remains appropriate.     Recommendations for follow up therapy are one component of a multi-disciplinary discharge planning process, led by the attending physician.  Recommendations may be updated based on patient status, additional functional criteria and insurance authorization.    Follow Up Recommendations  Skilled nursing-short term rehab (<3 hours/day)     Assistance Recommended at Discharge Frequent or constant Supervision/Assistance  Patient can return home with the following  Two people to help with walking and/or transfers;Two people to help with bathing/dressing/bathroom   Equipment Recommendations  Other (comment) (defer)    Recommendations for Other Services      Precautions / Restrictions Precautions Precautions: Fall Restrictions Weight Bearing Restrictions: No       Mobility Bed Mobility Overal bed  mobility: Needs Assistance Bed Mobility: Supine to Sit, Sit to Supine     Supine to sit: Mod assist Sit to supine: Mod assist        Transfers                   General transfer comment: unsafe to attempt, pt very scared     Balance Overall balance assessment: History of Falls, Needs assistance Sitting-balance support: Single extremity supported, Feet supported Sitting balance-Leahy Scale: Fair Sitting balance - Comments: Frequent posterior lean                                   ADL either performed or assessed with clinical judgement   ADL Overall ADL's : Needs assistance/impaired                                       General ADL Comments: MIN A self-feeding seated EOB, assist to locate items and for initial sitting balance.      Cognition Arousal/Alertness: Awake/alert Behavior During Therapy: WFL for tasks assessed/performed Overall Cognitive Status: No family/caregiver present to determine baseline cognitive functioning                                 General Comments: Pt requesting breakfast after she completed eating it. States month as Feb                   Pertinent Vitals/ Pain  Pain Assessment Pain Assessment: No/denies pain   Frequency  Min 2X/week        Progress Toward Goals  OT Goals(current goals can now be found in the care plan section)  Progress towards OT goals: Progressing toward goals  Acute Rehab OT Goals Patient Stated Goal: to return home OT Goal Formulation: With patient Time For Goal Achievement: 02/04/22 Potential to Achieve Goals: Fair ADL Goals Pt Will Perform Grooming: with set-up;with supervision;sitting Pt Will Perform Lower Body Dressing: with min assist;sitting/lateral leans Pt Will Transfer to Toilet: with mod assist;squat pivot transfer;bedside commode  Plan Discharge plan remains appropriate;Frequency remains appropriate    Co-evaluation                  AM-PAC OT "6 Clicks" Daily Activity     Outcome Measure   Help from another person eating meals?: A Little Help from another person taking care of personal grooming?: A Little Help from another person toileting, which includes using toliet, bedpan, or urinal?: A Lot Help from another person bathing (including washing, rinsing, drying)?: A Lot Help from another person to put on and taking off regular upper body clothing?: A Little Help from another person to put on and taking off regular lower body clothing?: A Lot 6 Click Score: 15    End of Session    OT Visit Diagnosis: Other abnormalities of gait and mobility (R26.89);Muscle weakness (generalized) (M62.81)   Activity Tolerance Patient tolerated treatment well   Patient Left in bed;with call bell/phone within reach;with bed alarm set   Nurse Communication          Time: [phone] OT Time Calculation (min): 24 min  Charges: OT General Charges $OT Visit: 1 Visit OT Treatments $Self Care/Home Management : 23-37 mins  Dessie Coma, M.S. OTR/L  01/23/22, 10:32 AM  ascom [phone]

## 2022-01-24 DIAGNOSIS — U071 COVID-19: Secondary | ICD-10-CM | POA: Diagnosis not present

## 2022-01-24 DIAGNOSIS — S42001A Fracture of unspecified part of right clavicle, initial encounter for closed fracture: Secondary | ICD-10-CM | POA: Diagnosis not present

## 2022-01-24 DIAGNOSIS — J189 Pneumonia, unspecified organism: Secondary | ICD-10-CM

## 2022-01-24 DIAGNOSIS — W19XXXA Unspecified fall, initial encounter: Secondary | ICD-10-CM | POA: Diagnosis not present

## 2022-01-24 DIAGNOSIS — Z8551 Personal history of malignant neoplasm of bladder: Secondary | ICD-10-CM

## 2022-01-24 DIAGNOSIS — N3000 Acute cystitis without hematuria: Secondary | ICD-10-CM

## 2022-01-24 MED ORDER — POLYETHYLENE GLYCOL 3350 17 G PO PACK
17.0000 g | PACK | Freq: Every day | ORAL | Status: DC
  Administered 2022-01-24 – 2022-01-29 (×6): 17 g via ORAL
  Filled 2022-01-24 (×6): qty 1

## 2022-01-24 MED ORDER — BISACODYL 10 MG RE SUPP: 10.0000 mg | Freq: Every day | RECTAL | Status: DC | PRN

## 2022-01-24 MED ORDER — SENNOSIDES-DOCUSATE SODIUM 8.6-50 MG PO TABS: 2.0000 | ORAL_TABLET | Freq: Every evening | ORAL | Status: DC | PRN

## 2022-01-24 NOTE — Progress Notes (Signed)
Nutrition Follow-up  DOCUMENTATION CODES:   Not applicable  INTERVENTION:   -Continue with regular diet -Continue MVI with minerals daily -Continue feeding assistance with meals -Continue Ensure Enlive po TID, each supplement provides 350 kcal and 20 grams of protein  NUTRITION DIAGNOSIS:   Inadequate oral intake related to poor appetite as evidenced by meal completion < 50%.  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  Reviewed I/O's: -1 L: x 24 hours and -1.6 L since admission  UOP: 1 L x 24 hours   Pt unavailable at time of visit. Attempted to speak with pt via call to hospital room phone, however, unable to reach.   Pt remains on a regular diet. No meal completion data available to assess. She is accepting Ensure supplements per MAR> RD ordered feeding assistance at last visit.   Per TOC notes, plan to d/c to SNF on 01/27/22, when she has completed COVID-19 quarantine. She previously resided at the [facility].   Labs reviewed.   Diet Order:   Diet Order             Diet regular Room service appropriate? Yes; Fluid consistency: Thin  Diet effective now                   EDUCATION NEEDS:   Education needs have been addressed  Skin:  Skin Assessment: Skin Integrity Issues: Skin Integrity Issues:: Other (Comment) Other: skin breakdown to buttocks  Last BM:  01/21/22  Height:   Ht Readings from Last 1 Encounters:  01/17/22 5\' 3"  (1.6 m)    Weight:   Wt Readings from Last 1 Encounters:  01/17/22 72.6 kg    Ideal Body Weight:  52.3 kg  BMI:  Body mass index is  28.35 kg/m.  Estimated Nutritional Needs:   Kcal:  1550-1750  Protein:  80-95 grams  Fluid:  > 1.5 L    Loistine Chance, RD, LDN, [place] Registered Dietitian II Certified Diabetes Care and Education Specialist Please refer to [facility] for RD and/or RD on-call/weekend/after hours pager

## 2022-01-24 NOTE — Progress Notes (Signed)
Physical Therapy Treatment Patient Details Name: Jane Perez MRN: [id number] DOB: Jan 29, 1931 Today's Date: 01/24/2022   History of Present Illness Jane Perez is an 88 y.o. 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 Admitted  on 01/16/2022 with fall and found to be COVID + with PNA. Has frequent falls as demonstrated by multiple healed fractures on admission imaging including remote T2-4 and L1 compression fractures.    PT Comments    Session limited due to pt perseverating about feeling cold, blankets applied, pt warm to touch, temp assessed several times at 98.6  Pt limited to B LE strengthening/ROM exercises, refused mobility. Will continue to progress as able. Continue to recommend SNF once medically cleared.   Recommendations for follow up therapy are one component of a multi-disciplinary discharge planning process, led by the attending physician.  Recommendations may be updated based on patient status, additional functional criteria and insurance authorization.  Follow Up Recommendations  Skilled nursing-short term rehab (<3 hours/day) Can patient physically be transported by private vehicle: No   Assistance Recommended at Discharge Frequent or constant Supervision/Assistance  Patient can return home with the following Two people to help with walking and/or transfers;Two people to help with bathing/dressing/bathroom;Assist for transportation;Help with stairs or ramp for entrance   Equipment Recommendations  Other (comment) (TBD at next facility)    Recommendations for Other Services       Precautions / Restrictions Precautions Precautions: Fall Restrictions Weight Bearing Restrictions: No Other Position/Activity Restrictions:  (Subacute compression fx of T2-T3, minimally displaced T2 spinous  process)     Mobility  Bed Mobility               General bed mobility comments:  (Refused)    Transfers                        Ambulation/Gait                   Stairs             Wheelchair Mobility    Modified Rankin (Stroke Patients Only)       Balance                                            Cognition Arousal/Alertness: Awake/alert Behavior During Therapy: WFL for tasks assessed/performed Overall Cognitive Status: No family/caregiver present to determine baseline cognitive functioning                                 General Comments:  (Pt perseverating on feeling cold, multiple blankets applied and temp assessed without fever)        Exercises Total Joint Exercises Ankle Circles/Pumps: AROM, Strengthening, Both, 10 reps, 5 reps Heel Slides: AAROM, Strengthening, Both, 10 reps Hip ABduction/ADduction: AAROM, Strengthening, Both, 5 reps, 10 reps Straight Leg Raises: AAROM, Strengthening, Both, 5 reps, 10 reps Other Exercises Other Exercises: Education provided regarding importance of mobility and role of PT    General Comments        Pertinent Vitals/Pain Pain Assessment Pain Assessment: 0-10 Pain Score: 3  Pain Location:  (back) Pain Descriptors / Indicators: Aching, Discomfort  Pain Intervention(s): Limited activity within patient's tolerance    Home Living                          Prior Function            PT Goals (current goals can now be found in the care plan section) Acute Rehab PT Goals Patient Stated Goal: to get out of the hospita    Frequency    Min 2X/week      PT Plan Current plan remains appropriate    Co-evaluation              AM-PAC PT "6 Clicks" Mobility   Outcome Measure  Help needed turning from your back to your side while in a flat bed without using bedrails?: A Lot Help needed moving from lying on your back to sitting on the  side of a flat bed without using bedrails?: A Lot Help needed moving to and from a bed to a chair (including a wheelchair)?: Total Help needed standing up from a chair using your arms (e.g., wheelchair or bedside chair)?: A Lot Help needed to walk in hospital room?: Total Help needed climbing 3-5 steps with a railing? : Total 6 Click Score: 9    End of Session   Activity Tolerance: Patient limited by fatigue Patient left: in bed;with call bell/phone within reach;with bed alarm set Nurse Communication: Mobility status PT Visit Diagnosis: Muscle weakness (generalized) (M62.81);Difficulty in walking, not elsewhere classified (R26.2);Repeated falls (R29.6)     Time: 1202-1220 PT Time Calculation (min) (ACUTE ONLY): 18 min  Charges:  $Therapeutic Exercise: 8-22 mins                    Mikel Cella, PTA    Josie Dixon 01/24/2022, 12:26 PM

## 2022-01-24 NOTE — Progress Notes (Signed)
PROGRESS NOTE    Jane Perez   UYQ:[id number] DOB: 17-Feb-1931  DOA: 01/16/2022 Date of Service: 01/24/22 PCP: Tracie Harrier, MD     Brief Narrative / Hospital Course:  Jane Perez is a 88 y.o. 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 She presented from SNF to the ED on 01/16/2022 with fall. Has frequent falls as demonstrated by multiple healed fractures on admission imaging. 11/08:In the ED, it was found that they had BP 101/59 with otherwise normal vitals. Labs with troponin of 10, BNP 148.  Hemoglobin at baseline at 11.9.  Creatinine near baseline at 1.06.  COVID-positive, urinalysis with moderate leukocyte esterase and many bacteria.  Significant findings included CT head and C-spine T-spine L-spine showing remote compression deformities of T2-T3-T4 and L1 CTA chest negative for PE and showing patchy airspace opacities in the left lower lobe suspicious for pneumonia among other findings including remote rib and clavicle fractures. They were initially treated with paxlovid, steroids and supplemental oxygen. Patient was admitted to medicine service for further workup and management of covid 19  11/14 - 11/16: stable, improving and weaned to room air. Barrier to discharge is SNF acceptance policy s/p COVID. Will not be able to discharge until 11/19  Consultants:  none  Procedures: none      ASSESSMENT & PLAN:   Principal Problem:   Fall Active Problems:   Pneumonia due to COVID-19 virus   Hypoxia   Urinary tract infection   History of bladder cancer   History of DVT (deep vein thrombosis)   Pneumonia   Acute respiratory failure with hypoxia (HCC)   Closed nondisplaced fracture of right clavicle   COVID-19   Viral pneumonia   Acute sepsis ([place])   Fall resulting in no significant injuries.   History of frequent falls Multifactorial in setting of acute illness, hypotension, bradycardia.  - Fall precautions and PT eval/treatment - ortho curbside on clavicle fracture and does not recommend intervention. Likely subacute given patient not complaining of pain   Hypotension- stable - stopped IV maintenance fluids - hold home BP meds   Pneumonia due to COVID-19 virus-  Hypoxic to 89% on presentation, now stable on room air. Procalcitonin normal. Less likely to have secondary bacterial pna.  Blood cultures negative.  - continue Paxlovid, antitussives, albuterol as needed             - paxlovid changed to molnupiravir x5 days due to interaction with eliquis completed yesterday. - Oral dexamethasone completed - Incentive spirometer and supportive care   History of DVT (deep vein thrombosis) Continue Eliquis   History of bladder cancer History of lung cancer Followed by [facility] hospice/palliative care   Urinary tract infection described on admission- she was asymptomatic and urinalysis not consistent with infection. Had Klebsiella UTI 12/09/2021. Received initial dose of rocephin in ED. Urine cx positive for multiple species including proteus mirabilis and enterococcus faecalis. She remains asymptomatic and stable. Will continue to monitor off Abx - follow urine output   Dementia- very pleasant. Mildly confused. Oriented to self and place but not date or circumstances this am - OOB as much as possible    DVT prophylaxis: eliquis  Pertinent IV fluids/nutrition: none Central lines / invasive devices: none  Code Status: FULL CODE  Family Communication: none at this time  Disposition: inpatient, plan d/c SNF TOC needs:  none at this time  Barriers to discharge / significant pending items: SNF protocol for COVID(+) pts precludes d/c              Subjective:  Patient reports feeling okay today other than feeling cold, no CP/SOB         Objective:  Vitals:   01/23/22 0850 01/23/22 1552 01/23/22 2349 01/24/22 0802  BP: 123/77 (!) 115/59 120/62 115/67  Pulse: 73 (!) 104 98 88  Resp: 18 18 18 17   Temp: 98 F (36.7 C) 97.8 F (36.6 C) 97.8 F (36.6 C) 97.6 F (36.4 C)  TempSrc:   Oral   SpO2: 98% 97% 98% 98%  Weight:      Height:        Intake/Output Summary (Last 24 hours) at 01/24/2022 0915 Last data filed at 01/24/2022 0803 Gross per 24 hour  Intake --  Output 1200 ml  Net -1200 ml    Filed Weights   01/17/22 0418  Weight: 72.6 kg    Examination: Constitutional:  VS as above General Appearance: alert, frail, NAD Respiratory: Normal respiratory effort No wheeze Cardiovascular: S1/S2 normal RRR No lower extremity edema Gastrointestinal: No tenderness Musculoskeletal:  No clubbing/cyanosis of digits Symmetrical movement in all extremities Neurological: No cranial nerve deficit on limited exam Alert Psychiatric: Normal mood and affect       Scheduled Medications:   albuterol  2 puff Inhalation Q6H   apixaban  5 mg Oral BID   feeding supplement  237 mL Oral TID BM   gabapentin  100 mg Oral QHS   multivitamin with minerals  1 tablet Oral Daily   traZODone  50 mg Oral QHS    Continuous Infusions:   PRN Medications:  acetaminophen, chlorpheniramine-HYDROcodone, guaiFENesin-dextromethorphan, ondansetron **OR** ondansetron (ZOFRAN) IV, oxyCODONE  Antimicrobials:  Anti-infectives (From admission, onward)    Start     Dose/Rate Route Frequency Ordered Stop   01/18/22 0200  cefTRIAXone (ROCEPHIN) 1 g in sodium chloride 0.9 % 100 mL IVPB  Status:  Discontinued        1 g 200 mL/hr over 30 Minutes Intravenous Every 24 hours 01/17/22 0215 01/17/22 1227   01/17/22 2200  molnupiravir EUA (LAGEVRIO) capsule 800 mg        4 capsule Oral 2 times daily 01/17/22 1027 01/22/22 0945   01/17/22 0145  nirmatrelvir/ritonavir EUA (renal dosing) (PAXLOVID) 2 tablet  Status:   Discontinued        2 tablet Oral 2 times daily 01/17/22 0142 01/17/22 1027   01/17/22 0130  nirmatrelvir/ritonavir EUA (PAXLOVID) 3 tablet  Status:  Discontinued        3 tablet Oral 2 times daily 01/17/22 0118 01/17/22 0142   01/17/22 0130  cefTRIAXone (ROCEPHIN) 1 g in sodium chloride 0.9 % 100 mL IVPB        1 g 200 mL/hr over 30 Minutes Intravenous  Once 01/17/22 0127 01/17/22 0241       Data Reviewed: I have personally reviewed following labs and imaging studies  CBC: Recent Labs  Lab 01/18/22 0651 01/19/22 0527 01/23/22 0453  WBC 4.9 8.2 8.2  HGB 12.2 11.5* 13.3  HCT 38.3 35.8* 40.5  MCV 97.0 96.2 95.1  PLT 192 198 174    Basic Metabolic Panel: Recent Labs  Lab 01/18/22 0651 01/19/22 0527 01/23/22 0453  NA 142 140 138  K 3.7 4.4 4.2  CL 110 108 108  CO2 30 29 27   GLUCOSE 83 132* 93  BUN 16  17 25*  CREATININE 0.87 0.81 0.79  CALCIUM 8.7* 9.0 8.7*    GFR: Estimated Creatinine Clearance: 45.5 mL/min (by C-G formula based on SCr of 0.79 mg/dL). Liver Function Tests: Recent Labs  Lab 01/18/22 0651  AST 25  ALT 18  ALKPHOS 86  BILITOT 0.9  PROT 5.6*  ALBUMIN 2.2*    No results for input(s): "LIPASE", "AMYLASE" in the last 168 hours. No results for input(s): "AMMONIA" in the last 168 hours. Coagulation Profile: No results for input(s): "INR", "PROTIME" in the last 168 hours. Cardiac Enzymes: No results for input(s): "CKTOTAL", "CKMB", "CKMBINDEX", "TROPONINI" in the last 168 hours. BNP (last 3 results) No results for input(s): "PROBNP" in the last 8760 hours. HbA1C: No results for input(s): "HGBA1C" in the last 72 hours. CBG: No results for input(s): "GLUCAP" in the last 168 hours. Lipid Profile: No results for input(s): "CHOL", "HDL", "LDLCALC", "TRIG", "CHOLHDL", "LDLDIRECT" in the last 72 hours. Thyroid Function Tests: No results for input(s): "TSH", "T4TOTAL", "FREET4", "T3FREE", "THYROIDAB" in the last 72 hours. Anemia Panel: No results  for input(s): "VITAMINB12", "FOLATE", "FERRITIN", "TIBC", "IRON", "RETICCTPCT" in the last 72 hours. Urine analysis:    Component Value Date/Time   COLORURINE AMBER (A) 01/16/2022 2355   APPEARANCEUR CLOUDY (A) 01/16/2022 2355   APPEARANCEUR Clear 04/11/2015 1339   LABSPEC 1.011 01/16/2022 2355   PHURINE 8.0 01/16/2022 2355   GLUCOSEU NEGATIVE 01/16/2022 2355   HGBUR NEGATIVE 01/16/2022 2355   BILIRUBINUR NEGATIVE 01/16/2022 2355   BILIRUBINUR Negative 04/11/2015 1339   KETONESUR NEGATIVE 01/16/2022 2355   PROTEINUR 30 (A) 01/16/2022 2355   NITRITE NEGATIVE 01/16/2022 2355   LEUKOCYTESUR MODERATE (A) 01/16/2022 2355   Sepsis Labs: @LABRCNTIP (procalcitonin:4,lacticidven:4)  Recent Results (from the past 240 hour(s))  Resp Panel by RT-PCR (Flu A&B, Covid) Anterior Nasal Swab     Status: Abnormal   Collection Time: 01/16/22 10:33 PM   Specimen: Anterior Nasal Swab  Result Value Ref Range Status   SARS Coronavirus 2 by RT PCR POSITIVE (A) NEGATIVE Final    Comment: (NOTE) SARS-CoV-2 target nucleic acids are DETECTED.  The SARS-CoV-2 RNA is generally detectable in upper respiratory specimens during the acute phase of infection. Positive results are indicative of the presence of the identified virus, but do not rule out bacterial infection or co-infection with other pathogens not detected by the test. Clinical correlation with patient history and other diagnostic information is necessary to determine patient infection status. The expected result is Negative.  Fact Sheet for Patients: EntrepreneurPulse.com.au  Fact Sheet for Healthcare Providers: IncredibleEmployment.be  This test is not yet approved or cleared by the [place] FDA and  has been authorized for detection and/or diagnosis of SARS-CoV-2 by FDA under an Emergency Use Authorization (EUA).  This EUA will remain in effect (meaning this test can be used) for the duration of  the  COVID-19 declaration under Section 564(b)(1) of the A ct, 21 U.S.C. section 360bbb-3(b)(1), unless the authorization is terminated or revoked sooner.     Influenza A by PCR NEGATIVE NEGATIVE Final   Influenza B by PCR NEGATIVE NEGATIVE Final    Comment: (NOTE) The Xpert Xpress SARS-CoV-2/FLU/RSV plus assay is intended as an aid in the diagnosis of influenza from Nasopharyngeal swab specimens and should not be used as a sole basis for treatment. Nasal washings and aspirates are unacceptable for Xpert Xpress SARS-CoV-2/FLU/RSV testing.  Fact Sheet for Patients: EntrepreneurPulse.com.au  Fact Sheet for Healthcare Providers: IncredibleEmployment.be  This test is not yet approved or  cleared by the [place] and has been authorized for detection and/or diagnosis of SARS-CoV-2 by FDA under an Emergency Use Authorization (EUA). This EUA will remain in effect (meaning this test can be used) for the duration of the COVID-19 declaration under Section 564(b)(1) of the Act, 21 U.S.C. section 360bbb-3(b)(1), unless the authorization is terminated or revoked.  Performed at [facility], [address] [place], [place] [address]   Urine Culture     Status: Abnormal   Collection Time: 01/16/22 11:55 PM   Specimen: Urine, Clean Catch  Result Value Ref Range Status   Specimen Description   Final    URINE, CLEAN CATCH Performed at [facility], [address], [place], [place] [address]    Special Requests   Final    NONE Performed at [facility], [address] [place], [place] [address]    Culture (A)  Final    >=100,000 COLONIES/mL PROTEUS MIRABILIS 80,000 COLONIES/mL ENTEROCOCCUS FAECALIS    Report Status 01/19/2022 FINAL  Final   Organism ID, Bacteria PROTEUS MIRABILIS (A)  Final   Organism ID, Bacteria ENTEROCOCCUS FAECALIS (A)  Final      Susceptibility   Enterococcus faecalis - MIC*    AMPICILLIN <=2  SENSITIVE Sensitive     NITROFURANTOIN <=16 SENSITIVE Sensitive     VANCOMYCIN 1 SENSITIVE Sensitive     * 80,000 COLONIES/mL ENTEROCOCCUS FAECALIS   Proteus mirabilis - MIC*    AMPICILLIN <=2 SENSITIVE Sensitive     CEFAZOLIN <=4 SENSITIVE Sensitive     CEFEPIME <=0.12 SENSITIVE Sensitive     CEFTRIAXONE <=0.25 SENSITIVE Sensitive     CIPROFLOXACIN <=0.25 SENSITIVE Sensitive     GENTAMICIN <=1 SENSITIVE Sensitive     IMIPENEM 1 SENSITIVE Sensitive     NITROFURANTOIN 256 RESISTANT Resistant     TRIMETH/SULFA <=20 SENSITIVE Sensitive     AMPICILLIN/SULBACTAM <=2 SENSITIVE Sensitive     PIP/TAZO <=4 SENSITIVE Sensitive     * >=100,000 COLONIES/mL PROTEUS MIRABILIS  Blood culture (routine x 2)     Status: None   Collection Time: 01/17/22  3:38 AM   Specimen: BLOOD  Result Value Ref Range Status   Specimen Description BLOOD LEFT HAND  Final   Special Requests   Final    BOTTLES DRAWN AEROBIC AND ANAEROBIC Blood Culture results may not be optimal due to an inadequate volume of blood received in culture bottles   Culture   Final    NO GROWTH 5 DAYS Performed at [facility], [address], [place], [place] [address]    Report Status 01/22/2022 FINAL  Final  Blood culture (routine x 2)     Status: None   Collection Time: 01/17/22  3:39 AM   Specimen: BLOOD  Result Value Ref Range Status   Specimen Description BLOOD RIGHT [facility]  Final   Special Requests   Final    BOTTLES DRAWN AEROBIC AND ANAEROBIC Blood Culture adequate volume   Culture   Final    NO GROWTH 5 DAYS Performed at [facility], [address] [place], [place] [address]    Report Status 01/22/2022 FINAL  Final         Radiology Studies: CT T-SPINE NO CHARGE  Result Date: 01/17/2022 CLINICAL DATA:  Frequent falls, found down EXAM: CT THORACIC AND LUMBAR SPINE WITHOUT CONTRAST TECHNIQUE: Multidetector CT imaging of the thoracic and lumbar spine was performed without contrast. Multiplanar CT  image reconstructions were also generated. RADIATION DOSE  REDUCTION: This exam was performed according to the departmental dose-optimization program which includes automated exposure control, adjustment of the mA and/or kV according to patient size and/or use of iterative reconstruction technique. COMPARISON:  09/02/2021 CT T and L-spine; correlation is also made with CT cervical spine 12/27/2021 FINDINGS: CT THORACIC SPINE FINDINGS Alignment: No traumatic listhesis. Exaggeration of the normal thoracic kyphosis. Scoliosis. Vertebrae: Osteopenia. Unchanged remote compression deformities T3 and T4. 10% vertebral body height loss and increased density of the superior aspect of T2, which appears unchanged compared to the 12/27/2021 CT cervical spine. Unchanged remote T2 spinous process fracture (series 9, image 56) no definite acute fracture. Paraspinal and other soft tissues: Please see same-day CT chest Disc levels: Multilevel degenerative disc disease without significant spinal canal stenosis. CT LUMBAR SPINE FINDINGS Segmentation: 5 lumbar-type vertebral bodies. Alignment: Exaggeration of the normal lumbar lordosis. Lumbar levoscoliosis. 3 mm anterolisthesis of T12 on L1. 3 mm retrolisthesis of L1 on L2. 3 mm anterolisthesis of L3 on L4, L4 on L5, and L5 on S1. Vertebrae: No acute fracture or suspicious osseous lesion. Redemonstrated remote L1 compression fracture. Vertebral body heights are otherwise preserved. Paraspinal and other soft tissues: Aortic atherosclerosis. Disc levels: T12-L1: Disc height loss and grade 1 anterolisthesis. No significant spinal canal stenosis or neural foraminal narrowing. L1-L2: Disc height loss and moderate disc bulge with annular calcification. Mild facet arthropathy. Moderate spinal canal stenosis and severe right and moderate left neural foraminal narrowing. L2-L3: Mild disc bulge. Mild facet arthropathy. Ligamentum flavum hypertrophy. Moderate spinal canal stenosis. No neural  foraminal narrowing. L3-L4: Trace anterolisthesis disc unroofing and moderate disc bulge. Severe facet arthropathy. Ligamentum flavum hypertrophy. Severe spinal canal stenosis. Severe left neural foraminal narrowing. L4-L5: Anterolisthesis and disc unroofing with moderate right eccentric disc bulge. Severe facet arthropathy. Ligamentum flavum hypertrophy. Mild spinal canal stenosis. No neural foraminal narrowing. L5-S1: Anterolisthesis with disc unroofing. Severe facet arthropathy. Severe spinal canal stenosis. Moderate left neural foraminal narrowing. IMPRESSION: 1. No acute fracture or traumatic listhesis in the thoracic or lumbar spine. 2. Unchanged remote compression deformities of T2, T3, T4, and L1. 3. Multilevel degenerative disc disease and spondylolisthesis, as described above. 4. Aortic atherosclerosis. Aortic Atherosclerosis (ICD10-I70.0). Electronically Signed   By: Merilyn Baba M.D.   On: 01/17/2022 01:56   CT Lumbar Spine Wo Contrast  Result Date: 01/17/2022 CLINICAL DATA:  Frequent falls, found down EXAM: CT THORACIC AND LUMBAR SPINE WITHOUT CONTRAST TECHNIQUE: Multidetector CT imaging of the thoracic and lumbar spine was performed without contrast. Multiplanar CT image reconstructions were also generated. RADIATION DOSE REDUCTION: This exam was performed according to the departmental dose-optimization program which includes automated exposure control, adjustment of the mA and/or kV according to patient size and/or use of iterative reconstruction technique. COMPARISON:  09/02/2021 CT T and L-spine; correlation is also made with CT cervical spine 12/27/2021 FINDINGS: CT THORACIC SPINE FINDINGS Alignment: No traumatic listhesis. Exaggeration of the normal thoracic kyphosis. Scoliosis. Vertebrae: Osteopenia. Unchanged remote compression deformities T3 and T4. 10% vertebral body height loss and increased density of the superior aspect of T2, which appears unchanged compared to the 12/27/2021 CT  cervical spine. Unchanged remote T2 spinous process fracture (series 9, image 56) no definite acute fracture. Paraspinal and other soft tissues: Please see same-day CT chest Disc levels: Multilevel degenerative disc disease without significant spinal canal stenosis. CT LUMBAR SPINE FINDINGS Segmentation: 5 lumbar-type vertebral bodies. Alignment: Exaggeration of the normal lumbar lordosis. Lumbar levoscoliosis. 3 mm anterolisthesis of T12 on L1. 3 mm retrolisthesis  of L1 on L2. 3 mm anterolisthesis of L3 on L4, L4 on L5, and L5 on S1. Vertebrae: No acute fracture or suspicious osseous lesion. Redemonstrated remote L1 compression fracture. Vertebral body heights are otherwise preserved. Paraspinal and other soft tissues: Aortic atherosclerosis. Disc levels: T12-L1: Disc height loss and grade 1 anterolisthesis. No significant spinal canal stenosis or neural foraminal narrowing. L1-L2: Disc height loss and moderate disc bulge with annular calcification. Mild facet arthropathy. Moderate spinal canal stenosis and severe right and moderate left neural foraminal narrowing. L2-L3: Mild disc bulge. Mild facet arthropathy. Ligamentum flavum hypertrophy. Moderate spinal canal stenosis. No neural foraminal narrowing. L3-L4: Trace anterolisthesis disc unroofing and moderate disc bulge. Severe facet arthropathy. Ligamentum flavum hypertrophy. Severe spinal canal stenosis. Severe left neural foraminal narrowing. L4-L5: Anterolisthesis and disc unroofing with moderate right eccentric disc bulge. Severe facet arthropathy. Ligamentum flavum hypertrophy. Mild spinal canal stenosis. No neural foraminal narrowing. L5-S1: Anterolisthesis with disc unroofing. Severe facet arthropathy. Severe spinal canal stenosis. Moderate left neural foraminal narrowing. IMPRESSION: 1. No acute fracture or traumatic listhesis in the thoracic or lumbar spine. 2. Unchanged remote compression deformities of T2, T3, T4, and L1. 3. Multilevel degenerative  disc disease and spondylolisthesis, as described above. 4. Aortic atherosclerosis. Aortic Atherosclerosis (ICD10-I70.0). Electronically Signed   By: Merilyn Baba M.D.   On: 01/17/2022 01:56   CT Angio Chest PE W and/or Wo Contrast  Result Date: 01/17/2022 CLINICAL DATA:  88 year old female with history of DVT and PE on anticoagulation; found down at her facility EXAM: CT ANGIOGRAPHY CHEST WITH CONTRAST TECHNIQUE: Multidetector CT imaging of the chest was performed using the standard protocol during bolus administration of intravenous contrast. Multiplanar CT image reconstructions and MIPs were obtained to evaluate the vascular anatomy. RADIATION DOSE REDUCTION: This exam was performed according to the departmental dose-optimization program which includes automated exposure control, adjustment of the mA and/or kV according to patient size and/or use of iterative reconstruction technique. CONTRAST:  29mL OMNIPAQUE IOHEXOL 350 MG/ML SOLN COMPARISON:  Radiographs 01/16/2022 and CT chest 11/11/2012. FINDINGS: Cardiovascular: Satisfactory opacification of the pulmonary arteries to the segmental level. No evidence of pulmonary embolism. Cardiomegaly. No pericardial effusion. Coronary artery and aortic atherosclerotic calcification. Mediastinum/Nodes: No enlarged mediastinal, hilar, or axillary lymph nodes. Thyroid gland, trachea, and esophagus demonstrate no significant findings. Lungs/Pleura: Respiratory motion obscures fine detail. Mild diffuse interlobular septal thickening. Bronchial wall thickening and mucous plugging in the left-greater-than-right lower lobes. Atelectasis or pneumonia in the left lower lobe and lingula. Upper Abdomen: No acute abnormality. Bilateral adrenal thickening which is slightly nodular on the left slightly increased from 2014 but likely representing benign hyperplasia/adenoma. No follow-up is recommended. Cholecystectomy. Musculoskeletal: Age indeterminate fracture of the proximal right  clavicle which is minimally displaced as a acute to subacute appearance. Numerous subacute and chronic bilateral rib fractures. Marked compression fracture of T4. See separate report from CT of the thoracic spine for details. Review of the MIP images confirms the above findings. IMPRESSION: 1. Negative for acute pulmonary embolism. 2. Patchy airspace opacities bilaterally greatest in the left lower lobe suspicious for pneumonia. 3. Cardiomegaly and interstitial thickening suggestive of pulmonary edema. 4. Acute or subacute proximal right clavicle fracture. 5. Subacute and chronic bilateral rib fractures. 6. Age indeterminate compression fracture of T4. See separate report for findings in the thoracolumbar spine. Electronically Signed   By: Placido Sou M.D.   On: 01/17/2022 01:52   CT HEAD WO CONTRAST (5MM)  Result Date: 01/16/2022 CLINICAL DATA:  Head and neck  trauma after fall on anticoagulation; bruising to the right forehead EXAM: CT HEAD WITHOUT CONTRAST TECHNIQUE: Contiguous axial images were obtained from the base of the skull through the vertex without intravenous contrast. RADIATION DOSE REDUCTION: This exam was performed according to the departmental dose-optimization program which includes automated exposure control, adjustment of the mA and/or kV according to patient size and/or use of iterative reconstruction technique. COMPARISON:  CT head and C-spine 12/27/2021 FINDINGS: CT HEAD FINDINGS Brain: No intracranial hemorrhage, mass effect, or evidence of acute infarct. No hydrocephalus. No extra-axial fluid collection. Generalized cerebral atrophy. Ill-defined hypoattenuation within the cerebral white matter is nonspecific but consistent with chronic small vessel ischemic disease. Vascular: No hyperdense vessel. Intracranial arterial calcification. Skull: No fracture or focal lesion. Scalp hematoma over the right forehead. Sinuses/Orbits: Mucosal thickening with air-fluid levels in both maxillary  sinuses. Mucosal thickening in the ethmoid air cells. Other: None. CT CERVICAL SPINE FINDINGS Alignment: Multiple low-grade vertebral body subluxations are similar to prior. Skull base and vertebrae: No acute fracture. Redemonstrated subacute superior endplate compression fractures and sclerosis of T2 and partially visualized T3. Minimally displaced fracture through the T2 spinous process with signs of sclerosis about the fracture line is also unchanged. Soft tissues and spinal canal: No prevertebral fluid or swelling. No visible canal hematoma. Disc levels: Unchanged multilevel advanced spondylosis and facet arthropathy. Posterior disc osteophyte complexes cause multilevel moderate spinal canal narrowing. Uncovertebral spurring and facet arthropathy cause advanced neural foraminal narrowing bilaterally at C4-C5. Upper chest: Negative. Other: None. IMPRESSION: 1. No acute intracranial abnormality. Generalized atrophy and small vessel white matter disease. 2. No acute fracture in the cervical spine. Multilevel degenerative spondylosis. 3. Subacute compression fractures of T2 and T3 and mildly displaced subacute fracture of the T2 spinous process. 4. Right forehead scalp hematoma.  No calvarial fracture. 5. Mucosal thickening in the ethmoid and bilateral maxillary sinuses with air-fluid levels can be seen with acute sinusitis. Electronically Signed   By: Placido Sou M.D.   On: 01/16/2022 23:47   CT Cervical Spine Wo Contrast  Result Date: 01/16/2022 CLINICAL DATA:  Head and neck trauma after fall on anticoagulation; bruising to the right forehead EXAM: CT HEAD WITHOUT CONTRAST TECHNIQUE: Contiguous axial images were obtained from the base of the skull through the vertex without intravenous contrast. RADIATION DOSE REDUCTION: This exam was performed according to the departmental dose-optimization program which includes automated exposure control, adjustment of the mA and/or kV according to patient size and/or  use of iterative reconstruction technique. COMPARISON:  CT head and C-spine 12/27/2021 FINDINGS: CT HEAD FINDINGS Brain: No intracranial hemorrhage, mass effect, or evidence of acute infarct. No hydrocephalus. No extra-axial fluid collection. Generalized cerebral atrophy. Ill-defined hypoattenuation within the cerebral white matter is nonspecific but consistent with chronic small vessel ischemic disease. Vascular: No hyperdense vessel. Intracranial arterial calcification. Skull: No fracture or focal lesion. Scalp hematoma over the right forehead. Sinuses/Orbits: Mucosal thickening with air-fluid levels in both maxillary sinuses. Mucosal thickening in the ethmoid air cells. Other: None. CT CERVICAL SPINE FINDINGS Alignment: Multiple low-grade vertebral body subluxations are similar to prior. Skull base and vertebrae: No acute fracture. Redemonstrated subacute superior endplate compression fractures and sclerosis of T2 and partially visualized T3. Minimally displaced fracture through the T2 spinous process with signs of sclerosis about the fracture line is also unchanged. Soft tissues and spinal canal: No prevertebral fluid or swelling. No visible canal hematoma. Disc levels: Unchanged multilevel advanced spondylosis and facet arthropathy. Posterior disc osteophyte complexes cause multilevel moderate spinal  canal narrowing. Uncovertebral spurring and facet arthropathy cause advanced neural foraminal narrowing bilaterally at C4-C5. Upper chest: Negative. Other: None. IMPRESSION: 1. No acute intracranial abnormality. Generalized atrophy and small vessel white matter disease. 2. No acute fracture in the cervical spine. Multilevel degenerative spondylosis. 3. Subacute compression fractures of T2 and T3 and mildly displaced subacute fracture of the T2 spinous process. 4. Right forehead scalp hematoma.  No calvarial fracture. 5. Mucosal thickening in the ethmoid and bilateral maxillary sinuses with air-fluid levels can be  seen with acute sinusitis. Electronically Signed   By: Placido Sou M.D.   On: 01/16/2022 23:47   DG Chest 1 View  Result Date: 01/16/2022 CLINICAL DATA:  Chest pain EXAM: CHEST  1 VIEW COMPARISON:  12/09/2021, CT 11/11/2012 FINDINGS: Mild cardiomegaly. Mild diffuse reticular interstitial opacity. Probable subsegmental atelectasis at the bases. No pneumothorax. Aortic atherosclerosis. Old right clavicle fracture. IMPRESSION: 1. Mild diffuse reticular interstitial opacity suggestive of underlying chronic disease. There is probable subsegmental atelectasis at the bases 2. Cardiomegaly Electronically Signed   By: Donavan Foil M.D.   On: 01/16/2022 23:41            LOS: 7 days    Time spent: [place], DO [facility] 01/24/2022, 9:15 AM   Staff may message me via secure chat in [place]  but this may not receive immediate response,  please page for urgent matters!  If 7PM-7AM, please contact night-coverage [email or web address]  Dictation software was used to generate the above note. Typos may occur and escape review, as with typed/written notes. Please contact Dr Sheppard Coil directly for clarity if needed.

## 2022-01-25 DIAGNOSIS — J189 Pneumonia, unspecified organism: Secondary | ICD-10-CM | POA: Diagnosis not present

## 2022-01-25 DIAGNOSIS — S42001A Fracture of unspecified part of right clavicle, initial encounter for closed fracture: Secondary | ICD-10-CM | POA: Diagnosis not present

## 2022-01-25 DIAGNOSIS — U071 COVID-19: Secondary | ICD-10-CM | POA: Diagnosis not present

## 2022-01-25 DIAGNOSIS — W19XXXA Unspecified fall, initial encounter: Secondary | ICD-10-CM | POA: Diagnosis not present

## 2022-01-25 NOTE — Progress Notes (Signed)
PROGRESS NOTE    Jane Perez   PJA:[id number] DOB: 03-Feb-1931  DOA: 01/16/2022 Date of Service: 01/25/22 PCP: Tracie Harrier, MD     Brief Narrative / Hospital Course:  Jane Perez is a 88 y.o. 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 She presented from SNF to the ED on 01/16/2022 with fall. Has frequent falls as demonstrated by multiple healed fractures on admission imaging. 11/08:In the ED, it was found that they had BP 101/59 with otherwise normal vitals. Labs with troponin of 10, BNP 148.  Hemoglobin at baseline at 11.9.  Creatinine near baseline at 1.06.  COVID-positive, urinalysis with moderate leukocyte esterase and many bacteria.  Significant findings included CT head and C-spine T-spine L-spine showing remote compression deformities of T2-T3-T4 and L1 CTA chest negative for PE and showing patchy airspace opacities in the left lower lobe suspicious for pneumonia among other findings including remote rib and clavicle fractures. They were initially treated with paxlovid, steroids and supplemental oxygen. Patient was admitted to medicine service for further workup and management of covid 19  11/14 - 11/17: stable, improving and weaned to room air. Barrier to discharge is SNF acceptance policy s/p COVID. Will not be able to discharge until 11/19  Consultants:  none  Procedures: none      ASSESSMENT & PLAN:   Principal Problem:   Fall Active Problems:   Pneumonia due to COVID-19 virus   Hypoxia   Urinary tract infection   History of bladder cancer   History of DVT (deep vein thrombosis)   Pneumonia   Acute respiratory failure with hypoxia (HCC)   Closed nondisplaced fracture of right clavicle   COVID-19   Viral pneumonia   Acute sepsis ([place])   Fall resulting in no significant injuries.   History of frequent falls Multifactorial in setting of acute illness, hypotension, bradycardia.  - Fall precautions and PT eval/treatment - ortho curbside on clavicle fracture and does not recommend intervention. Likely subacute given patient not complaining of pain   Hypotension- stable - stopped IV maintenance fluids - hold home BP meds   Pneumonia due to COVID-19 virus-  Hypoxic to 89% on presentation, now stable on room air. Procalcitonin normal. Less likely to have secondary bacterial pna.  Blood cultures negative.  - continue Paxlovid, antitussives, albuterol as needed             - paxlovid changed to molnupiravir x5 days due to interaction with eliquis completed yesterday. - Oral dexamethasone completed - Incentive spirometer and supportive care   History of DVT (deep vein thrombosis) - Continue Eliquis   History of bladder cancer History of lung cancer - Followed by [facility] hospice/palliative care   Urinary tract infection described on admission- she was asymptomatic and urinalysis not consistent with infection. Had Klebsiella UTI 12/09/2021. Received initial dose of rocephin in ED. Urine cx positive for multiple species including proteus mirabilis and enterococcus faecalis. She remains asymptomatic and stable. Will continue to monitor off Abx - follow urine output   Dementia- very pleasant. Mildly confused. Oriented to self and place - OOB as much as possible     DVT prophylaxis: eliquis  Pertinent IV fluids/nutrition: none Central lines / invasive devices: none  Code Status: FULL CODE  Family Communication: none at this time  Disposition: inpatient, plan d/c SNF TOC needs: none at this time  Barriers to discharge / significant pending items: SNF protocol for COVID(+) pts precludes d/c              Subjective:  Patient reports feeling okay today, no complaints, sitting at bedside eating lunch.        Objective:  Vitals:   01/24/22 0802  01/24/22 1604 01/25/22 0052 01/25/22 0809  BP: 115/67 103/60 111/66 107/64  Pulse: 88 (!) 105 93 97  Resp: 17 17 16 16   Temp: 97.6 F (36.4 C) 98.5 F (36.9 C) 98.3 F (36.8 C) 98 F (36.7 C)  TempSrc:      SpO2: 98% 95% 92% (!) 87%  Weight:      Height:        Intake/Output Summary (Last 24 hours) at 01/25/2022 1317 Last data filed at 01/24/2022 2015 Gross per 24 hour  Intake 240 ml  Output --  Net 240 ml   Filed Weights   01/17/22 0418  Weight: 72.6 kg    Examination: Constitutional:  VS as above General Appearance: alert, frail, NAD Respiratory: Normal respiratory effort Cardiovascular: No lower extremity edema Musculoskeletal:  No clubbing/cyanosis of digits Symmetrical movement in all extremities Neurological: No cranial nerve deficit on limited exam Alert Psychiatric: Normal mood and affect       Scheduled Medications:   albuterol  2 puff Inhalation Q6H   apixaban  5 mg Oral BID   feeding supplement  237 mL Oral TID BM   gabapentin  100 mg Oral QHS   multivitamin with minerals  1 tablet Oral Daily   polyethylene glycol  17 g Oral Daily   traZODone  50 mg Oral QHS    Continuous Infusions:   PRN Medications:  acetaminophen, bisacodyl, chlorpheniramine-HYDROcodone, guaiFENesin-dextromethorphan, ondansetron **OR** ondansetron (ZOFRAN) IV, oxyCODONE, senna-docusate  Antimicrobials:  Anti-infectives (From admission, onward)    Start     Dose/Rate Route Frequency Ordered Stop   01/18/22 0200  cefTRIAXone (ROCEPHIN) 1 g in sodium chloride 0.9 % 100 mL IVPB  Status:  Discontinued        1 g 200 mL/hr over 30 Minutes Intravenous Every 24 hours 01/17/22 0215 01/17/22 1227   01/17/22 2200  molnupiravir EUA (LAGEVRIO) capsule 800 mg        4 capsule Oral 2 times daily 01/17/22 1027 01/22/22 0945   01/17/22 0145  nirmatrelvir/ritonavir EUA (renal dosing) (PAXLOVID) 2 tablet  Status:  Discontinued        2 tablet Oral 2 times daily 01/17/22 0142  01/17/22 1027   01/17/22 0130  nirmatrelvir/ritonavir EUA (PAXLOVID) 3 tablet  Status:  Discontinued        3 tablet Oral 2 times daily 01/17/22 0118 01/17/22 0142   01/17/22 0130  cefTRIAXone (ROCEPHIN) 1 g in sodium chloride 0.9 % 100 mL IVPB        1 g 200 mL/hr over 30 Minutes Intravenous  Once 01/17/22 0127 01/17/22 0241       Data Reviewed: I have personally reviewed following labs and imaging studies  CBC: Recent Labs  Lab 01/19/22 0527 01/23/22 0453  WBC 8.2 8.2  HGB 11.5* 13.3  HCT 35.8* 40.5  MCV 96.2 95.1  PLT 198 938   Basic Metabolic Panel: Recent Labs  Lab 01/19/22 0527 01/23/22 0453  NA 140 138  K 4.4 4.2  CL 108 108  CO2 29 27  GLUCOSE 132* 93  BUN 17 25*  CREATININE 0.81 0.79  CALCIUM 9.0 8.7*   GFR: Estimated Creatinine Clearance: 45.5 mL/min (  by C-G formula based on SCr of 0.79 mg/dL). Liver Function Tests: No results for input(s): "AST", "ALT", "ALKPHOS", "BILITOT", "PROT", "ALBUMIN" in the last 168 hours.  No results for input(s): "LIPASE", "AMYLASE" in the last 168 hours. No results for input(s): "AMMONIA" in the last 168 hours. Coagulation Profile: No results for input(s): "INR", "PROTIME" in the last 168 hours. Cardiac Enzymes: No results for input(s): "CKTOTAL", "CKMB", "CKMBINDEX", "TROPONINI" in the last 168 hours. BNP (last 3 results) No results for input(s): "PROBNP" in the last 8760 hours. HbA1C: No results for input(s): "HGBA1C" in the last 72 hours. CBG: No results for input(s): "GLUCAP" in the last 168 hours. Lipid Profile: No results for input(s): "CHOL", "HDL", "LDLCALC", "TRIG", "CHOLHDL", "LDLDIRECT" in the last 72 hours. Thyroid Function Tests: No results for input(s): "TSH", "T4TOTAL", "FREET4", "T3FREE", "THYROIDAB" in the last 72 hours. Anemia Panel: No results for input(s): "VITAMINB12", "FOLATE", "FERRITIN", "TIBC", "IRON", "RETICCTPCT" in the last 72 hours. Urine analysis:    Component Value Date/Time    COLORURINE AMBER (A) 01/16/2022 2355   APPEARANCEUR CLOUDY (A) 01/16/2022 2355   APPEARANCEUR Clear 04/11/2015 1339   LABSPEC 1.011 01/16/2022 2355   PHURINE 8.0 01/16/2022 2355   GLUCOSEU NEGATIVE 01/16/2022 2355   HGBUR NEGATIVE 01/16/2022 2355   BILIRUBINUR NEGATIVE 01/16/2022 2355   BILIRUBINUR Negative 04/11/2015 1339   KETONESUR NEGATIVE 01/16/2022 2355   PROTEINUR 30 (A) 01/16/2022 2355   NITRITE NEGATIVE 01/16/2022 2355   LEUKOCYTESUR MODERATE (A) 01/16/2022 2355   Sepsis Labs: @LABRCNTIP (procalcitonin:4,lacticidven:4)  Recent Results (from the past 240 hour(s))  Resp Panel by RT-PCR (Flu A&B, Covid) Anterior Nasal Swab     Status: Abnormal   Collection Time: 01/16/22 10:33 PM   Specimen: Anterior Nasal Swab  Result Value Ref Range Status   SARS Coronavirus 2 by RT PCR POSITIVE (A) NEGATIVE Final    Comment: (NOTE) SARS-CoV-2 target nucleic acids are DETECTED.  The SARS-CoV-2 RNA is generally detectable in upper respiratory specimens during the acute phase of infection. Positive results are indicative of the presence of the identified virus, but do not rule out bacterial infection or co-infection with other pathogens not detected by the test. Clinical correlation with patient history and other diagnostic information is necessary to determine patient infection status. The expected result is Negative.  Fact Sheet for Patients: EntrepreneurPulse.com.au  Fact Sheet for Healthcare Providers: IncredibleEmployment.be  This test is not yet approved or cleared by the [place] FDA and  has been authorized for detection and/or diagnosis of SARS-CoV-2 by FDA under an Emergency Use Authorization (EUA).  This EUA will remain in effect (meaning this test can be used) for the duration of  the COVID-19 declaration under Section 564(b)(1) of the A ct, 21 U.S.C. section 360bbb-3(b)(1), unless the authorization is terminated or revoked  sooner.     Influenza A by PCR NEGATIVE NEGATIVE Final   Influenza B by PCR NEGATIVE NEGATIVE Final    Comment: (NOTE) The Xpert Xpress SARS-CoV-2/FLU/RSV plus assay is intended as an aid in the diagnosis of influenza from Nasopharyngeal swab specimens and should not be used as a sole basis for treatment. Nasal washings and aspirates are unacceptable for Xpert Xpress SARS-CoV-2/FLU/RSV testing.  Fact Sheet for Patients: EntrepreneurPulse.com.au  Fact Sheet for Healthcare Providers: IncredibleEmployment.be  This test is not yet approved or cleared by the [place] FDA and has been authorized for detection and/or diagnosis of SARS-CoV-2 by FDA under an Emergency Use Authorization (EUA). This EUA will remain in effect (meaning this  test can be used) for the duration of the COVID-19 declaration under Section 564(b)(1) of the Act, 21 U.S.C. section 360bbb-3(b)(1), unless the authorization is terminated or revoked.  Performed at [facility], [address] [place], [place] [address]   Urine Culture     Status: Abnormal   Collection Time: 01/16/22 11:55 PM   Specimen: Urine, Clean Catch  Result Value Ref Range Status   Specimen Description   Final    URINE, CLEAN CATCH Performed at [facility], [address], [place], [place] [address]    Special Requests   Final    NONE Performed at [facility], [address] [place], [place] [address]    Culture (A)  Final    >=100,000 COLONIES/mL PROTEUS MIRABILIS 80,000 COLONIES/mL ENTEROCOCCUS FAECALIS    Report Status 01/19/2022 FINAL  Final   Organism ID, Bacteria PROTEUS MIRABILIS (A)  Final   Organism ID, Bacteria ENTEROCOCCUS FAECALIS (A)  Final      Susceptibility   Enterococcus faecalis - MIC*    AMPICILLIN <=2 SENSITIVE Sensitive     NITROFURANTOIN <=16 SENSITIVE Sensitive     VANCOMYCIN 1 SENSITIVE Sensitive     * 80,000 COLONIES/mL ENTEROCOCCUS  FAECALIS   Proteus mirabilis - MIC*    AMPICILLIN <=2 SENSITIVE Sensitive     CEFAZOLIN <=4 SENSITIVE Sensitive     CEFEPIME <=0.12 SENSITIVE Sensitive     CEFTRIAXONE <=0.25 SENSITIVE Sensitive     CIPROFLOXACIN <=0.25 SENSITIVE Sensitive     GENTAMICIN <=1 SENSITIVE Sensitive     IMIPENEM 1 SENSITIVE Sensitive     NITROFURANTOIN 256 RESISTANT Resistant     TRIMETH/SULFA <=20 SENSITIVE Sensitive     AMPICILLIN/SULBACTAM <=2 SENSITIVE Sensitive     PIP/TAZO <=4 SENSITIVE Sensitive     * >=100,000 COLONIES/mL PROTEUS MIRABILIS  Blood culture (routine x 2)     Status: None   Collection Time: 01/17/22  3:38 AM   Specimen: BLOOD  Result Value Ref Range Status   Specimen Description BLOOD LEFT HAND  Final   Special Requests   Final    BOTTLES DRAWN AEROBIC AND ANAEROBIC Blood Culture results may not be optimal due to an inadequate volume of blood received in culture bottles   Culture   Final    NO GROWTH 5 DAYS Performed at [facility], [address] [place], [place] [address]    Report Status 01/22/2022 FINAL  Final  Blood culture (routine x 2)     Status: None   Collection Time: 01/17/22  3:39 AM   Specimen: BLOOD  Result Value Ref Range Status   Specimen Description BLOOD RIGHT [facility]  Final   Special Requests   Final    BOTTLES DRAWN AEROBIC AND ANAEROBIC Blood Culture adequate volume   Culture   Final    NO GROWTH 5 DAYS Performed at [facility], [address] [place],  [address]    Report Status 01/22/2022 FINAL  Final         Radiology Studies: CT T-SPINE NO CHARGE  Result Date: 01/17/2022 CLINICAL DATA:  Frequent falls, found down EXAM: CT THORACIC AND LUMBAR SPINE WITHOUT CONTRAST TECHNIQUE: Multidetector CT imaging of the thoracic and lumbar spine was performed without contrast. Multiplanar CT image reconstructions were also generated. RADIATION DOSE REDUCTION: This exam was performed according to the departmental dose-optimization  program which includes automated exposure control, adjustment of the mA and/or kV according to patient size and/or use of iterative reconstruction technique.  COMPARISON:  09/02/2021 CT T and L-spine; correlation is also made with CT cervical spine 12/27/2021 FINDINGS: CT THORACIC SPINE FINDINGS Alignment: No traumatic listhesis. Exaggeration of the normal thoracic kyphosis. Scoliosis. Vertebrae: Osteopenia. Unchanged remote compression deformities T3 and T4. 10% vertebral body height loss and increased density of the superior aspect of T2, which appears unchanged compared to the 12/27/2021 CT cervical spine. Unchanged remote T2 spinous process fracture (series 9, image 56) no definite acute fracture. Paraspinal and other soft tissues: Please see same-day CT chest Disc levels: Multilevel degenerative disc disease without significant spinal canal stenosis. CT LUMBAR SPINE FINDINGS Segmentation: 5 lumbar-type vertebral bodies. Alignment: Exaggeration of the normal lumbar lordosis. Lumbar levoscoliosis. 3 mm anterolisthesis of T12 on L1. 3 mm retrolisthesis of L1 on L2. 3 mm anterolisthesis of L3 on L4, L4 on L5, and L5 on S1. Vertebrae: No acute fracture or suspicious osseous lesion. Redemonstrated remote L1 compression fracture. Vertebral body heights are otherwise preserved. Paraspinal and other soft tissues: Aortic atherosclerosis. Disc levels: T12-L1: Disc height loss and grade 1 anterolisthesis. No significant spinal canal stenosis or neural foraminal narrowing. L1-L2: Disc height loss and moderate disc bulge with annular calcification. Mild facet arthropathy. Moderate spinal canal stenosis and severe right and moderate left neural foraminal narrowing. L2-L3: Mild disc bulge. Mild facet arthropathy. Ligamentum flavum hypertrophy. Moderate spinal canal stenosis. No neural foraminal narrowing. L3-L4: Trace anterolisthesis disc unroofing and moderate disc bulge. Severe facet arthropathy. Ligamentum flavum  hypertrophy. Severe spinal canal stenosis. Severe left neural foraminal narrowing. L4-L5: Anterolisthesis and disc unroofing with moderate right eccentric disc bulge. Severe facet arthropathy. Ligamentum flavum hypertrophy. Mild spinal canal stenosis. No neural foraminal narrowing. L5-S1: Anterolisthesis with disc unroofing. Severe facet arthropathy. Severe spinal canal stenosis. Moderate left neural foraminal narrowing. IMPRESSION: 1. No acute fracture or traumatic listhesis in the thoracic or lumbar spine. 2. Unchanged remote compression deformities of T2, T3, T4, and L1. 3. Multilevel degenerative disc disease and spondylolisthesis, as described above. 4. Aortic atherosclerosis. Aortic Atherosclerosis (ICD10-I70.0). Electronically Signed   By: Merilyn Baba M.D.   On: 01/17/2022 01:56   CT Lumbar Spine Wo Contrast  Result Date: 01/17/2022 CLINICAL DATA:  Frequent falls, found down EXAM: CT THORACIC AND LUMBAR SPINE WITHOUT CONTRAST TECHNIQUE: Multidetector CT imaging of the thoracic and lumbar spine was performed without contrast. Multiplanar CT image reconstructions were also generated. RADIATION DOSE REDUCTION: This exam was performed according to the departmental dose-optimization program which includes automated exposure control, adjustment of the mA and/or kV according to patient size and/or use of iterative reconstruction technique. COMPARISON:  09/02/2021 CT T and L-spine; correlation is also made with CT cervical spine 12/27/2021 FINDINGS: CT THORACIC SPINE FINDINGS Alignment: No traumatic listhesis. Exaggeration of the normal thoracic kyphosis. Scoliosis. Vertebrae: Osteopenia. Unchanged remote compression deformities T3 and T4. 10% vertebral body height loss and increased density of the superior aspect of T2, which appears unchanged compared to the 12/27/2021 CT cervical spine. Unchanged remote T2 spinous process fracture (series 9, image 56) no definite acute fracture. Paraspinal and other soft  tissues: Please see same-day CT chest Disc levels: Multilevel degenerative disc disease without significant spinal canal stenosis. CT LUMBAR SPINE FINDINGS Segmentation: 5 lumbar-type vertebral bodies. Alignment: Exaggeration of the normal lumbar lordosis. Lumbar levoscoliosis. 3 mm anterolisthesis of T12 on L1. 3 mm retrolisthesis of L1 on L2. 3 mm anterolisthesis of L3 on L4, L4 on L5, and L5 on S1. Vertebrae: No acute fracture or suspicious osseous lesion. Redemonstrated remote L1 compression fracture. Vertebral  body heights are otherwise preserved. Paraspinal and other soft tissues: Aortic atherosclerosis. Disc levels: T12-L1: Disc height loss and grade 1 anterolisthesis. No significant spinal canal stenosis or neural foraminal narrowing. L1-L2: Disc height loss and moderate disc bulge with annular calcification. Mild facet arthropathy. Moderate spinal canal stenosis and severe right and moderate left neural foraminal narrowing. L2-L3: Mild disc bulge. Mild facet arthropathy. Ligamentum flavum hypertrophy. Moderate spinal canal stenosis. No neural foraminal narrowing. L3-L4: Trace anterolisthesis disc unroofing and moderate disc bulge. Severe facet arthropathy. Ligamentum flavum hypertrophy. Severe spinal canal stenosis. Severe left neural foraminal narrowing. L4-L5: Anterolisthesis and disc unroofing with moderate right eccentric disc bulge. Severe facet arthropathy. Ligamentum flavum hypertrophy. Mild spinal canal stenosis. No neural foraminal narrowing. L5-S1: Anterolisthesis with disc unroofing. Severe facet arthropathy. Severe spinal canal stenosis. Moderate left neural foraminal narrowing. IMPRESSION: 1. No acute fracture or traumatic listhesis in the thoracic or lumbar spine. 2. Unchanged remote compression deformities of T2, T3, T4, and L1. 3. Multilevel degenerative disc disease and spondylolisthesis, as described above. 4. Aortic atherosclerosis. Aortic Atherosclerosis (ICD10-I70.0). Electronically  Signed   By: Merilyn Baba M.D.   On: 01/17/2022 01:56   CT Angio Chest PE W and/or Wo Contrast  Result Date: 01/17/2022 CLINICAL DATA:  88 year old female with history of DVT and PE on anticoagulation; found down at her facility EXAM: CT ANGIOGRAPHY CHEST WITH CONTRAST TECHNIQUE: Multidetector CT imaging of the chest was performed using the standard protocol during bolus administration of intravenous contrast. Multiplanar CT image reconstructions and MIPs were obtained to evaluate the vascular anatomy. RADIATION DOSE REDUCTION: This exam was performed according to the departmental dose-optimization program which includes automated exposure control, adjustment of the mA and/or kV according to patient size and/or use of iterative reconstruction technique. CONTRAST:  41mL OMNIPAQUE IOHEXOL 350 MG/ML SOLN COMPARISON:  Radiographs 01/16/2022 and CT chest 11/11/2012. FINDINGS: Cardiovascular: Satisfactory opacification of the pulmonary arteries to the segmental level. No evidence of pulmonary embolism. Cardiomegaly. No pericardial effusion. Coronary artery and aortic atherosclerotic calcification. Mediastinum/Nodes: No enlarged mediastinal, hilar, or axillary lymph nodes. Thyroid gland, trachea, and esophagus demonstrate no significant findings. Lungs/Pleura: Respiratory motion obscures fine detail. Mild diffuse interlobular septal thickening. Bronchial wall thickening and mucous plugging in the left-greater-than-right lower lobes. Atelectasis or pneumonia in the left lower lobe and lingula. Upper Abdomen: No acute abnormality. Bilateral adrenal thickening which is slightly nodular on the left slightly increased from 2014 but likely representing benign hyperplasia/adenoma. No follow-up is recommended. Cholecystectomy. Musculoskeletal: Age indeterminate fracture of the proximal right clavicle which is minimally displaced as a acute to subacute appearance. Numerous subacute and chronic bilateral rib fractures. Marked  compression fracture of T4. See separate report from CT of the thoracic spine for details. Review of the MIP images confirms the above findings. IMPRESSION: 1. Negative for acute pulmonary embolism. 2. Patchy airspace opacities bilaterally greatest in the left lower lobe suspicious for pneumonia. 3. Cardiomegaly and interstitial thickening suggestive of pulmonary edema. 4. Acute or subacute proximal right clavicle fracture. 5. Subacute and chronic bilateral rib fractures. 6. Age indeterminate compression fracture of T4. See separate report for findings in the thoracolumbar spine. Electronically Signed   By: Placido Sou M.D.   On: 01/17/2022 01:52   CT HEAD WO CONTRAST (5MM)  Result Date: 01/16/2022 CLINICAL DATA:  Head and neck trauma after fall on anticoagulation; bruising to the right forehead EXAM: CT HEAD WITHOUT CONTRAST TECHNIQUE: Contiguous axial images were obtained from the base of the skull through the vertex without intravenous  contrast. RADIATION DOSE REDUCTION: This exam was performed according to the departmental dose-optimization program which includes automated exposure control, adjustment of the mA and/or kV according to patient size and/or use of iterative reconstruction technique. COMPARISON:  CT head and C-spine 12/27/2021 FINDINGS: CT HEAD FINDINGS Brain: No intracranial hemorrhage, mass effect, or evidence of acute infarct. No hydrocephalus. No extra-axial fluid collection. Generalized cerebral atrophy. Ill-defined hypoattenuation within the cerebral white matter is nonspecific but consistent with chronic small vessel ischemic disease. Vascular: No hyperdense vessel. Intracranial arterial calcification. Skull: No fracture or focal lesion. Scalp hematoma over the right forehead. Sinuses/Orbits: Mucosal thickening with air-fluid levels in both maxillary sinuses. Mucosal thickening in the ethmoid air cells. Other: None. CT CERVICAL SPINE FINDINGS Alignment: Multiple low-grade vertebral  body subluxations are similar to prior. Skull base and vertebrae: No acute fracture. Redemonstrated subacute superior endplate compression fractures and sclerosis of T2 and partially visualized T3. Minimally displaced fracture through the T2 spinous process with signs of sclerosis about the fracture line is also unchanged. Soft tissues and spinal canal: No prevertebral fluid or swelling. No visible canal hematoma. Disc levels: Unchanged multilevel advanced spondylosis and facet arthropathy. Posterior disc osteophyte complexes cause multilevel moderate spinal canal narrowing. Uncovertebral spurring and facet arthropathy cause advanced neural foraminal narrowing bilaterally at C4-C5. Upper chest: Negative. Other: None. IMPRESSION: 1. No acute intracranial abnormality. Generalized atrophy and small vessel white matter disease. 2. No acute fracture in the cervical spine. Multilevel degenerative spondylosis. 3. Subacute compression fractures of T2 and T3 and mildly displaced subacute fracture of the T2 spinous process. 4. Right forehead scalp hematoma.  No calvarial fracture. 5. Mucosal thickening in the ethmoid and bilateral maxillary sinuses with air-fluid levels can be seen with acute sinusitis. Electronically Signed   By: Placido Sou M.D.   On: 01/16/2022 23:47   CT Cervical Spine Wo Contrast  Result Date: 01/16/2022 CLINICAL DATA:  Head and neck trauma after fall on anticoagulation; bruising to the right forehead EXAM: CT HEAD WITHOUT CONTRAST TECHNIQUE: Contiguous axial images were obtained from the base of the skull through the vertex without intravenous contrast. RADIATION DOSE REDUCTION: This exam was performed according to the departmental dose-optimization program which includes automated exposure control, adjustment of the mA and/or kV according to patient size and/or use of iterative reconstruction technique. COMPARISON:  CT head and C-spine 12/27/2021 FINDINGS: CT HEAD FINDINGS Brain: No  intracranial hemorrhage, mass effect, or evidence of acute infarct. No hydrocephalus. No extra-axial fluid collection. Generalized cerebral atrophy. Ill-defined hypoattenuation within the cerebral white matter is nonspecific but consistent with chronic small vessel ischemic disease. Vascular: No hyperdense vessel. Intracranial arterial calcification. Skull: No fracture or focal lesion. Scalp hematoma over the right forehead. Sinuses/Orbits: Mucosal thickening with air-fluid levels in both maxillary sinuses. Mucosal thickening in the ethmoid air cells. Other: None. CT CERVICAL SPINE FINDINGS Alignment: Multiple low-grade vertebral body subluxations are similar to prior. Skull base and vertebrae: No acute fracture. Redemonstrated subacute superior endplate compression fractures and sclerosis of T2 and partially visualized T3. Minimally displaced fracture through the T2 spinous process with signs of sclerosis about the fracture line is also unchanged. Soft tissues and spinal canal: No prevertebral fluid or swelling. No visible canal hematoma. Disc levels: Unchanged multilevel advanced spondylosis and facet arthropathy. Posterior disc osteophyte complexes cause multilevel moderate spinal canal narrowing. Uncovertebral spurring and facet arthropathy cause advanced neural foraminal narrowing bilaterally at C4-C5. Upper chest: Negative. Other: None. IMPRESSION: 1. No acute intracranial abnormality. Generalized atrophy and small vessel white  matter disease. 2. No acute fracture in the cervical spine. Multilevel degenerative spondylosis. 3. Subacute compression fractures of T2 and T3 and mildly displaced subacute fracture of the T2 spinous process. 4. Right forehead scalp hematoma.  No calvarial fracture. 5. Mucosal thickening in the ethmoid and bilateral maxillary sinuses with air-fluid levels can be seen with acute sinusitis. Electronically Signed   By: Placido Sou M.D.   On: 01/16/2022 23:47   DG Chest 1  View  Result Date: 01/16/2022 CLINICAL DATA:  Chest pain EXAM: CHEST  1 VIEW COMPARISON:  12/09/2021, CT 11/11/2012 FINDINGS: Mild cardiomegaly. Mild diffuse reticular interstitial opacity. Probable subsegmental atelectasis at the bases. No pneumothorax. Aortic atherosclerosis. Old right clavicle fracture. IMPRESSION: 1. Mild diffuse reticular interstitial opacity suggestive of underlying chronic disease. There is probable subsegmental atelectasis at the bases 2. Cardiomegaly Electronically Signed   By: Donavan Foil M.D.   On: 01/16/2022 23:41            LOS: 8 days    Time spent: [place], DO [facility] 01/25/2022, 1:17 PM   Staff may message me via secure chat in [place]  but this may not receive immediate response,  please page for urgent matters!  If 7PM-7AM, please contact night-coverage [email or web address]  Dictation software was used to generate the above note. Typos may occur and escape review, as with typed/written notes. Please contact Dr Sheppard Coil directly for clarity if needed.

## 2022-01-25 NOTE — TOC Progression Note (Signed)
Transition of Care [facility]) - Progression Note    Patient Details  Name: Jane Perez MRN: [id number] Date of Birth: 22-Apr-1930  Transition of Care [facility]) CM/SW [place], RN Phone Number: 01/25/2022, 9:25 AM  Clinical Narrative:     Jeralene Huff out to son [name] at [phone], unable to reach, left a general VM asking for a return call to review the 2 bed offers that were obtained  She will need bed choice as well as Ins approval, quarantine will be complete on 11/19 Patient is alert to self only. Her mentation seems to be declining.    Expected Discharge Plan: [place] Barriers to Discharge: Continued Medical Work up, Ship broker  Expected Discharge Plan and Services Expected Discharge Plan: [place]       Living arrangements for the past 2 months: [place]                                       Social Determinants of Health (SDOH) Interventions    Readmission Risk Interventions     No data to display

## 2022-01-25 NOTE — Progress Notes (Signed)
Physical Therapy Treatment Patient Details Name: Jane Perez MRN: [id number] DOB: 1930/11/30 Today's Date: 01/25/2022   History of Present Illness Jane Perez is an 88 y.o. 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 Admitted  on 01/16/2022 with fall and found to be COVID + with PNA. Has frequent falls as demonstrated by multiple healed fractures on admission imaging including remote T2-4 and L1 compression fractures.    PT Comments    Pt received in bed, incontinent of urine, nursing notified. Attempted sitting EOB with MaxA, however pt with poor tolerance once assisted into position due to fear of falling/dementia. Pt resisted further sitting and was assisted back to supine, pillows to promote skin integrity, bed alarm on, call bell in reach. Continue to recommend SNF once medically cleared.    Recommendations for follow up therapy are one component of a multi-disciplinary discharge planning process, led by the attending physician.  Recommendations may be updated based on patient status, additional functional criteria and insurance authorization.  Follow Up Recommendations  Skilled nursing-short term rehab (<3 hours/day) Can patient physically be transported by private vehicle: No   Assistance Recommended at Discharge Frequent or constant Supervision/Assistance  Patient can return home with the following Two people to help with walking and/or transfers;Two people to help with bathing/dressing/bathroom;Assist for transportation;Help with stairs or ramp for entrance   Equipment Recommendations  Other (comment) (TBD at next venue)    Recommendations for Other Services       Precautions / Restrictions Precautions Precautions: Fall Restrictions Weight Bearing Restrictions: No     Mobility  Bed  Mobility Overal bed mobility: Needs Assistance Bed Mobility: Supine to Sit, Sit to Supine Rolling: Mod assist   Supine to sit: Max assist Sit to supine: Max assist   General bed mobility comments: increased assist needed due to weakness and pt resistant to mobility    Transfers                   General transfer comment: Pt refused, fear of falling    Ambulation/Gait               General Gait Details: unable   Stairs             Wheelchair Mobility    Modified Rankin (Stroke Patients Only)       Balance                                            Cognition Arousal/Alertness: Awake/alert Behavior During Therapy: WFL for tasks assessed/performed Overall Cognitive Status: No family/caregiver present to determine baseline cognitive functioning                                 General Comments: Pt with decreased po intake, did not eat breakfast.        Exercises Total Joint Exercises Ankle Circles/Pumps: AROM, Strengthening, Both, 10 reps Hip ABduction/ADduction: AAROM, Strengthening, Both, 10 reps Knee Flexion: AAROM, Strengthening, Both, 10 reps    General Comments        Pertinent Vitals/Pain Pain Assessment Pain Assessment: Faces Faces Pain Scale: Hurts little more Pain Location: back Pain Descriptors / Indicators: Aching,  Discomfort Pain Intervention(s): Limited activity within patient's tolerance, Repositioned    Home Living                          Prior Function            PT Goals (current goals can now be found in the care plan section) Acute Rehab PT Goals Patient Stated Goal: to get out of the hospital    Frequency    Min 2X/week      PT Plan Current plan remains appropriate    Co-evaluation              AM-PAC PT "6 Clicks" Mobility   Outcome Measure  Help needed turning from your back to your side while in a flat bed without using bedrails?: A Lot Help  needed moving from lying on your back to sitting on the side of a flat bed without using bedrails?: A Lot Help needed moving to and from a bed to a chair (including a wheelchair)?: Total Help needed standing up from a chair using your arms (e.g., wheelchair or bedside chair)?: A Lot Help needed to walk in hospital room?: Total Help needed climbing 3-5 steps with a railing? : Total 6 Click Score: 9    End of Session   Activity Tolerance: Patient limited by fatigue;Patient limited by pain;Other (comment) (and fear of falling) Patient left: in bed;with call bell/phone within reach;with bed alarm set Nurse Communication: Mobility status PT Visit Diagnosis: Muscle weakness (generalized) (M62.81);Difficulty in walking, not elsewhere classified (R26.2);Repeated falls (R29.6)     Time: 4496-7591 PT Time Calculation (min) (ACUTE ONLY): 24 min  Charges:  $Therapeutic Exercise: 8-22 mins $Therapeutic Activity: 8-22 mins                    Mikel Cella, PTA    Josie Dixon 01/25/2022, 12:11 PM

## 2022-01-25 NOTE — Progress Notes (Signed)
Patient is alert to self only. Her mentation seems to be declining. She became very restless last night and began to pull off her clothes constantly and try to get out of bed. Would not follow directions to stay in bed and began talking about taking a trip. Was alerted several times by her bed alarm that she was getting out of bed. Several times she had pulled off purewick and attempted to climb out of bed. Gave sleep aid to assist with relaxation. Patient's bed alarm went off again and I went into room to check on patient. She began to grab at back and state that she was hurting. Gave her pain med. Techs gave patient a bath and she finally fell asleep until this morning around 00400 when her bed alarm went off again. Changed patient's bed, placed sacral foam and changed gown. Patient denied additional needs.

## 2022-01-25 NOTE — Progress Notes (Signed)
Occupational Therapy Treatment Patient Details Name: Jane Perez MRN: [id number] DOB: Jan 13, 1931 Today's Date: 01/25/2022   History of present illness Jane Perez is an 88 y.o. 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 Admitted  on 01/16/2022 with fall and found to be COVID + with PNA. Has frequent falls as demonstrated by multiple healed fractures on admission imaging including remote T2-4 and L1 compression fractures.   OT comments  Jane Perez was seen for OT treatment on this date. Upon arrival to room pt reclined in bed, pleasantly confused and agreeable to tx. Pt requires MOD A for bed mobility, poor sitting balance and fearful of falling. Tolerates ~15 min sitting. MOD A self-feeding seated EOB - assist for sitting balance. MAX A for LB access at bed level. Pt making good progress toward goals, will continue to follow POC. Discharge recommendation remains appropriate.     Recommendations for follow up therapy are one component of a multi-disciplinary discharge planning process, led by the attending physician.  Recommendations may be updated based on patient status, additional functional criteria and insurance authorization.    Follow Up Recommendations  Skilled nursing-short term rehab (<3 hours/day)     Assistance Recommended at Discharge Frequent or constant Supervision/Assistance  Patient can return home with the following  Two people to help with walking and/or transfers;Two people to help with bathing/dressing/bathroom   Equipment Recommendations  Other (comment) (defer)    Recommendations for Other Services      Precautions / Restrictions Precautions Precautions: Fall Restrictions Weight Bearing Restrictions: No       Mobility Bed Mobility Overal bed mobility: Needs Assistance Bed  Mobility: Supine to Sit, Sit to Supine     Supine to sit: Mod assist Sit to supine: Max assist        Transfers                   General transfer comment: Pt refused, fear of falling     Balance Overall balance assessment: History of Falls, Needs assistance Sitting-balance support: Single extremity supported, Feet supported Sitting balance-Leahy Scale: Fair                                     ADL either performed or assessed with clinical judgement   ADL Overall ADL's : Needs assistance/impaired                                       General ADL Comments: MOD A self-feeding seated EOB - assist for sitting balance. MAX A for LB access at bed level      Cognition Arousal/Alertness: Awake/alert Behavior During Therapy: WFL for tasks assessed/performed Overall Cognitive Status: No family/caregiver present to determine baseline cognitive functioning                                 General Comments: Repeats phrases and poor insight into situation                   Pertinent Vitals/ Pain       Pain Assessment Pain Assessment: Faces Faces Pain Scale: Hurts little  more Pain Location: back Pain Descriptors / Indicators: Aching, Discomfort Pain Intervention(s): Limited activity within patient's tolerance   Frequency  Min 2X/week        Progress Toward Goals  OT Goals(current goals can now be found in the care plan section)  Progress towards OT goals: Progressing toward goals  Acute Rehab OT Goals Patient Stated Goal: to get up OT Goal Formulation: With patient Time For Goal Achievement: 02/04/22 Potential to Achieve Goals: Fair ADL Goals Pt Will Perform Grooming: with set-up;with supervision;sitting Pt Will Perform Lower Body Dressing: with min assist;sitting/lateral leans Pt Will Transfer to Toilet: with mod assist;squat pivot transfer;bedside commode  Plan Discharge plan remains appropriate;Frequency  remains appropriate    Co-evaluation                 AM-PAC OT "6 Clicks" Daily Activity     Outcome Measure   Help from another person eating meals?: A Little Help from another person taking care of personal grooming?: A Little Help from another person toileting, which includes using toliet, bedpan, or urinal?: A Lot Help from another person bathing (including washing, rinsing, drying)?: A Lot Help from another person to put on and taking off regular upper body clothing?: A Little Help from another person to put on and taking off regular lower body clothing?: A Lot 6 Click Score: 15    End of Session    OT Visit Diagnosis: Other abnormalities of gait and mobility (R26.89);Muscle weakness (generalized) (M62.81)   Activity Tolerance Patient tolerated treatment well   Patient Left in bed;with call bell/phone within reach;with bed alarm set   Nurse Communication          Time: 1034-1100 OT Time Calculation (min): 26 min  Charges: OT General Charges $OT Visit: 1 Visit OT Treatments $Self Care/Home Management : 8-22 mins $Therapeutic Activity: 8-22 mins  Dessie Coma, M.S. OTR/L  01/25/22, 12:22 PM  ascom [phone]

## 2022-01-26 NOTE — Progress Notes (Addendum)
PROGRESS NOTE    Jane Perez   JME:[id number] DOB: [birth year]  DOA: 01/16/2022 Date of Service: 01/26/22 PCP: Tracie Harrier, MD     Brief Narrative / Hospital Course:  Jane Perez is a 88 y.o. 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 She presented from SNF to the ED on 01/16/2022 with fall. Has frequent falls as demonstrated by multiple healed fractures on admission imaging. 11/08:In the ED, it was found that they had BP 101/59 with otherwise normal vitals. Labs with troponin of 10, BNP 148.  Hemoglobin at baseline at 11.9.  Creatinine near baseline at 1.06.  COVID-positive, urinalysis with moderate leukocyte esterase and many bacteria.  Significant findings included CT head and C-spine T-spine L-spine showing remote compression deformities of T2-T3-T4 and L1 CTA chest negative for PE and showing patchy airspace opacities in the left lower lobe suspicious for pneumonia among other findings including remote rib and clavicle fractures. They were initially treated with paxlovid, steroids and supplemental oxygen. Patient was admitted to medicine service for further workup and management of covid 19  11/14 - 11/18: stable, improving and weaned to room air. Barrier to discharge is SNF acceptance policy s/p COVID. Will not be able to discharge until 11/19 at earliest, will need bed choice as well as Ins approva   Consultants:  none  Procedures: none      ASSESSMENT & PLAN:   Principal Problem:   Fall Active Problems:   Pneumonia due to COVID-19 virus   Hypoxia   Urinary tract infection   History of bladder cancer   History of DVT (deep vein thrombosis)   Pneumonia   Acute respiratory failure with hypoxia (HCC)   Closed nondisplaced fracture of right clavicle   COVID-19   Viral pneumonia   Acute  sepsis ([place])   Fall resulting in no significant injuries.  History of frequent falls Multifactorial in setting of acute illness, hypotension, bradycardia.  - Fall precautions and PT eval/treatment --> SNF placement pending  - ortho curbside on clavicle fracture and does not recommend intervention. Likely subacute given patient not complaining of pain   Hypotension- stable - stopped IV maintenance fluids - hold home BP meds   Pneumonia due to COVID-19 virus- improving/resolved  Hypoxic to 89% on presentation, now stable on room air. Procalcitonin normal. Less likely to have secondary bacterial pna.  Blood cultures negative.  - antivirals completed. - Oral dexamethasone completed - Incentive spirometer and supportive care    History of DVT (deep vein thrombosis) - Continue Eliquis   History of bladder cancer History of lung cancer - Followed by [facility] hospice/palliative care   Urinary tract infection described on admission- she was asymptomatic and urinalysis not consistent with infection. Had Klebsiella UTI 12/09/2021. Received initial dose of rocephin in ED. Urine cx positive for multiple species including proteus mirabilis and enterococcus faecalis. She remains asymptomatic and stable. Will continue to monitor off Abx - follow urine output   Dementia- very pleasant. Mildly confused. Oriented to self and place - OOB as much as possible     DVT prophylaxis: eliquis  Pertinent IV fluids/nutrition: none Central lines / invasive devices: none  Code Status: FULL CODE  Family Communication: none at this time  Disposition: inpatient, plan d/c SNF TOC needs: none at this time will check back tomorrow  Barriers to discharge / significant pending  items: SNF protocol for COVID(+) pts precludes d/c              Subjective:  Patient resting comfortably, no concners from RN        Objective:  Vitals:   01/25/22 0809 01/25/22 1545 01/25/22 2347 01/26/22 0905   BP: 107/64 125/63 (!) 114/94 109/60  Pulse: 97 80 95 84  Resp: 16 16 20 16   Temp: 98 F (36.7 C) (!) 97.1 F (36.2 C) 98.6 F (37 C) 98.5 F (36.9 C)  TempSrc:   Oral   SpO2: (!) 87% 95% 94% 95%  Weight:      Height:        Intake/Output Summary (Last 24 hours) at 01/26/2022 1353 Last data filed at 01/26/2022 0545 Gross per 24 hour  Intake 60 ml  Output 675 ml  Net -615 ml   Filed Weights   01/17/22 0418  Weight: 72.6 kg    Examination: Constitutional:  VS as above General Appearance:  NAD Respiratory: Normal respiratory effort       Scheduled Medications:   albuterol  2 puff Inhalation Q6H   apixaban  5 mg Oral BID   feeding supplement  237 mL Oral TID BM   gabapentin  100 mg Oral QHS   multivitamin with minerals  1 tablet Oral Daily   polyethylene glycol  17 g Oral Daily   traZODone  50 mg Oral QHS    Continuous Infusions:   PRN Medications:  acetaminophen, bisacodyl, chlorpheniramine-HYDROcodone, guaiFENesin-dextromethorphan, ondansetron **OR** ondansetron (ZOFRAN) IV, oxyCODONE, senna-docusate  Antimicrobials:  Anti-infectives (From admission, onward)    Start     Dose/Rate Route Frequency Ordered Stop   01/18/22 0200  cefTRIAXone (ROCEPHIN) 1 g in sodium chloride 0.9 % 100 mL IVPB  Status:  Discontinued        1 g 200 mL/hr over 30 Minutes Intravenous Every 24 hours 01/17/22 0215 01/17/22 1227   01/17/22 2200  molnupiravir EUA (LAGEVRIO) capsule 800 mg        4 capsule Oral 2 times daily 01/17/22 1027 01/22/22 0945   01/17/22 0145  nirmatrelvir/ritonavir EUA (renal dosing) (PAXLOVID) 2 tablet  Status:  Discontinued        2 tablet Oral 2 times daily 01/17/22 0142 01/17/22 1027   01/17/22 0130  nirmatrelvir/ritonavir EUA (PAXLOVID) 3 tablet  Status:  Discontinued        3 tablet Oral 2 times daily 01/17/22 0118 01/17/22 0142   01/17/22 0130  cefTRIAXone (ROCEPHIN) 1 g in sodium chloride 0.9 % 100 mL IVPB        1 g 200 mL/hr over 30 Minutes  Intravenous  Once 01/17/22 0127 01/17/22 0241       Data Reviewed: I have personally reviewed following labs and imaging studies  CBC: Recent Labs  Lab 01/23/22 0453  WBC 8.2  HGB 13.3  HCT 40.5  MCV 95.1  PLT 756   Basic Metabolic Panel: Recent Labs  Lab 01/23/22 0453  NA 138  K 4.2  CL 108  CO2 27  GLUCOSE 93  BUN 25*  CREATININE 0.79  CALCIUM 8.7*   GFR: Estimated Creatinine Clearance: 45.5 mL/min (by C-G formula based on SCr of 0.79 mg/dL). Liver Function Tests: No results for input(s): "AST", "ALT", "ALKPHOS", "BILITOT", "PROT", "ALBUMIN" in the last 168 hours.  No results for input(s): "LIPASE", "AMYLASE" in the last 168 hours. No results for input(s): "AMMONIA" in the last 168 hours. Coagulation Profile: No results for input(s): "INR", "PROTIME"  in the last 168 hours. Cardiac Enzymes: No results for input(s): "CKTOTAL", "CKMB", "CKMBINDEX", "TROPONINI" in the last 168 hours. BNP (last 3 results) No results for input(s): "PROBNP" in the last 8760 hours. HbA1C: No results for input(s): "HGBA1C" in the last 72 hours. CBG: No results for input(s): "GLUCAP" in the last 168 hours. Lipid Profile: No results for input(s): "CHOL", "HDL", "LDLCALC", "TRIG", "CHOLHDL", "LDLDIRECT" in the last 72 hours. Thyroid Function Tests: No results for input(s): "TSH", "T4TOTAL", "FREET4", "T3FREE", "THYROIDAB" in the last 72 hours. Anemia Panel: No results for input(s): "VITAMINB12", "FOLATE", "FERRITIN", "TIBC", "IRON", "RETICCTPCT" in the last 72 hours. Urine analysis:    Component Value Date/Time   COLORURINE AMBER (A) 01/16/2022 2355   APPEARANCEUR CLOUDY (A) 01/16/2022 2355   APPEARANCEUR Clear 04/11/2015 1339   LABSPEC 1.011 01/16/2022 2355   PHURINE 8.0 01/16/2022 2355   GLUCOSEU NEGATIVE 01/16/2022 2355   HGBUR NEGATIVE 01/16/2022 2355   BILIRUBINUR NEGATIVE 01/16/2022 2355   BILIRUBINUR Negative 04/11/2015 1339   KETONESUR NEGATIVE 01/16/2022 2355    PROTEINUR 30 (A) 01/16/2022 2355   NITRITE NEGATIVE 01/16/2022 2355   LEUKOCYTESUR MODERATE (A) 01/16/2022 2355   Sepsis Labs: @LABRCNTIP (procalcitonin:4,lacticidven:4)  Recent Results (from the past 240 hour(s))  Resp Panel by RT-PCR (Flu A&B, Covid) Anterior Nasal Swab     Status: Abnormal   Collection Time: 01/16/22 10:33 PM   Specimen: Anterior Nasal Swab  Result Value Ref Range Status   SARS Coronavirus 2 by RT PCR POSITIVE (A) NEGATIVE Final    Comment: (NOTE) SARS-CoV-2 target nucleic acids are DETECTED.  The SARS-CoV-2 RNA is generally detectable in upper respiratory specimens during the acute phase of infection. Positive results are indicative of the presence of the identified virus, but do not rule out bacterial infection or co-infection with other pathogens not detected by the test. Clinical correlation with patient history and other diagnostic information is necessary to determine patient infection status. The expected result is Negative.  Fact Sheet for Patients: EntrepreneurPulse.com.au  Fact Sheet for Healthcare Providers: IncredibleEmployment.be  This test is not yet approved or cleared by the [place] FDA and  has been authorized for detection and/or diagnosis of SARS-CoV-2 by FDA under an Emergency Use Authorization (EUA).  This EUA will remain in effect (meaning this test can be used) for the duration of  the COVID-19 declaration under Section 564(b)(1) of the A ct, 21 U.S.C. section 360bbb-3(b)(1), unless the authorization is terminated or revoked sooner.     Influenza A by PCR NEGATIVE NEGATIVE Final   Influenza B by PCR NEGATIVE NEGATIVE Final    Comment: (NOTE) The Xpert Xpress SARS-CoV-2/FLU/RSV plus assay is intended as an aid in the diagnosis of influenza from Nasopharyngeal swab specimens and should not be used as a sole basis for treatment. Nasal washings and aspirates are unacceptable for Xpert Xpress  SARS-CoV-2/FLU/RSV testing.  Fact Sheet for Patients: EntrepreneurPulse.com.au  Fact Sheet for Healthcare Providers: IncredibleEmployment.be  This test is not yet approved or cleared by the [place] FDA and has been authorized for detection and/or diagnosis of SARS-CoV-2 by FDA under an Emergency Use Authorization (EUA). This EUA will remain in effect (meaning this test can be used) for the duration of the COVID-19 declaration under Section 564(b)(1) of the Act, 21 U.S.C. section 360bbb-3(b)(1), unless the authorization is terminated or revoked.  Performed at [facility], [address] [place],  [address]   Urine Culture     Status: Abnormal   Collection Time: 01/16/22 11:55  PM   Specimen: Urine, Clean Catch  Result Value Ref Range Status   Specimen Description   Final    URINE, CLEAN CATCH Performed at [facility], [address] [place], [place] [address]    Special Requests   Final    NONE Performed at [facility], [address] [place], [place] [address]    Culture (A)  Final    >=100,000 COLONIES/mL PROTEUS MIRABILIS 80,000 COLONIES/mL ENTEROCOCCUS FAECALIS    Report Status 01/19/2022 FINAL  Final   Organism ID, Bacteria PROTEUS MIRABILIS (A)  Final   Organism ID, Bacteria ENTEROCOCCUS FAECALIS (A)  Final      Susceptibility   Enterococcus faecalis - MIC*    AMPICILLIN <=2 SENSITIVE Sensitive     NITROFURANTOIN <=16 SENSITIVE Sensitive     VANCOMYCIN 1 SENSITIVE Sensitive     * 80,000 COLONIES/mL ENTEROCOCCUS FAECALIS   Proteus mirabilis - MIC*    AMPICILLIN <=2 SENSITIVE Sensitive     CEFAZOLIN <=4 SENSITIVE Sensitive     CEFEPIME <=0.12 SENSITIVE Sensitive     CEFTRIAXONE <=0.25 SENSITIVE Sensitive     CIPROFLOXACIN <=0.25 SENSITIVE Sensitive     GENTAMICIN <=1 SENSITIVE Sensitive     IMIPENEM 1 SENSITIVE Sensitive     NITROFURANTOIN 256 RESISTANT Resistant      TRIMETH/SULFA <=20 SENSITIVE Sensitive     AMPICILLIN/SULBACTAM <=2 SENSITIVE Sensitive     PIP/TAZO <=4 SENSITIVE Sensitive     * >=100,000 COLONIES/mL PROTEUS MIRABILIS  Blood culture (routine x 2)     Status: None   Collection Time: 01/17/22  3:38 AM   Specimen: BLOOD  Result Value Ref Range Status   Specimen Description BLOOD LEFT HAND  Final   Special Requests   Final    BOTTLES DRAWN AEROBIC AND ANAEROBIC Blood Culture results may not be optimal due to an inadequate volume of blood received in culture bottles   Culture   Final    NO GROWTH 5 DAYS Performed at [facility], [address] [place], [place] [address]    Report Status 01/22/2022 FINAL  Final  Blood culture (routine x 2)     Status: None   Collection Time: 01/17/22  3:39 AM   Specimen: BLOOD  Result Value Ref Range Status   Specimen Description BLOOD RIGHT [facility]  Final   Special Requests   Final    BOTTLES DRAWN AEROBIC AND ANAEROBIC Blood Culture adequate volume   Culture   Final    NO GROWTH 5 DAYS Performed at [facility], [address] [place],  [address]    Report Status 01/22/2022 FINAL  Final         Radiology Studies: CT T-SPINE NO CHARGE  Result Date: 01/17/2022 CLINICAL DATA:  Frequent falls, found down EXAM: CT THORACIC AND LUMBAR SPINE WITHOUT CONTRAST TECHNIQUE: Multidetector CT imaging of the thoracic and lumbar spine was performed without contrast. Multiplanar CT image reconstructions were also generated. RADIATION DOSE REDUCTION: This exam was performed according to the departmental dose-optimization program which includes automated exposure control, adjustment of the mA and/or kV according to patient size and/or use of iterative reconstruction technique. COMPARISON:  09/02/2021 CT T and L-spine; correlation is also made with CT cervical spine 12/27/2021 FINDINGS: CT THORACIC SPINE FINDINGS Alignment: No traumatic listhesis. Exaggeration of the normal thoracic  kyphosis. Scoliosis. Vertebrae: Osteopenia. Unchanged remote compression deformities T3 and T4. 10% vertebral body height loss and increased density of the superior aspect of T2, which appears  unchanged compared to the 12/27/2021 CT cervical spine. Unchanged remote T2 spinous process fracture (series 9, image 56) no definite acute fracture. Paraspinal and other soft tissues: Please see same-day CT chest Disc levels: Multilevel degenerative disc disease without significant spinal canal stenosis. CT LUMBAR SPINE FINDINGS Segmentation: 5 lumbar-type vertebral bodies. Alignment: Exaggeration of the normal lumbar lordosis. Lumbar levoscoliosis. 3 mm anterolisthesis of T12 on L1. 3 mm retrolisthesis of L1 on L2. 3 mm anterolisthesis of L3 on L4, L4 on L5, and L5 on S1. Vertebrae: No acute fracture or suspicious osseous lesion. Redemonstrated remote L1 compression fracture. Vertebral body heights are otherwise preserved. Paraspinal and other soft tissues: Aortic atherosclerosis. Disc levels: T12-L1: Disc height loss and grade 1 anterolisthesis. No significant spinal canal stenosis or neural foraminal narrowing. L1-L2: Disc height loss and moderate disc bulge with annular calcification. Mild facet arthropathy. Moderate spinal canal stenosis and severe right and moderate left neural foraminal narrowing. L2-L3: Mild disc bulge. Mild facet arthropathy. Ligamentum flavum hypertrophy. Moderate spinal canal stenosis. No neural foraminal narrowing. L3-L4: Trace anterolisthesis disc unroofing and moderate disc bulge. Severe facet arthropathy. Ligamentum flavum hypertrophy. Severe spinal canal stenosis. Severe left neural foraminal narrowing. L4-L5: Anterolisthesis and disc unroofing with moderate right eccentric disc bulge. Severe facet arthropathy. Ligamentum flavum hypertrophy. Mild spinal canal stenosis. No neural foraminal narrowing. L5-S1: Anterolisthesis with disc unroofing. Severe facet arthropathy. Severe spinal canal  stenosis. Moderate left neural foraminal narrowing. IMPRESSION: 1. No acute fracture or traumatic listhesis in the thoracic or lumbar spine. 2. Unchanged remote compression deformities of T2, T3, T4, and L1. 3. Multilevel degenerative disc disease and spondylolisthesis, as described above. 4. Aortic atherosclerosis. Aortic Atherosclerosis (ICD10-I70.0). Electronically Signed   By: Merilyn Baba M.D.   On: 01/17/2022 01:56   CT Lumbar Spine Wo Contrast  Result Date: 01/17/2022 CLINICAL DATA:  Frequent falls, found down EXAM: CT THORACIC AND LUMBAR SPINE WITHOUT CONTRAST TECHNIQUE: Multidetector CT imaging of the thoracic and lumbar spine was performed without contrast. Multiplanar CT image reconstructions were also generated. RADIATION DOSE REDUCTION: This exam was performed according to the departmental dose-optimization program which includes automated exposure control, adjustment of the mA and/or kV according to patient size and/or use of iterative reconstruction technique. COMPARISON:  09/02/2021 CT T and L-spine; correlation is also made with CT cervical spine 12/27/2021 FINDINGS: CT THORACIC SPINE FINDINGS Alignment: No traumatic listhesis. Exaggeration of the normal thoracic kyphosis. Scoliosis. Vertebrae: Osteopenia. Unchanged remote compression deformities T3 and T4. 10% vertebral body height loss and increased density of the superior aspect of T2, which appears unchanged compared to the 12/27/2021 CT cervical spine. Unchanged remote T2 spinous process fracture (series 9, image 56) no definite acute fracture. Paraspinal and other soft tissues: Please see same-day CT chest Disc levels: Multilevel degenerative disc disease without significant spinal canal stenosis. CT LUMBAR SPINE FINDINGS Segmentation: 5 lumbar-type vertebral bodies. Alignment: Exaggeration of the normal lumbar lordosis. Lumbar levoscoliosis. 3 mm anterolisthesis of T12 on L1. 3 mm retrolisthesis of L1 on L2. 3 mm anterolisthesis of L3 on  L4, L4 on L5, and L5 on S1. Vertebrae: No acute fracture or suspicious osseous lesion. Redemonstrated remote L1 compression fracture. Vertebral body heights are otherwise preserved. Paraspinal and other soft tissues: Aortic atherosclerosis. Disc levels: T12-L1: Disc height loss and grade 1 anterolisthesis. No significant spinal canal stenosis or neural foraminal narrowing. L1-L2: Disc height loss and moderate disc bulge with annular calcification. Mild facet arthropathy. Moderate spinal canal stenosis and severe right and moderate left neural foraminal  narrowing. L2-L3: Mild disc bulge. Mild facet arthropathy. Ligamentum flavum hypertrophy. Moderate spinal canal stenosis. No neural foraminal narrowing. L3-L4: Trace anterolisthesis disc unroofing and moderate disc bulge. Severe facet arthropathy. Ligamentum flavum hypertrophy. Severe spinal canal stenosis. Severe left neural foraminal narrowing. L4-L5: Anterolisthesis and disc unroofing with moderate right eccentric disc bulge. Severe facet arthropathy. Ligamentum flavum hypertrophy. Mild spinal canal stenosis. No neural foraminal narrowing. L5-S1: Anterolisthesis with disc unroofing. Severe facet arthropathy. Severe spinal canal stenosis. Moderate left neural foraminal narrowing. IMPRESSION: 1. No acute fracture or traumatic listhesis in the thoracic or lumbar spine. 2. Unchanged remote compression deformities of T2, T3, T4, and L1. 3. Multilevel degenerative disc disease and spondylolisthesis, as described above. 4. Aortic atherosclerosis. Aortic Atherosclerosis (ICD10-I70.0). Electronically Signed   By: Merilyn Baba M.D.   On: 01/17/2022 01:56   CT Angio Chest PE W and/or Wo Contrast  Result Date: 01/17/2022 CLINICAL DATA:  88 year old female with history of DVT and PE on anticoagulation; found down at her facility EXAM: CT ANGIOGRAPHY CHEST WITH CONTRAST TECHNIQUE: Multidetector CT imaging of the chest was performed using the standard protocol during bolus  administration of intravenous contrast. Multiplanar CT image reconstructions and MIPs were obtained to evaluate the vascular anatomy. RADIATION DOSE REDUCTION: This exam was performed according to the departmental dose-optimization program which includes automated exposure control, adjustment of the mA and/or kV according to patient size and/or use of iterative reconstruction technique. CONTRAST:  80mL OMNIPAQUE IOHEXOL 350 MG/ML SOLN COMPARISON:  Radiographs 01/16/2022 and CT chest 11/11/2012. FINDINGS: Cardiovascular: Satisfactory opacification of the pulmonary arteries to the segmental level. No evidence of pulmonary embolism. Cardiomegaly. No pericardial effusion. Coronary artery and aortic atherosclerotic calcification. Mediastinum/Nodes: No enlarged mediastinal, hilar, or axillary lymph nodes. Thyroid gland, trachea, and esophagus demonstrate no significant findings. Lungs/Pleura: Respiratory motion obscures fine detail. Mild diffuse interlobular septal thickening. Bronchial wall thickening and mucous plugging in the left-greater-than-right lower lobes. Atelectasis or pneumonia in the left lower lobe and lingula. Upper Abdomen: No acute abnormality. Bilateral adrenal thickening which is slightly nodular on the left slightly increased from 2014 but likely representing benign hyperplasia/adenoma. No follow-up is recommended. Cholecystectomy. Musculoskeletal: Age indeterminate fracture of the proximal right clavicle which is minimally displaced as a acute to subacute appearance. Numerous subacute and chronic bilateral rib fractures. Marked compression fracture of T4. See separate report from CT of the thoracic spine for details. Review of the MIP images confirms the above findings. IMPRESSION: 1. Negative for acute pulmonary embolism. 2. Patchy airspace opacities bilaterally greatest in the left lower lobe suspicious for pneumonia. 3. Cardiomegaly and interstitial thickening suggestive of pulmonary edema. 4.  Acute or subacute proximal right clavicle fracture. 5. Subacute and chronic bilateral rib fractures. 6. Age indeterminate compression fracture of T4. See separate report for findings in the thoracolumbar spine. Electronically Signed   By: Placido Sou M.D.   On: 01/17/2022 01:52   CT HEAD WO CONTRAST (5MM)  Result Date: 01/16/2022 CLINICAL DATA:  Head and neck trauma after fall on anticoagulation; bruising to the right forehead EXAM: CT HEAD WITHOUT CONTRAST TECHNIQUE: Contiguous axial images were obtained from the base of the skull through the vertex without intravenous contrast. RADIATION DOSE REDUCTION: This exam was performed according to the departmental dose-optimization program which includes automated exposure control, adjustment of the mA and/or kV according to patient size and/or use of iterative reconstruction technique. COMPARISON:  CT head and C-spine 12/27/2021 FINDINGS: CT HEAD FINDINGS Brain: No intracranial hemorrhage, mass effect, or evidence of acute infarct.  No hydrocephalus. No extra-axial fluid collection. Generalized cerebral atrophy. Ill-defined hypoattenuation within the cerebral white matter is nonspecific but consistent with chronic small vessel ischemic disease. Vascular: No hyperdense vessel. Intracranial arterial calcification. Skull: No fracture or focal lesion. Scalp hematoma over the right forehead. Sinuses/Orbits: Mucosal thickening with air-fluid levels in both maxillary sinuses. Mucosal thickening in the ethmoid air cells. Other: None. CT CERVICAL SPINE FINDINGS Alignment: Multiple low-grade vertebral body subluxations are similar to prior. Skull base and vertebrae: No acute fracture. Redemonstrated subacute superior endplate compression fractures and sclerosis of T2 and partially visualized T3. Minimally displaced fracture through the T2 spinous process with signs of sclerosis about the fracture line is also unchanged. Soft tissues and spinal canal: No prevertebral fluid  or swelling. No visible canal hematoma. Disc levels: Unchanged multilevel advanced spondylosis and facet arthropathy. Posterior disc osteophyte complexes cause multilevel moderate spinal canal narrowing. Uncovertebral spurring and facet arthropathy cause advanced neural foraminal narrowing bilaterally at C4-C5. Upper chest: Negative. Other: None. IMPRESSION: 1. No acute intracranial abnormality. Generalized atrophy and small vessel white matter disease. 2. No acute fracture in the cervical spine. Multilevel degenerative spondylosis. 3. Subacute compression fractures of T2 and T3 and mildly displaced subacute fracture of the T2 spinous process. 4. Right forehead scalp hematoma.  No calvarial fracture. 5. Mucosal thickening in the ethmoid and bilateral maxillary sinuses with air-fluid levels can be seen with acute sinusitis. Electronically Signed   By: Placido Sou M.D.   On: 01/16/2022 23:47   CT Cervical Spine Wo Contrast  Result Date: 01/16/2022 CLINICAL DATA:  Head and neck trauma after fall on anticoagulation; bruising to the right forehead EXAM: CT HEAD WITHOUT CONTRAST TECHNIQUE: Contiguous axial images were obtained from the base of the skull through the vertex without intravenous contrast. RADIATION DOSE REDUCTION: This exam was performed according to the departmental dose-optimization program which includes automated exposure control, adjustment of the mA and/or kV according to patient size and/or use of iterative reconstruction technique. COMPARISON:  CT head and C-spine 12/27/2021 FINDINGS: CT HEAD FINDINGS Brain: No intracranial hemorrhage, mass effect, or evidence of acute infarct. No hydrocephalus. No extra-axial fluid collection. Generalized cerebral atrophy. Ill-defined hypoattenuation within the cerebral white matter is nonspecific but consistent with chronic small vessel ischemic disease. Vascular: No hyperdense vessel. Intracranial arterial calcification. Skull: No fracture or focal lesion.  Scalp hematoma over the right forehead. Sinuses/Orbits: Mucosal thickening with air-fluid levels in both maxillary sinuses. Mucosal thickening in the ethmoid air cells. Other: None. CT CERVICAL SPINE FINDINGS Alignment: Multiple low-grade vertebral body subluxations are similar to prior. Skull base and vertebrae: No acute fracture. Redemonstrated subacute superior endplate compression fractures and sclerosis of T2 and partially visualized T3. Minimally displaced fracture through the T2 spinous process with signs of sclerosis about the fracture line is also unchanged. Soft tissues and spinal canal: No prevertebral fluid or swelling. No visible canal hematoma. Disc levels: Unchanged multilevel advanced spondylosis and facet arthropathy. Posterior disc osteophyte complexes cause multilevel moderate spinal canal narrowing. Uncovertebral spurring and facet arthropathy cause advanced neural foraminal narrowing bilaterally at C4-C5. Upper chest: Negative. Other: None. IMPRESSION: 1. No acute intracranial abnormality. Generalized atrophy and small vessel white matter disease. 2. No acute fracture in the cervical spine. Multilevel degenerative spondylosis. 3. Subacute compression fractures of T2 and T3 and mildly displaced subacute fracture of the T2 spinous process. 4. Right forehead scalp hematoma.  No calvarial fracture. 5. Mucosal thickening in the ethmoid and bilateral maxillary sinuses with air-fluid levels can be seen with  acute sinusitis. Electronically Signed   By: Placido Sou M.D.   On: 01/16/2022 23:47   DG Chest 1 View  Result Date: 01/16/2022 CLINICAL DATA:  Chest pain EXAM: CHEST  1 VIEW COMPARISON:  12/09/2021, CT 11/11/2012 FINDINGS: Mild cardiomegaly. Mild diffuse reticular interstitial opacity. Probable subsegmental atelectasis at the bases. No pneumothorax. Aortic atherosclerosis. Old right clavicle fracture. IMPRESSION: 1. Mild diffuse reticular interstitial opacity suggestive of underlying  chronic disease. There is probable subsegmental atelectasis at the bases 2. Cardiomegaly Electronically Signed   By: Donavan Foil M.D.   On: 01/16/2022 23:41            LOS: 9 days    Time spent: [place], DO [facility] 01/26/2022, 1:53 PM   Staff may message me via secure chat in [place]  but this may not receive immediate response,  please page for urgent matters!  If 7PM-7AM, please contact night-coverage [email or web address]  Dictation software was used to generate the above note. Typos may occur and escape review, as with typed/written notes. Please contact Dr Sheppard Coil directly for clarity if needed.

## 2022-01-26 NOTE — Plan of Care (Signed)
  Problem: Respiratory: Goal: Will maintain a patent airway Outcome: Progressing   Problem: Respiratory: Goal: Complications related to the disease process, condition or treatment will be avoided or minimized Outcome: Progressing   Problem: Activity: Goal: Risk for activity intolerance will decrease Outcome: Progressing

## 2022-01-27 NOTE — Progress Notes (Signed)
PROGRESS NOTE    THRESIA RAMANATHAN   HDQ:[id number] DOB: 1930-10-03  DOA: 01/16/2022 Date of Service: 01/27/22 PCP: Tracie Harrier, MD     Brief Narrative / Hospital Course:  Jane Perez is a 88 y.o. 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 She presented from SNF to the ED on 01/16/2022 with fall. Has frequent falls as demonstrated by multiple healed fractures on admission imaging. 11/08:In the ED, it was found that they had BP 101/59 with otherwise normal vitals. Labs with troponin of 10, BNP 148.  Hemoglobin at baseline at 11.9.  Creatinine near baseline at 1.06.  COVID-positive, urinalysis with moderate leukocyte esterase and many bacteria.  Significant findings included CT head and C-spine T-spine L-spine showing remote compression deformities of T2-T3-T4 and L1 CTA chest negative for PE and showing patchy airspace opacities in the left lower lobe suspicious for pneumonia among other findings including remote rib and clavicle fractures. They were initially treated with paxlovid, steroids and supplemental oxygen. Patient was admitted to medicine service for further workup and management of covid 19  11/14 - 11/18: stable, improving and weaned to room air. Barrier to discharge is SNF acceptance policy s/p COVID. Will not be able to discharge until 11/19 at earliest, will need bed choice as well as Ins approval  11/19 today is Sunday, will ask TOC to follow tomorrow 11/20  Consultants:  none  Procedures: none      ASSESSMENT & PLAN:   Principal Problem:   Fall Active Problems:   Pneumonia due to COVID-19 virus   Hypoxia   Urinary tract infection   History of bladder cancer   History of DVT (deep vein thrombosis)   Pneumonia   Acute respiratory failure with hypoxia (HCC)   Closed nondisplaced  fracture of right clavicle   COVID-19   Viral pneumonia   Acute sepsis ([place])   Fall resulting in no significant injuries.  History of frequent falls Multifactorial in setting of acute illness, hypotension, bradycardia.  - Fall precautions and PT eval/treatment --> SNF placement pending  - ortho curbside on clavicle fracture and does not recommend intervention. Likely subacute given patient not complaining of pain   Hypotension- stable - stopped IV maintenance fluids - hold home BP meds   Pneumonia due to COVID-19 virus- improving/resolved  Hypoxic to 89% on presentation, now stable on room air. Procalcitonin normal. Less likely to have secondary bacterial pna.  Blood cultures negative.  - antivirals completed. - Oral dexamethasone completed - Incentive spirometer and supportive care    History of DVT (deep vein thrombosis) - Continue Eliquis   History of bladder cancer History of lung cancer - Followed by [facility] hospice/palliative care   Urinary tract infection described on admission- she was asymptomatic and urinalysis not consistent with infection. Had Klebsiella UTI 12/09/2021. Received initial dose of rocephin in ED. Urine cx positive for multiple species including proteus mirabilis and enterococcus faecalis. She remains asymptomatic and stable. Will continue to monitor off Abx - follow urine output   Dementia- very pleasant. Mildly confused. Oriented to self and place - OOB as much as possible     DVT prophylaxis: eliquis  Pertinent IV fluids/nutrition: none Central lines / invasive devices: none  Code Status: FULL CODE  Family Communication: none at this time  Disposition: inpatient, plan d/c SNF TOC needs: none at this time  will check back tomorrow which is Monday 01/28/22 Barriers to discharge / significant pending items: SNF protocol for COVID(+) pts precludes d/c              Subjective:  Patient resting comfortably, no concners from RN         Objective:  Vitals:   01/26/22 1736 01/26/22 2055 01/27/22 0257 01/27/22 0857  BP: 115/64 115/64 120/64 113/70  Pulse: 86 86 86 84  Resp: 18 18 18    Temp: 98 F (36.7 C) 98.2 F (36.8 C) 97.7 F (36.5 C) 98.5 F (36.9 C)  TempSrc:      SpO2: 99% 98% 92% 91%  Weight:      Height:        Intake/Output Summary (Last 24 hours) at 01/27/2022 1453 Last data filed at 01/27/2022 0630 Gross per 24 hour  Intake 357 ml  Output 500 ml  Net -143 ml   Filed Weights   01/17/22 0418  Weight: 72.6 kg    Examination: Constitutional:  VS as above General Appearance:  NAD Respiratory: Normal respiratory effort       Scheduled Medications:   albuterol  2 puff Inhalation Q6H   apixaban  5 mg Oral BID   feeding supplement  237 mL Oral TID BM   gabapentin  100 mg Oral QHS   multivitamin with minerals  1 tablet Oral Daily   polyethylene glycol  17 g Oral Daily   traZODone  50 mg Oral QHS    Continuous Infusions:   PRN Medications:  acetaminophen, bisacodyl, chlorpheniramine-HYDROcodone, guaiFENesin-dextromethorphan, ondansetron **OR** ondansetron (ZOFRAN) IV, oxyCODONE, senna-docusate  Antimicrobials:  Anti-infectives (From admission, onward)    Start     Dose/Rate Route Frequency Ordered Stop   01/18/22 0200  cefTRIAXone (ROCEPHIN) 1 g in sodium chloride 0.9 % 100 mL IVPB  Status:  Discontinued        1 g 200 mL/hr over 30 Minutes Intravenous Every 24 hours 01/17/22 0215 01/17/22 1227   01/17/22 2200  molnupiravir EUA (LAGEVRIO) capsule 800 mg        4 capsule Oral 2 times daily 01/17/22 1027 01/22/22 0945   01/17/22 0145  nirmatrelvir/ritonavir EUA (renal dosing) (PAXLOVID) 2 tablet  Status:  Discontinued        2 tablet Oral 2 times daily 01/17/22 0142 01/17/22 1027   01/17/22 0130  nirmatrelvir/ritonavir EUA (PAXLOVID) 3 tablet  Status:  Discontinued        3 tablet Oral 2 times daily 01/17/22 0118 01/17/22 0142   01/17/22 0130  cefTRIAXone (ROCEPHIN) 1  g in sodium chloride 0.9 % 100 mL IVPB        1 g 200 mL/hr over 30 Minutes Intravenous  Once 01/17/22 0127 01/17/22 0241       Data Reviewed: I have personally reviewed following labs and imaging studies  CBC: Recent Labs  Lab 01/23/22 0453  WBC 8.2  HGB 13.3  HCT 40.5  MCV 95.1  PLT 706   Basic Metabolic Panel: Recent Labs  Lab 01/23/22 0453  NA 138  K 4.2  CL 108  CO2 27  GLUCOSE 93  BUN 25*  CREATININE 0.79  CALCIUM 8.7*   GFR: Estimated Creatinine Clearance: 45.5 mL/min (by C-G formula based on SCr of 0.79 mg/dL). Liver Function Tests: No results for input(s): "AST", "ALT", "ALKPHOS", "BILITOT", "PROT", "ALBUMIN" in the last 168 hours.  No results for input(s): "LIPASE", "AMYLASE" in the last 168 hours. No results for input(s): "AMMONIA" in the  last 168 hours. Coagulation Profile: No results for input(s): "INR", "PROTIME" in the last 168 hours. Cardiac Enzymes: No results for input(s): "CKTOTAL", "CKMB", "CKMBINDEX", "TROPONINI" in the last 168 hours. BNP (last 3 results) No results for input(s): "PROBNP" in the last 8760 hours. HbA1C: No results for input(s): "HGBA1C" in the last 72 hours. CBG: No results for input(s): "GLUCAP" in the last 168 hours. Lipid Profile: No results for input(s): "CHOL", "HDL", "LDLCALC", "TRIG", "CHOLHDL", "LDLDIRECT" in the last 72 hours. Thyroid Function Tests: No results for input(s): "TSH", "T4TOTAL", "FREET4", "T3FREE", "THYROIDAB" in the last 72 hours. Anemia Panel: No results for input(s): "VITAMINB12", "FOLATE", "FERRITIN", "TIBC", "IRON", "RETICCTPCT" in the last 72 hours. Urine analysis:    Component Value Date/Time   COLORURINE AMBER (A) 01/16/2022 2355   APPEARANCEUR CLOUDY (A) 01/16/2022 2355   APPEARANCEUR Clear 04/11/2015 1339   LABSPEC 1.011 01/16/2022 2355   PHURINE 8.0 01/16/2022 2355   GLUCOSEU NEGATIVE 01/16/2022 2355   HGBUR NEGATIVE 01/16/2022 2355   BILIRUBINUR NEGATIVE 01/16/2022 2355    BILIRUBINUR Negative 04/11/2015 1339   KETONESUR NEGATIVE 01/16/2022 2355   PROTEINUR 30 (A) 01/16/2022 2355   NITRITE NEGATIVE 01/16/2022 2355   LEUKOCYTESUR MODERATE (A) 01/16/2022 2355   Sepsis Labs: @LABRCNTIP (procalcitonin:4,lacticidven:4)  No results found for this or any previous visit (from the past 240 hour(s)).        Radiology Studies: CT T-SPINE NO CHARGE  Result Date: 01/17/2022 CLINICAL DATA:  Frequent falls, found down EXAM: CT THORACIC AND LUMBAR SPINE WITHOUT CONTRAST TECHNIQUE: Multidetector CT imaging of the thoracic and lumbar spine was performed without contrast. Multiplanar CT image reconstructions were also generated. RADIATION DOSE REDUCTION: This exam was performed according to the departmental dose-optimization program which includes automated exposure control, adjustment of the mA and/or kV according to patient size and/or use of iterative reconstruction technique. COMPARISON:  09/02/2021 CT T and L-spine; correlation is also made with CT cervical spine 12/27/2021 FINDINGS: CT THORACIC SPINE FINDINGS Alignment: No traumatic listhesis. Exaggeration of the normal thoracic kyphosis. Scoliosis. Vertebrae: Osteopenia. Unchanged remote compression deformities T3 and T4. 10% vertebral body height loss and increased density of the superior aspect of T2, which appears unchanged compared to the 12/27/2021 CT cervical spine. Unchanged remote T2 spinous process fracture (series 9, image 56) no definite acute fracture. Paraspinal and other soft tissues: Please see same-day CT chest Disc levels: Multilevel degenerative disc disease without significant spinal canal stenosis. CT LUMBAR SPINE FINDINGS Segmentation: 5 lumbar-type vertebral bodies. Alignment: Exaggeration of the normal lumbar lordosis. Lumbar levoscoliosis. 3 mm anterolisthesis of T12 on L1. 3 mm retrolisthesis of L1 on L2. 3 mm anterolisthesis of L3 on L4, L4 on L5, and L5 on S1. Vertebrae: No acute fracture or suspicious  osseous lesion. Redemonstrated remote L1 compression fracture. Vertebral body heights are otherwise preserved. Paraspinal and other soft tissues: Aortic atherosclerosis. Disc levels: T12-L1: Disc height loss and grade 1 anterolisthesis. No significant spinal canal stenosis or neural foraminal narrowing. L1-L2: Disc height loss and moderate disc bulge with annular calcification. Mild facet arthropathy. Moderate spinal canal stenosis and severe right and moderate left neural foraminal narrowing. L2-L3: Mild disc bulge. Mild facet arthropathy. Ligamentum flavum hypertrophy. Moderate spinal canal stenosis. No neural foraminal narrowing. L3-L4: Trace anterolisthesis disc unroofing and moderate disc bulge. Severe facet arthropathy. Ligamentum flavum hypertrophy. Severe spinal canal stenosis. Severe left neural foraminal narrowing. L4-L5: Anterolisthesis and disc unroofing with moderate right eccentric disc bulge. Severe facet arthropathy. Ligamentum flavum hypertrophy. Mild spinal canal stenosis. No neural  foraminal narrowing. L5-S1: Anterolisthesis with disc unroofing. Severe facet arthropathy. Severe spinal canal stenosis. Moderate left neural foraminal narrowing. IMPRESSION: 1. No acute fracture or traumatic listhesis in the thoracic or lumbar spine. 2. Unchanged remote compression deformities of T2, T3, T4, and L1. 3. Multilevel degenerative disc disease and spondylolisthesis, as described above. 4. Aortic atherosclerosis. Aortic Atherosclerosis (ICD10-I70.0). Electronically Signed   By: Merilyn Baba M.D.   On: 01/17/2022 01:56   CT Lumbar Spine Wo Contrast  Result Date: 01/17/2022 CLINICAL DATA:  Frequent falls, found down EXAM: CT THORACIC AND LUMBAR SPINE WITHOUT CONTRAST TECHNIQUE: Multidetector CT imaging of the thoracic and lumbar spine was performed without contrast. Multiplanar CT image reconstructions were also generated. RADIATION DOSE REDUCTION: This exam was performed according to the departmental  dose-optimization program which includes automated exposure control, adjustment of the mA and/or kV according to patient size and/or use of iterative reconstruction technique. COMPARISON:  09/02/2021 CT T and L-spine; correlation is also made with CT cervical spine 12/27/2021 FINDINGS: CT THORACIC SPINE FINDINGS Alignment: No traumatic listhesis. Exaggeration of the normal thoracic kyphosis. Scoliosis. Vertebrae: Osteopenia. Unchanged remote compression deformities T3 and T4. 10% vertebral body height loss and increased density of the superior aspect of T2, which appears unchanged compared to the 12/27/2021 CT cervical spine. Unchanged remote T2 spinous process fracture (series 9, image 56) no definite acute fracture. Paraspinal and other soft tissues: Please see same-day CT chest Disc levels: Multilevel degenerative disc disease without significant spinal canal stenosis. CT LUMBAR SPINE FINDINGS Segmentation: 5 lumbar-type vertebral bodies. Alignment: Exaggeration of the normal lumbar lordosis. Lumbar levoscoliosis. 3 mm anterolisthesis of T12 on L1. 3 mm retrolisthesis of L1 on L2. 3 mm anterolisthesis of L3 on L4, L4 on L5, and L5 on S1. Vertebrae: No acute fracture or suspicious osseous lesion. Redemonstrated remote L1 compression fracture. Vertebral body heights are otherwise preserved. Paraspinal and other soft tissues: Aortic atherosclerosis. Disc levels: T12-L1: Disc height loss and grade 1 anterolisthesis. No significant spinal canal stenosis or neural foraminal narrowing. L1-L2: Disc height loss and moderate disc bulge with annular calcification. Mild facet arthropathy. Moderate spinal canal stenosis and severe right and moderate left neural foraminal narrowing. L2-L3: Mild disc bulge. Mild facet arthropathy. Ligamentum flavum hypertrophy. Moderate spinal canal stenosis. No neural foraminal narrowing. L3-L4: Trace anterolisthesis disc unroofing and moderate disc bulge. Severe facet arthropathy. Ligamentum  flavum hypertrophy. Severe spinal canal stenosis. Severe left neural foraminal narrowing. L4-L5: Anterolisthesis and disc unroofing with moderate right eccentric disc bulge. Severe facet arthropathy. Ligamentum flavum hypertrophy. Mild spinal canal stenosis. No neural foraminal narrowing. L5-S1: Anterolisthesis with disc unroofing. Severe facet arthropathy. Severe spinal canal stenosis. Moderate left neural foraminal narrowing. IMPRESSION: 1. No acute fracture or traumatic listhesis in the thoracic or lumbar spine. 2. Unchanged remote compression deformities of T2, T3, T4, and L1. 3. Multilevel degenerative disc disease and spondylolisthesis, as described above. 4. Aortic atherosclerosis. Aortic Atherosclerosis (ICD10-I70.0). Electronically Signed   By: Merilyn Baba M.D.   On: 01/17/2022 01:56   CT Angio Chest PE W and/or Wo Contrast  Result Date: 01/17/2022 CLINICAL DATA:  88 year old female with history of DVT and PE on anticoagulation; found down at her facility EXAM: CT ANGIOGRAPHY CHEST WITH CONTRAST TECHNIQUE: Multidetector CT imaging of the chest was performed using the standard protocol during bolus administration of intravenous contrast. Multiplanar CT image reconstructions and MIPs were obtained to evaluate the vascular anatomy. RADIATION DOSE REDUCTION: This exam was performed according to the departmental dose-optimization program which includes automated  exposure control, adjustment of the mA and/or kV according to patient size and/or use of iterative reconstruction technique. CONTRAST:  88mL OMNIPAQUE IOHEXOL 350 MG/ML SOLN COMPARISON:  Radiographs 01/16/2022 and CT chest 11/11/2012. FINDINGS: Cardiovascular: Satisfactory opacification of the pulmonary arteries to the segmental level. No evidence of pulmonary embolism. Cardiomegaly. No pericardial effusion. Coronary artery and aortic atherosclerotic calcification. Mediastinum/Nodes: No enlarged mediastinal, hilar, or axillary lymph nodes. Thyroid  gland, trachea, and esophagus demonstrate no significant findings. Lungs/Pleura: Respiratory motion obscures fine detail. Mild diffuse interlobular septal thickening. Bronchial wall thickening and mucous plugging in the left-greater-than-right lower lobes. Atelectasis or pneumonia in the left lower lobe and lingula. Upper Abdomen: No acute abnormality. Bilateral adrenal thickening which is slightly nodular on the left slightly increased from 2014 but likely representing benign hyperplasia/adenoma. No follow-up is recommended. Cholecystectomy. Musculoskeletal: Age indeterminate fracture of the proximal right clavicle which is minimally displaced as a acute to subacute appearance. Numerous subacute and chronic bilateral rib fractures. Marked compression fracture of T4. See separate report from CT of the thoracic spine for details. Review of the MIP images confirms the above findings. IMPRESSION: 1. Negative for acute pulmonary embolism. 2. Patchy airspace opacities bilaterally greatest in the left lower lobe suspicious for pneumonia. 3. Cardiomegaly and interstitial thickening suggestive of pulmonary edema. 4. Acute or subacute proximal right clavicle fracture. 5. Subacute and chronic bilateral rib fractures. 6. Age indeterminate compression fracture of T4. See separate report for findings in the thoracolumbar spine. Electronically Signed   By: Placido Sou M.D.   On: 01/17/2022 01:52   CT HEAD WO CONTRAST (5MM)  Result Date: 01/16/2022 CLINICAL DATA:  Head and neck trauma after fall on anticoagulation; bruising to the right forehead EXAM: CT HEAD WITHOUT CONTRAST TECHNIQUE: Contiguous axial images were obtained from the base of the skull through the vertex without intravenous contrast. RADIATION DOSE REDUCTION: This exam was performed according to the departmental dose-optimization program which includes automated exposure control, adjustment of the mA and/or kV according to patient size and/or use of  iterative reconstruction technique. COMPARISON:  CT head and C-spine 12/27/2021 FINDINGS: CT HEAD FINDINGS Brain: No intracranial hemorrhage, mass effect, or evidence of acute infarct. No hydrocephalus. No extra-axial fluid collection. Generalized cerebral atrophy. Ill-defined hypoattenuation within the cerebral white matter is nonspecific but consistent with chronic small vessel ischemic disease. Vascular: No hyperdense vessel. Intracranial arterial calcification. Skull: No fracture or focal lesion. Scalp hematoma over the right forehead. Sinuses/Orbits: Mucosal thickening with air-fluid levels in both maxillary sinuses. Mucosal thickening in the ethmoid air cells. Other: None. CT CERVICAL SPINE FINDINGS Alignment: Multiple low-grade vertebral body subluxations are similar to prior. Skull base and vertebrae: No acute fracture. Redemonstrated subacute superior endplate compression fractures and sclerosis of T2 and partially visualized T3. Minimally displaced fracture through the T2 spinous process with signs of sclerosis about the fracture line is also unchanged. Soft tissues and spinal canal: No prevertebral fluid or swelling. No visible canal hematoma. Disc levels: Unchanged multilevel advanced spondylosis and facet arthropathy. Posterior disc osteophyte complexes cause multilevel moderate spinal canal narrowing. Uncovertebral spurring and facet arthropathy cause advanced neural foraminal narrowing bilaterally at C4-C5. Upper chest: Negative. Other: None. IMPRESSION: 1. No acute intracranial abnormality. Generalized atrophy and small vessel white matter disease. 2. No acute fracture in the cervical spine. Multilevel degenerative spondylosis. 3. Subacute compression fractures of T2 and T3 and mildly displaced subacute fracture of the T2 spinous process. 4. Right forehead scalp hematoma.  No calvarial fracture. 5. Mucosal thickening in the  ethmoid and bilateral maxillary sinuses with air-fluid levels can be seen  with acute sinusitis. Electronically Signed   By: Placido Sou M.D.   On: 01/16/2022 23:47   CT Cervical Spine Wo Contrast  Result Date: 01/16/2022 CLINICAL DATA:  Head and neck trauma after fall on anticoagulation; bruising to the right forehead EXAM: CT HEAD WITHOUT CONTRAST TECHNIQUE: Contiguous axial images were obtained from the base of the skull through the vertex without intravenous contrast. RADIATION DOSE REDUCTION: This exam was performed according to the departmental dose-optimization program which includes automated exposure control, adjustment of the mA and/or kV according to patient size and/or use of iterative reconstruction technique. COMPARISON:  CT head and C-spine 12/27/2021 FINDINGS: CT HEAD FINDINGS Brain: No intracranial hemorrhage, mass effect, or evidence of acute infarct. No hydrocephalus. No extra-axial fluid collection. Generalized cerebral atrophy. Ill-defined hypoattenuation within the cerebral white matter is nonspecific but consistent with chronic small vessel ischemic disease. Vascular: No hyperdense vessel. Intracranial arterial calcification. Skull: No fracture or focal lesion. Scalp hematoma over the right forehead. Sinuses/Orbits: Mucosal thickening with air-fluid levels in both maxillary sinuses. Mucosal thickening in the ethmoid air cells. Other: None. CT CERVICAL SPINE FINDINGS Alignment: Multiple low-grade vertebral body subluxations are similar to prior. Skull base and vertebrae: No acute fracture. Redemonstrated subacute superior endplate compression fractures and sclerosis of T2 and partially visualized T3. Minimally displaced fracture through the T2 spinous process with signs of sclerosis about the fracture line is also unchanged. Soft tissues and spinal canal: No prevertebral fluid or swelling. No visible canal hematoma. Disc levels: Unchanged multilevel advanced spondylosis and facet arthropathy. Posterior disc osteophyte complexes cause multilevel moderate spinal  canal narrowing. Uncovertebral spurring and facet arthropathy cause advanced neural foraminal narrowing bilaterally at C4-C5. Upper chest: Negative. Other: None. IMPRESSION: 1. No acute intracranial abnormality. Generalized atrophy and small vessel white matter disease. 2. No acute fracture in the cervical spine. Multilevel degenerative spondylosis. 3. Subacute compression fractures of T2 and T3 and mildly displaced subacute fracture of the T2 spinous process. 4. Right forehead scalp hematoma.  No calvarial fracture. 5. Mucosal thickening in the ethmoid and bilateral maxillary sinuses with air-fluid levels can be seen with acute sinusitis. Electronically Signed   By: Placido Sou M.D.   On: 01/16/2022 23:47   DG Chest 1 View  Result Date: 01/16/2022 CLINICAL DATA:  Chest pain EXAM: CHEST  1 VIEW COMPARISON:  12/09/2021, CT 11/11/2012 FINDINGS: Mild cardiomegaly. Mild diffuse reticular interstitial opacity. Probable subsegmental atelectasis at the bases. No pneumothorax. Aortic atherosclerosis. Old right clavicle fracture. IMPRESSION: 1. Mild diffuse reticular interstitial opacity suggestive of underlying chronic disease. There is probable subsegmental atelectasis at the bases 2. Cardiomegaly Electronically Signed   By: Donavan Foil M.D.   On: 01/16/2022 23:41            LOS: 10 days    Time spent: [place], DO [facility] 01/27/2022, 2:53 PM   Staff may message me via secure chat in [place]  but this may not receive immediate response,  please page for urgent matters!  If 7PM-7AM, please contact night-coverage [email or web address]  Dictation software was used to generate the above note. Typos may occur and escape review, as with typed/written notes. Please contact Dr Sheppard Coil directly for clarity if needed.

## 2022-01-27 NOTE — Plan of Care (Signed)
°  Problem: Clinical Measurements: °Goal: Diagnostic test results will improve °Outcome: Progressing °  °Problem: Clinical Measurements: °Goal: Respiratory complications will improve °Outcome: Progressing °  °Problem: Activity: °Goal: Risk for activity intolerance will decrease °Outcome: Progressing °  °Problem: Nutrition: °Goal: Adequate nutrition will be maintained °Outcome: Progressing °  °

## 2022-01-28 MED ORDER — SENNOSIDES-DOCUSATE SODIUM 8.6-50 MG PO TABS
2.0000 | ORAL_TABLET | Freq: Every day | ORAL | Status: DC
  Administered 2022-01-28: 2 via ORAL
  Filled 2022-01-28: qty 2

## 2022-01-28 NOTE — Care Management Important Message (Signed)
Important Message  Patient Details  Name: Jane Perez MRN: [id number] Date of Birth: August 19, 1930   Medicare Important Message Given:  Other (see comment)  Tried calling the patient's son, Jane Perez ([phone]) but received Jane message that the call cannot be completed at this time. Will try again.   Jane Perez Jane Perez 01/28/2022, 3:04 PM

## 2022-01-28 NOTE — Progress Notes (Signed)
PROGRESS NOTE    Jane Perez   AGT:[id number] DOB: 1930/03/27  DOA: 01/16/2022 Date of Service: 01/28/22 PCP: Tracie Harrier, MD     Brief Narrative / Hospital Course:  Jane Perez is a 88 y.o. 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 She presented from SNF to the ED on 01/16/2022 with fall. Has frequent falls as demonstrated by multiple healed fractures on admission imaging. 11/08:In the ED, it was found that they had BP 101/59 with otherwise normal vitals. Labs with troponin of 10, BNP 148.  Hemoglobin at baseline at 11.9.  Creatinine near baseline at 1.06.  COVID-positive, urinalysis with moderate leukocyte esterase and many bacteria.  Significant findings included CT head and C-spine T-spine L-spine showing remote compression deformities of T2-T3-T4 and L1 CTA chest negative for PE and showing patchy airspace opacities in the left lower lobe suspicious for pneumonia among other findings including remote rib and clavicle fractures. They were initially treated with paxlovid, steroids and supplemental oxygen. Patient was admitted to medicine service for further workup and management of covid 19  11/14 - 11/18: stable, improving and weaned to room air. Barrier to discharge is SNF acceptance policy s/p COVID. Will not be able to discharge until 11/19 at earliest, will need bed choice as well as Ins approval  11/19 today is Sunday, will ask TOC to follow tomorrow 11/20 11/20: still pending ned offers.   Consultants:  none  Procedures: none      ASSESSMENT & PLAN:   Principal Problem:   Fall Active Problems:   Pneumonia due to COVID-19 virus   Hypoxia   Urinary tract infection   History of bladder cancer   History of DVT (deep vein thrombosis)   Pneumonia   Acute respiratory failure with hypoxia  (HCC)   Closed nondisplaced fracture of right clavicle   COVID-19   Viral pneumonia   Acute sepsis ([place])   Fall resulting in no significant injuries.  History of frequent falls Multifactorial in setting of acute illness, hypotension, bradycardia.  - Fall precautions and PT eval/treatment --> SNF placement pending  - ortho curbside on clavicle fracture and does not recommend intervention. Likely subacute given patient not complaining of pain   Hypotension- stable - stopped IV maintenance fluids - hold home BP meds   Pneumonia due to COVID-19 virus- improving/resolved  Hypoxic to 89% on presentation, now stable on room air. Procalcitonin normal. Less likely to have secondary bacterial pna.  Blood cultures negative.  - antivirals completed. - Oral dexamethasone completed - Incentive spirometer and supportive care    History of DVT (deep vein thrombosis) - Continue Eliquis   History of bladder cancer History of lung cancer - Followed by [facility] hospice/palliative care   Urinary tract infection described on admission- she was asymptomatic and urinalysis not consistent with infection. Had Klebsiella UTI 12/09/2021. Received initial dose of rocephin in ED. Urine cx positive for multiple species including proteus mirabilis and enterococcus faecalis. She remains asymptomatic and stable. Will continue to monitor off Abx - follow urine output   Dementia- very pleasant. Mildly confused. Oriented to self and place - OOB as much as possible     DVT prophylaxis: eliquis  Pertinent IV fluids/nutrition: none Central lines / invasive devices: none  Code Status: FULL CODE  Family Communication: none at this time  Disposition: inpatient, plan d/c SNF  TOC needs: none at this time will check back tomorrow which is Monday 01/28/22 Barriers to discharge / significant pending items: SNF protocol for COVID(+) pts precludes d/c              Subjective:  Patient resting  comfortably, no concners from RN        Objective:  Vitals:   01/27/22 0857 01/27/22 1545 01/28/22 0534 01/28/22 0813  BP: 113/70 130/64 123/62 110/62  Pulse: 84 88 92 89  Resp:   18   Temp: 98.5 F (36.9 C) 98.5 F (36.9 C) 98 F (36.7 C) 98.2 F (36.8 C)  TempSrc:      SpO2: 91% 93% 91% 94%  Weight:      Height:        Intake/Output Summary (Last 24 hours) at 01/28/2022 1438 Last data filed at 01/28/2022 0535 Gross per 24 hour  Intake 237 ml  Output 250 ml  Net -13 ml   Filed Weights   01/17/22 0418  Weight: 72.6 kg    Examination: Constitutional:  VS as above General Appearance:  NAD Respiratory: Normal respiratory effort       Scheduled Medications:   albuterol  2 puff Inhalation Q6H   apixaban  5 mg Oral BID   feeding supplement  237 mL Oral TID BM   gabapentin  100 mg Oral QHS   multivitamin with minerals  1 tablet Oral Daily   polyethylene glycol  17 g Oral Daily   senna-docusate  2 tablet Oral QHS   traZODone  50 mg Oral QHS    Continuous Infusions:   PRN Medications:  acetaminophen, bisacodyl, chlorpheniramine-HYDROcodone, guaiFENesin-dextromethorphan, ondansetron **OR** ondansetron (ZOFRAN) IV, oxyCODONE  Antimicrobials:  Anti-infectives (From admission, onward)    Start     Dose/Rate Route Frequency Ordered Stop   01/18/22 0200  cefTRIAXone (ROCEPHIN) 1 g in sodium chloride 0.9 % 100 mL IVPB  Status:  Discontinued        1 g 200 mL/hr over 30 Minutes Intravenous Every 24 hours 01/17/22 0215 01/17/22 1227   01/17/22 2200  molnupiravir EUA (LAGEVRIO) capsule 800 mg        4 capsule Oral 2 times daily 01/17/22 1027 01/22/22 0945   01/17/22 0145  nirmatrelvir/ritonavir EUA (renal dosing) (PAXLOVID) 2 tablet  Status:  Discontinued        2 tablet Oral 2 times daily 01/17/22 0142 01/17/22 1027   01/17/22 0130  nirmatrelvir/ritonavir EUA (PAXLOVID) 3 tablet  Status:  Discontinued        3 tablet Oral 2 times daily 01/17/22 0118  01/17/22 0142   01/17/22 0130  cefTRIAXone (ROCEPHIN) 1 g in sodium chloride 0.9 % 100 mL IVPB        1 g 200 mL/hr over 30 Minutes Intravenous  Once 01/17/22 0127 01/17/22 0241       Data Reviewed: I have personally reviewed following labs and imaging studies  CBC: Recent Labs  Lab 01/23/22 0453  WBC 8.2  HGB 13.3  HCT 40.5  MCV 95.1  PLT 433   Basic Metabolic Panel: Recent Labs  Lab 01/23/22 0453  NA 138  K 4.2  CL 108  CO2 27  GLUCOSE 93  BUN 25*  CREATININE 0.79  CALCIUM 8.7*   GFR: Estimated Creatinine Clearance: 45.5 mL/min (by C-G formula based on SCr of 0.79 mg/dL). Liver Function Tests: No results for input(s): "AST", "ALT", "ALKPHOS", "BILITOT", "PROT", "ALBUMIN" in the last 168 hours.  No results for input(s): "LIPASE", "  AMYLASE" in the last 168 hours. No results for input(s): "AMMONIA" in the last 168 hours. Coagulation Profile: No results for input(s): "INR", "PROTIME" in the last 168 hours. Cardiac Enzymes: No results for input(s): "CKTOTAL", "CKMB", "CKMBINDEX", "TROPONINI" in the last 168 hours. BNP (last 3 results) No results for input(s): "PROBNP" in the last 8760 hours. HbA1C: No results for input(s): "HGBA1C" in the last 72 hours. CBG: No results for input(s): "GLUCAP" in the last 168 hours. Lipid Profile: No results for input(s): "CHOL", "HDL", "LDLCALC", "TRIG", "CHOLHDL", "LDLDIRECT" in the last 72 hours. Thyroid Function Tests: No results for input(s): "TSH", "T4TOTAL", "FREET4", "T3FREE", "THYROIDAB" in the last 72 hours. Anemia Panel: No results for input(s): "VITAMINB12", "FOLATE", "FERRITIN", "TIBC", "IRON", "RETICCTPCT" in the last 72 hours. Urine analysis:    Component Value Date/Time   COLORURINE AMBER (A) 01/16/2022 2355   APPEARANCEUR CLOUDY (A) 01/16/2022 2355   APPEARANCEUR Clear 04/11/2015 1339   LABSPEC 1.011 01/16/2022 2355   PHURINE 8.0 01/16/2022 2355   GLUCOSEU NEGATIVE 01/16/2022 2355   HGBUR NEGATIVE  01/16/2022 2355   BILIRUBINUR NEGATIVE 01/16/2022 2355   BILIRUBINUR Negative 04/11/2015 1339   KETONESUR NEGATIVE 01/16/2022 2355   PROTEINUR 30 (A) 01/16/2022 2355   NITRITE NEGATIVE 01/16/2022 2355   LEUKOCYTESUR MODERATE (A) 01/16/2022 2355   Sepsis Labs: @LABRCNTIP (procalcitonin:4,lacticidven:4)  No results found for this or any previous visit (from the past 240 hour(s)).        Radiology Studies: CT T-SPINE NO CHARGE  Result Date: 01/17/2022 CLINICAL DATA:  Frequent falls, found down EXAM: CT THORACIC AND LUMBAR SPINE WITHOUT CONTRAST TECHNIQUE: Multidetector CT imaging of the thoracic and lumbar spine was performed without contrast. Multiplanar CT image reconstructions were also generated. RADIATION DOSE REDUCTION: This exam was performed according to the departmental dose-optimization program which includes automated exposure control, adjustment of the mA and/or kV according to patient size and/or use of iterative reconstruction technique. COMPARISON:  09/02/2021 CT T and L-spine; correlation is also made with CT cervical spine 12/27/2021 FINDINGS: CT THORACIC SPINE FINDINGS Alignment: No traumatic listhesis. Exaggeration of the normal thoracic kyphosis. Scoliosis. Vertebrae: Osteopenia. Unchanged remote compression deformities T3 and T4. 10% vertebral body height loss and increased density of the superior aspect of T2, which appears unchanged compared to the 12/27/2021 CT cervical spine. Unchanged remote T2 spinous process fracture (series 9, image 56) no definite acute fracture. Paraspinal and other soft tissues: Please see same-day CT chest Disc levels: Multilevel degenerative disc disease without significant spinal canal stenosis. CT LUMBAR SPINE FINDINGS Segmentation: 5 lumbar-type vertebral bodies. Alignment: Exaggeration of the normal lumbar lordosis. Lumbar levoscoliosis. 3 mm anterolisthesis of T12 on L1. 3 mm retrolisthesis of L1 on L2. 3 mm anterolisthesis of L3 on L4, L4 on  L5, and L5 on S1. Vertebrae: No acute fracture or suspicious osseous lesion. Redemonstrated remote L1 compression fracture. Vertebral body heights are otherwise preserved. Paraspinal and other soft tissues: Aortic atherosclerosis. Disc levels: T12-L1: Disc height loss and grade 1 anterolisthesis. No significant spinal canal stenosis or neural foraminal narrowing. L1-L2: Disc height loss and moderate disc bulge with annular calcification. Mild facet arthropathy. Moderate spinal canal stenosis and severe right and moderate left neural foraminal narrowing. L2-L3: Mild disc bulge. Mild facet arthropathy. Ligamentum flavum hypertrophy. Moderate spinal canal stenosis. No neural foraminal narrowing. L3-L4: Trace anterolisthesis disc unroofing and moderate disc bulge. Severe facet arthropathy. Ligamentum flavum hypertrophy. Severe spinal canal stenosis. Severe left neural foraminal narrowing. L4-L5: Anterolisthesis and disc unroofing with moderate right eccentric disc  bulge. Severe facet arthropathy. Ligamentum flavum hypertrophy. Mild spinal canal stenosis. No neural foraminal narrowing. L5-S1: Anterolisthesis with disc unroofing. Severe facet arthropathy. Severe spinal canal stenosis. Moderate left neural foraminal narrowing. IMPRESSION: 1. No acute fracture or traumatic listhesis in the thoracic or lumbar spine. 2. Unchanged remote compression deformities of T2, T3, T4, and L1. 3. Multilevel degenerative disc disease and spondylolisthesis, as described above. 4. Aortic atherosclerosis. Aortic Atherosclerosis (ICD10-I70.0). Electronically Signed   By: Merilyn Baba M.D.   On: 01/17/2022 01:56   CT Lumbar Spine Wo Contrast  Result Date: 01/17/2022 CLINICAL DATA:  Frequent falls, found down EXAM: CT THORACIC AND LUMBAR SPINE WITHOUT CONTRAST TECHNIQUE: Multidetector CT imaging of the thoracic and lumbar spine was performed without contrast. Multiplanar CT image reconstructions were also generated. RADIATION DOSE  REDUCTION: This exam was performed according to the departmental dose-optimization program which includes automated exposure control, adjustment of the mA and/or kV according to patient size and/or use of iterative reconstruction technique. COMPARISON:  09/02/2021 CT T and L-spine; correlation is also made with CT cervical spine 12/27/2021 FINDINGS: CT THORACIC SPINE FINDINGS Alignment: No traumatic listhesis. Exaggeration of the normal thoracic kyphosis. Scoliosis. Vertebrae: Osteopenia. Unchanged remote compression deformities T3 and T4. 10% vertebral body height loss and increased density of the superior aspect of T2, which appears unchanged compared to the 12/27/2021 CT cervical spine. Unchanged remote T2 spinous process fracture (series 9, image 56) no definite acute fracture. Paraspinal and other soft tissues: Please see same-day CT chest Disc levels: Multilevel degenerative disc disease without significant spinal canal stenosis. CT LUMBAR SPINE FINDINGS Segmentation: 5 lumbar-type vertebral bodies. Alignment: Exaggeration of the normal lumbar lordosis. Lumbar levoscoliosis. 3 mm anterolisthesis of T12 on L1. 3 mm retrolisthesis of L1 on L2. 3 mm anterolisthesis of L3 on L4, L4 on L5, and L5 on S1. Vertebrae: No acute fracture or suspicious osseous lesion. Redemonstrated remote L1 compression fracture. Vertebral body heights are otherwise preserved. Paraspinal and other soft tissues: Aortic atherosclerosis. Disc levels: T12-L1: Disc height loss and grade 1 anterolisthesis. No significant spinal canal stenosis or neural foraminal narrowing. L1-L2: Disc height loss and moderate disc bulge with annular calcification. Mild facet arthropathy. Moderate spinal canal stenosis and severe right and moderate left neural foraminal narrowing. L2-L3: Mild disc bulge. Mild facet arthropathy. Ligamentum flavum hypertrophy. Moderate spinal canal stenosis. No neural foraminal narrowing. L3-L4: Trace anterolisthesis disc  unroofing and moderate disc bulge. Severe facet arthropathy. Ligamentum flavum hypertrophy. Severe spinal canal stenosis. Severe left neural foraminal narrowing. L4-L5: Anterolisthesis and disc unroofing with moderate right eccentric disc bulge. Severe facet arthropathy. Ligamentum flavum hypertrophy. Mild spinal canal stenosis. No neural foraminal narrowing. L5-S1: Anterolisthesis with disc unroofing. Severe facet arthropathy. Severe spinal canal stenosis. Moderate left neural foraminal narrowing. IMPRESSION: 1. No acute fracture or traumatic listhesis in the thoracic or lumbar spine. 2. Unchanged remote compression deformities of T2, T3, T4, and L1. 3. Multilevel degenerative disc disease and spondylolisthesis, as described above. 4. Aortic atherosclerosis. Aortic Atherosclerosis (ICD10-I70.0). Electronically Signed   By: Merilyn Baba M.D.   On: 01/17/2022 01:56   CT Angio Chest PE W and/or Wo Contrast  Result Date: 01/17/2022 CLINICAL DATA:  88 year old female with history of DVT and PE on anticoagulation; found down at her facility EXAM: CT ANGIOGRAPHY CHEST WITH CONTRAST TECHNIQUE: Multidetector CT imaging of the chest was performed using the standard protocol during bolus administration of intravenous contrast. Multiplanar CT image reconstructions and MIPs were obtained to evaluate the vascular anatomy. RADIATION DOSE REDUCTION:  This exam was performed according to the departmental dose-optimization program which includes automated exposure control, adjustment of the mA and/or kV according to patient size and/or use of iterative reconstruction technique. CONTRAST:  39mL OMNIPAQUE IOHEXOL 350 MG/ML SOLN COMPARISON:  Radiographs 01/16/2022 and CT chest 11/11/2012. FINDINGS: Cardiovascular: Satisfactory opacification of the pulmonary arteries to the segmental level. No evidence of pulmonary embolism. Cardiomegaly. No pericardial effusion. Coronary artery and aortic atherosclerotic calcification.  Mediastinum/Nodes: No enlarged mediastinal, hilar, or axillary lymph nodes. Thyroid gland, trachea, and esophagus demonstrate no significant findings. Lungs/Pleura: Respiratory motion obscures fine detail. Mild diffuse interlobular septal thickening. Bronchial wall thickening and mucous plugging in the left-greater-than-right lower lobes. Atelectasis or pneumonia in the left lower lobe and lingula. Upper Abdomen: No acute abnormality. Bilateral adrenal thickening which is slightly nodular on the left slightly increased from 2014 but likely representing benign hyperplasia/adenoma. No follow-up is recommended. Cholecystectomy. Musculoskeletal: Age indeterminate fracture of the proximal right clavicle which is minimally displaced as a acute to subacute appearance. Numerous subacute and chronic bilateral rib fractures. Marked compression fracture of T4. See separate report from CT of the thoracic spine for details. Review of the MIP images confirms the above findings. IMPRESSION: 1. Negative for acute pulmonary embolism. 2. Patchy airspace opacities bilaterally greatest in the left lower lobe suspicious for pneumonia. 3. Cardiomegaly and interstitial thickening suggestive of pulmonary edema. 4. Acute or subacute proximal right clavicle fracture. 5. Subacute and chronic bilateral rib fractures. 6. Age indeterminate compression fracture of T4. See separate report for findings in the thoracolumbar spine. Electronically Signed   By: Placido Sou M.D.   On: 01/17/2022 01:52   CT HEAD WO CONTRAST (5MM)  Result Date: 01/16/2022 CLINICAL DATA:  Head and neck trauma after fall on anticoagulation; bruising to the right forehead EXAM: CT HEAD WITHOUT CONTRAST TECHNIQUE: Contiguous axial images were obtained from the base of the skull through the vertex without intravenous contrast. RADIATION DOSE REDUCTION: This exam was performed according to the departmental dose-optimization program which includes automated exposure  control, adjustment of the mA and/or kV according to patient size and/or use of iterative reconstruction technique. COMPARISON:  CT head and C-spine 12/27/2021 FINDINGS: CT HEAD FINDINGS Brain: No intracranial hemorrhage, mass effect, or evidence of acute infarct. No hydrocephalus. No extra-axial fluid collection. Generalized cerebral atrophy. Ill-defined hypoattenuation within the cerebral white matter is nonspecific but consistent with chronic small vessel ischemic disease. Vascular: No hyperdense vessel. Intracranial arterial calcification. Skull: No fracture or focal lesion. Scalp hematoma over the right forehead. Sinuses/Orbits: Mucosal thickening with air-fluid levels in both maxillary sinuses. Mucosal thickening in the ethmoid air cells. Other: None. CT CERVICAL SPINE FINDINGS Alignment: Multiple low-grade vertebral body subluxations are similar to prior. Skull base and vertebrae: No acute fracture. Redemonstrated subacute superior endplate compression fractures and sclerosis of T2 and partially visualized T3. Minimally displaced fracture through the T2 spinous process with signs of sclerosis about the fracture line is also unchanged. Soft tissues and spinal canal: No prevertebral fluid or swelling. No visible canal hematoma. Disc levels: Unchanged multilevel advanced spondylosis and facet arthropathy. Posterior disc osteophyte complexes cause multilevel moderate spinal canal narrowing. Uncovertebral spurring and facet arthropathy cause advanced neural foraminal narrowing bilaterally at C4-C5. Upper chest: Negative. Other: None. IMPRESSION: 1. No acute intracranial abnormality. Generalized atrophy and small vessel white matter disease. 2. No acute fracture in the cervical spine. Multilevel degenerative spondylosis. 3. Subacute compression fractures of T2 and T3 and mildly displaced subacute fracture of the T2 spinous process. 4.  Right forehead scalp hematoma.  No calvarial fracture. 5. Mucosal thickening in  the ethmoid and bilateral maxillary sinuses with air-fluid levels can be seen with acute sinusitis. Electronically Signed   By: Placido Sou M.D.   On: 01/16/2022 23:47   CT Cervical Spine Wo Contrast  Result Date: 01/16/2022 CLINICAL DATA:  Head and neck trauma after fall on anticoagulation; bruising to the right forehead EXAM: CT HEAD WITHOUT CONTRAST TECHNIQUE: Contiguous axial images were obtained from the base of the skull through the vertex without intravenous contrast. RADIATION DOSE REDUCTION: This exam was performed according to the departmental dose-optimization program which includes automated exposure control, adjustment of the mA and/or kV according to patient size and/or use of iterative reconstruction technique. COMPARISON:  CT head and C-spine 12/27/2021 FINDINGS: CT HEAD FINDINGS Brain: No intracranial hemorrhage, mass effect, or evidence of acute infarct. No hydrocephalus. No extra-axial fluid collection. Generalized cerebral atrophy. Ill-defined hypoattenuation within the cerebral white matter is nonspecific but consistent with chronic small vessel ischemic disease. Vascular: No hyperdense vessel. Intracranial arterial calcification. Skull: No fracture or focal lesion. Scalp hematoma over the right forehead. Sinuses/Orbits: Mucosal thickening with air-fluid levels in both maxillary sinuses. Mucosal thickening in the ethmoid air cells. Other: None. CT CERVICAL SPINE FINDINGS Alignment: Multiple low-grade vertebral body subluxations are similar to prior. Skull base and vertebrae: No acute fracture. Redemonstrated subacute superior endplate compression fractures and sclerosis of T2 and partially visualized T3. Minimally displaced fracture through the T2 spinous process with signs of sclerosis about the fracture line is also unchanged. Soft tissues and spinal canal: No prevertebral fluid or swelling. No visible canal hematoma. Disc levels: Unchanged multilevel advanced spondylosis and facet  arthropathy. Posterior disc osteophyte complexes cause multilevel moderate spinal canal narrowing. Uncovertebral spurring and facet arthropathy cause advanced neural foraminal narrowing bilaterally at C4-C5. Upper chest: Negative. Other: None. IMPRESSION: 1. No acute intracranial abnormality. Generalized atrophy and small vessel white matter disease. 2. No acute fracture in the cervical spine. Multilevel degenerative spondylosis. 3. Subacute compression fractures of T2 and T3 and mildly displaced subacute fracture of the T2 spinous process. 4. Right forehead scalp hematoma.  No calvarial fracture. 5. Mucosal thickening in the ethmoid and bilateral maxillary sinuses with air-fluid levels can be seen with acute sinusitis. Electronically Signed   By: Placido Sou M.D.   On: 01/16/2022 23:47   DG Chest 1 View  Result Date: 01/16/2022 CLINICAL DATA:  Chest pain EXAM: CHEST  1 VIEW COMPARISON:  12/09/2021, CT 11/11/2012 FINDINGS: Mild cardiomegaly. Mild diffuse reticular interstitial opacity. Probable subsegmental atelectasis at the bases. No pneumothorax. Aortic atherosclerosis. Old right clavicle fracture. IMPRESSION: 1. Mild diffuse reticular interstitial opacity suggestive of underlying chronic disease. There is probable subsegmental atelectasis at the bases 2. Cardiomegaly Electronically Signed   By: Donavan Foil M.D.   On: 01/16/2022 23:41            LOS: 11 days    Time spent: [place], DO [facility] 01/28/2022, 2:38 PM   Staff may message me via secure chat in [place]  but this may not receive immediate response,  please page for urgent matters!  If 7PM-7AM, please contact night-coverage [email or web address]  Dictation software was used to generate the above note. Typos may occur and escape review, as with typed/written notes. Please contact Dr Sheppard Coil directly for clarity if needed.

## 2022-01-28 NOTE — TOC Progression Note (Signed)
Transition of Care [facility]) - Progression Note    Patient Details  Name: Jane Perez MRN: [id number] Date of Birth: [birth year]  Transition of Care [facility] CM/SW [place], RN Phone Number: 01/28/2022, 12:03 PM  Clinical Narrative:    Has 3 bed offers  I spoke with the patient's son Jane Perez, Reviewed the bed offers with him and reviewed with the patient  They both chose [place] notified Kenney Houseman,  Ins pending    Expected Discharge Plan: Skilled Nursing Facility Barriers to Discharge: Continued Medical Work up, Orthoptist and Services Expected Discharge Plan: [place]       Living arrangements for the past 2 months: [place]                                       Social Determinants of Health (SDOH) Interventions    Readmission Risk Interventions     No data to display

## 2022-01-28 NOTE — TOC Progression Note (Signed)
Transition of Care [facility]) - Progression Note    Patient Details  Name: Jane Perez MRN: [id number] Date of Birth: 27-Mar-1930  Transition of Care [facility]) CM/SW [place], RN Phone Number: 01/28/2022, 4:07 PM  Clinical Narrative:    Ins approved Ref ID [id number] 11/21-11/27   Expected Discharge Plan: [place] Barriers to Discharge: Continued Medical Work up, Ship broker  Expected Discharge Plan and Services Expected Discharge Plan: [place]       Living arrangements for the past 2 months: [place]                                       Social Determinants of Health (SDOH) Interventions    Readmission Risk Interventions     No data to display

## 2022-01-28 NOTE — Plan of Care (Signed)
  Problem: Education: Goal: Knowledge of risk factors and measures for prevention of condition will improve Outcome: Progressing   Problem: Respiratory: Goal: Will maintain a patent airway Outcome: Progressing   Problem: Education: Goal: Knowledge of General Education information will improve Description: Including pain rating scale, medication(s)/side effects and non-pharmacologic comfort measures Outcome: Progressing   Problem: Activity: Goal: Risk for activity intolerance will decrease Outcome: Progressing   Problem: Nutrition: Goal: Adequate nutrition will be maintained Outcome: Progressing   Problem: Safety: Goal: Ability to remain free from injury will improve Outcome: Progressing   Problem: Skin Integrity: Goal: Risk for impaired skin integrity will decrease Outcome: Progressing

## 2022-01-28 NOTE — Progress Notes (Signed)
Physical Therapy Treatment Patient Details Name: Jane Perez MRN: [id number] DOB: 1930/08/23 Today's Date: 01/28/2022   History of Present Illness Jane Perez is an 88 y.o. 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 Admitted  on 01/16/2022 with fall and found to be COVID + with PNA. Has frequent falls as demonstrated by multiple healed fractures on admission imaging including remote T2-4 and L1 compression fractures.    PT Comments    Pt is making gradual progress towards goals with ability to stand at bedside and take several sidesteps up towards [place]. Pt follows commands well, however still needs +2 for safe mobility. Able to perform there-ex with cues. Still appropriate for SNF dc. Will continue to progress.   Recommendations for follow up therapy are one component of a multi-disciplinary discharge planning process, led by the attending physician.  Recommendations may be updated based on patient status, additional functional criteria and insurance authorization.  Follow Up Recommendations  Skilled nursing-short term rehab (<3 hours/day) Can patient physically be transported by private vehicle: No   Assistance Recommended at Discharge Frequent or constant Supervision/Assistance  Patient can return home with the following Two people to help with walking and/or transfers;Two people to help with bathing/dressing/bathroom;Assist for transportation;Help with stairs or ramp for entrance   Equipment Recommendations   (TBD)    Recommendations for Other Services       Precautions / Restrictions Precautions Precautions: Fall Restrictions Weight Bearing Restrictions: No     Mobility  Bed Mobility Overal bed mobility: Needs Assistance Bed Mobility: Supine to Sit, Sit to Supine Rolling: Mod assist   Supine to  sit: Mod assist Sit to supine: Max assist   General bed mobility comments: still requires significant assist for bed mobility and is fearful of falls. Once seated, able to support self with slight post leaning    Transfers Overall transfer level: Needs assistance Equipment used: Rolling walker (2 wheels) Transfers: Sit to/from Stand Sit to Stand: Mod assist, +2 physical assistance, From elevated surface           General transfer comment: able to follow commands and stand x 3 reps each for 45 seconds. Cues for weight shifting and upright posture.    Ambulation/Gait Ambulation/Gait assistance: Mod assist Gait Distance (Feet): 3 Feet Assistive device: Rolling walker (2 wheels) Gait Pattern/deviations: Step-to pattern       General Gait Details: ambulated with side steps up towards HOB and use of RW. +2 for safety. Still demonstrates slight post lean   Stairs             Wheelchair Mobility    Modified Rankin (Stroke Patients Only)       Balance Overall balance assessment: History of Falls, Needs assistance Sitting-balance support: Single extremity supported, Feet supported Sitting balance-Leahy Scale: Fair     Standing balance support: Bilateral upper extremity supported, Reliant on assistive device for balance Standing balance-Leahy Scale: Fair                              Cognition Arousal/Alertness: Awake/alert Behavior During Therapy: WFL for tasks assessed/performed Overall Cognitive Status: No family/caregiver present to determine baseline cognitive functioning  General Comments: follows commands well, alert to self and place        Exercises Other Exercises Other Exercises: supine ther-ex performed on B LE including SLRs, hip abd/add, and heel slides. 10 reps performed with supervision Other Exercises: Pt noted to be soiled upon arrival. +2 for rolling and linen changed. Needs cues for  following commands. Other Exercises: Once seated, weight shifting performed with pt able to reach for RW 10 times alternating hands with various distances performed    General Comments        Pertinent Vitals/Pain Pain Assessment Pain Assessment: No/denies pain    Home Living                          Prior Function            PT Goals (current goals can now be found in the care plan section) Acute Rehab PT Goals Patient Stated Goal: to get out of the hospital PT Goal Formulation: With patient Time For Goal Achievement: 01/31/22 Potential to Achieve Goals: Good Progress towards PT goals: Progressing toward goals    Frequency    Min 2X/week      PT Plan Current plan remains appropriate    Co-evaluation              AM-PAC PT "6 Clicks" Mobility   Outcome Measure  Help needed turning from your back to your side while in a flat bed without using bedrails?: A Lot Help needed moving from lying on your back to sitting on the side of a flat bed without using bedrails?: A Lot Help needed moving to and from a bed to a chair (including a wheelchair)?: A Lot Help needed standing up from a chair using your arms (e.g., wheelchair or bedside chair)?: A Lot Help needed to walk in hospital room?: Total Help needed climbing 3-5 steps with a railing? : Total 6 Click Score: 10    End of Session Equipment Utilized During Treatment: Gait belt Activity Tolerance: Patient tolerated treatment well Patient left: in bed;with bed alarm set Nurse Communication: Mobility status PT Visit Diagnosis: Muscle weakness (generalized) (M62.81);Difficulty in walking, not elsewhere classified (R26.2);Repeated falls (R29.6)     Time: 6945-0388 PT Time Calculation (min) (ACUTE ONLY): 24 min  Charges:  $Therapeutic Exercise: 8-22 mins $Therapeutic Activity: 8-22 mins                     Jane Perez, PT, DPT, GCS [phone]    Jane Perez 01/28/2022, 12:11 PM

## 2022-01-28 NOTE — Plan of Care (Signed)
  Problem: Activity: Goal: Risk for activity intolerance will decrease Outcome: Progressing   Problem: Nutrition: Goal: Adequate nutrition will be maintained Outcome: Progressing   Problem: Elimination: Goal: Will not experience complications related to bowel motility Outcome: Progressing   

## 2022-01-29 LAB — CBC
HCT: 41.3 % (ref 36.0–46.0)
Hemoglobin: 13.7 g/dL (ref 12.0–15.0)
MCH: 31.6 pg (ref 26.0–34.0)
MCHC: 33.2 g/dL (ref 30.0–36.0)
MCV: 95.2 fL (ref 80.0–100.0)
Platelets: 147 10*3/uL — ABNORMAL LOW (ref 150–400)
RBC: 4.34 MIL/uL (ref 3.87–5.11)
RDW: 14.3 % (ref 11.5–15.5)
WBC: 5.9 10*3/uL (ref 4.0–10.5)
nRBC: 0 % (ref 0.0–0.2)

## 2022-01-29 LAB — BASIC METABOLIC PANEL
Anion gap: 6 (ref 5–15)
BUN: 19 mg/dL (ref 8–23)
CO2: 29 mmol/L (ref 22–32)
Calcium: 9 mg/dL (ref 8.9–10.3)
Chloride: 107 mmol/L (ref 98–111)
Creatinine, Ser: 0.71 mg/dL (ref 0.44–1.00)
GFR, Estimated: >60 mL/min (ref >60)
Glucose, Bld: 86 mg/dL (ref 70–99)
Potassium: 3.8 mmol/L (ref 3.5–5.1)
Sodium: 142 mmol/L (ref 135–145)

## 2022-01-29 MED ORDER — BOOST / RESOURCE BREEZE PO LIQD CUSTOM
1.0000 | Freq: Three times a day (TID) | ORAL | Status: DC
  Administered 2022-01-29: 1 via ORAL

## 2022-01-29 MED ORDER — SENNOSIDES-DOCUSATE SODIUM 8.6-50 MG PO TABS: 2.0000 | ORAL_TABLET | Freq: Every evening | ORAL | Status: DC | PRN

## 2022-01-29 MED ORDER — ACETAMINOPHEN 325 MG PO TABS: 650.0000 mg | ORAL_TABLET | Freq: Four times a day (QID) | ORAL | Status: DC | PRN

## 2022-01-29 MED ORDER — BISACODYL 10 MG RE SUPP: 10.0000 mg | Freq: Every day | RECTAL | 0 refills | Status: DC | PRN

## 2022-01-29 MED ORDER — ALBUTEROL SULFATE HFA 108 (90 BASE) MCG/ACT IN AERS: 2.0000 | INHALATION_SPRAY | Freq: Four times a day (QID) | RESPIRATORY_TRACT | 0 refills | Status: DC | PRN

## 2022-01-29 MED ORDER — FUROSEMIDE 20 MG PO TABS: 20.0000 mg | ORAL_TABLET | Freq: Every day | ORAL | 0 refills | Status: DC | PRN

## 2022-01-29 MED ORDER — POLYETHYLENE GLYCOL 3350 17 G PO PACK: 17.0000 g | PACK | Freq: Every day | ORAL | 0 refills | Status: DC

## 2022-01-29 NOTE — TOC Progression Note (Signed)
Transition of Care [facility]) - Progression Note    Patient Details  Name: Jane Perez MRN: [id number] Date of Birth: 1930/05/27  Transition of Care [facility]) CM/SW [place], RN Phone Number: 01/29/2022, 12:40 PM  Clinical Narrative:    Received a call back from the patient's son Jane Perez, I notified him of the room number 7P at [facility], EMS called to transport She is next on list   Expected Discharge Plan: [place] Barriers to Discharge: Continued Medical Work up, Ship broker  Expected Discharge Plan and Services Expected Discharge Plan: [place]       Living arrangements for the past 2 months: [place] Expected Discharge Date: 01/29/22                                     Social Determinants of Health (SDOH) Interventions    Readmission Risk Interventions     No data to display

## 2022-01-29 NOTE — TOC Progression Note (Signed)
Transition of Care [facility]) - Progression Note    Patient Details  Name: Jane Perez MRN: [id number] Date of Birth: 1930/05/16  Transition of Care [facility]) CM/SW [place], RN Phone Number: 01/29/2022, 12:24 PM  Clinical Narrative:   Called the patient's son [name] and left a general VM asking for a return call    Expected Discharge Plan: [place] Barriers to Discharge: Continued Medical Work up, Ship broker  Expected Discharge Plan and Services Expected Discharge Plan: [place]       Living arrangements for the past 2 months: [place] Expected Discharge Date: 01/29/22                                     Social Determinants of Health (SDOH) Interventions    Readmission Risk Interventions     No data to display

## 2022-01-29 NOTE — Discharge Summary (Signed)
Physician Discharge Summary   Patient: Jane Perez MRN: [id number]  DOB: 01/31/1931   Admit:     Date of Admission: 01/16/2022 Admitted from: SNF   Discharge: Date of discharge: 01/29/22 Disposition: Skilled nursing facility Condition at discharge: fair  CODE STATUS: FULL CODE      Discharge Physician: Emeterio Reeve, DO [facility]     PCP: Tracie Harrier, MD  Recommendations for Outpatient Follow-up:  Follow up with PCP Tracie Harrier, MD in 1-2 weeks Please obtain labs/tests: as needed  Please follow up on the following pending results: none PCP AND OTHER OUTPATIENT PROVIDERS: SEE BELOW FOR SPECIFIC DISCHARGE INSTRUCTIONS PRINTED FOR PATIENT IN ADDITION TO GENERIC AVS PATIENT INFO    Discharge Instructions     Call MD for:  difficulty breathing, headache or visual disturbances   Complete by: As directed    Call MD for:  temperature >100.4   Complete by: As directed    Diet - low sodium heart healthy   Complete by: As directed    Increase activity slowly   Complete by: As directed    No wound care   Complete by: As directed          Discharge Diagnoses: Principal Problem:   Fall Active Problems:   Pneumonia due to COVID-19 virus   Hypoxia   Urinary tract infection   History of bladder cancer   History of DVT (deep vein thrombosis)   Pneumonia   Acute respiratory failure with hypoxia (HCC)   Closed nondisplaced fracture of right clavicle   COVID-19   Viral pneumonia   Acute sepsis [facility])       Hospital Course: Jane Perez is a 88 y.o. 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 She presented from SNF to the ED on 01/16/2022 with fall. Has frequent falls as demonstrated by multiple healed fractures on admission imaging. 11/08:In the ED, it was found  that they had BP 101/59 with otherwise normal vitals. Labs with troponin of 10, BNP 148.  Hemoglobin at baseline at 11.9.  Creatinine near baseline at 1.06.  COVID-positive, urinalysis with moderate leukocyte esterase and many bacteria.  Significant findings included CT head and C-spine T-spine L-spine showing remote compression deformities of T2-T3-T4 and L1 CTA chest negative for PE and showing patchy airspace opacities in the left lower lobe suspicious for pneumonia among other findings including remote rib and clavicle fractures. They were initially treated with paxlovid, steroids and supplemental oxygen. Patient was admitted to medicine service for further workup and management of covid 19  11/10-11/13: stabilizing and improving  11/14 - 11/18: stable, improving and weaned to room air. Barrier to discharge is SNF acceptance policy s/p COVID. Will not be able to discharge until 11/19 at earliest, will need bed choice as well as Ins approval  11/19 today is Sunday, will ask TOC to follow tomorrow 11/20 11/20: still pending bed offers.  11/21: SNF arranged, pt stable / clear for d/c   Consultants:  none  Procedures: none      ASSESSMENT & PLAN:   Principal Problem:   Fall Active Problems:   Pneumonia due to COVID-19 virus   Hypoxia   Urinary tract infection   History of bladder cancer   History of DVT (deep vein thrombosis)   Pneumonia   Acute respiratory failure with hypoxia ([place])  Closed nondisplaced fracture of right clavicle   COVID-19   Viral pneumonia   Acute sepsis [facility])   Fall resulting in no significant injuries.  History of frequent falls Multifactorial in setting of acute illness, hypotension, bradycardia.  - Fall precautions and PT eval/treatment --> SNF placement pending  - ortho curbside on clavicle fracture and does not recommend intervention. Likely subacute given patient not complaining of pain   Hypotension- stable - stopped IV maintenance fluids - hold  home BP meds   Pneumonia due to COVID-19 virus- improving/resolved  Hypoxic to 89% on presentation, now stable on room air. Procalcitonin normal. Less likely to have secondary bacterial pna.  Blood cultures negative.  - antivirals completed. - Oral dexamethasone completed - Incentive spirometer and supportive care    History of DVT (deep vein thrombosis) - Continue Eliquis   History of bladder cancer History of lung cancer - Followed by [facility] hospice/palliative care   Urinary tract infection described on admission- she was asymptomatic and urinalysis not consistent with infection. Had Klebsiella UTI 12/09/2021. Received initial dose of rocephin in ED. Urine cx positive for multiple species including proteus mirabilis and enterococcus faecalis. She remains asymptomatic and stable. Will continue to monitor off Abx - follow urine output   Dementia- very pleasant. Mildly confused. Oriented to self and place - OOB as much as possible     Barriers to discharge: SNF protocol for COVID(+) pts precludes d/c             Discharge Instructions  Allergies as of 01/29/2022       Reactions   Aspirin Other (See Comments)   Will cause hemerage    Ciprofloxacin Hives   Codeine Other (See Comments)   Other Reaction: GI Upset   Metronidazole Hives   Penicillins Shortness Of Breath   Sulfa Antibiotics Shortness Of Breath   Clindamycin Hcl Other (See Comments)   Nitrofurantoin Monohyd Macro Other (See Comments), Nausea Only   Other Other (See Comments)   Uncoded Allergy. Allergen: flu shot, Other Reaction: Other reaction   Povidone Iodine Other (See Comments)   Other Reaction: Other reaction        Medication List     STOP taking these medications    atenolol 25 MG tablet Commonly known as: TENORMIN       TAKE these medications    acetaminophen 325 MG tablet Commonly known as: TYLENOL Take 2 tablets (650 mg total) by mouth every 6 (six) hours as needed for  mild pain or headache (fever >/= 101).   Acidophilus/Pectin Caps Take 1 capsule by mouth daily.   albuterol 108 (90 Base) MCG/ACT inhaler Commonly known as: VENTOLIN HFA Inhale 2 puffs into the lungs every 6 (six) hours as needed for wheezing or shortness of breath.   apixaban 5 MG Tabs tablet Commonly known as: ELIQUIS Take 5 mg by mouth 2 (two) times daily.   bisacodyl 10 MG suppository Commonly known as: DULCOLAX Place 1 suppository (10 mg total) rectally daily as needed for severe constipation.   Cranberry 500 MG Tabs Take 500 mg by mouth daily.   cyanocobalamin 1000 MCG tablet Commonly known as: VITAMIN B12 Take 1,000 mcg by mouth daily.   furosemide 20 MG tablet Commonly known as: LASIX Take 1 tablet (20 mg total) by mouth daily as needed for fluid or edema. What changed:  when to take this reasons to take this   gabapentin 100 MG capsule Commonly known as: NEURONTIN Take 100 mg by mouth at  bedtime.   Multi-Vitamins Tabs Take 1 tablet by mouth daily.   polyethylene glycol 17 g packet Commonly known as: MIRALAX / GLYCOLAX Take 17 g by mouth daily.   senna-docusate 8.6-50 MG tablet Commonly known as: Senokot-S Take 2 tablets by mouth at bedtime as needed for mild constipation or moderate constipation.   traZODone 50 MG tablet Commonly known as: DESYREL Take 50 mg by mouth at bedtime.   Vitamin D (Ergocalciferol) 1.25 MG (50000 UNIT) Caps capsule Commonly known as: DRISDOL Take 50,000 Units by mouth every Monday.   Vitamin E 180 MG (400 UNIT) Caps Take 400 Int'l Units by mouth daily.         Contact information for after-discharge care     [facility] .   Service: Skilled Nursing Contact information: [place] [phone]                     Allergies  Allergen Reactions   Aspirin Other (See Comments)    Will cause hemerage    Ciprofloxacin  Hives   Codeine Other (See Comments)    Other Reaction: GI Upset   Metronidazole Hives   Penicillins Shortness Of Breath   Sulfa Antibiotics Shortness Of Breath   Clindamycin Hcl Other (See Comments)   Nitrofurantoin Monohyd Macro Other (See Comments) and Nausea Only   Other Other (See Comments)    Uncoded Allergy. Allergen: flu shot, Other Reaction: Other reaction   Povidone Iodine Other (See Comments)    Other Reaction: Other reaction     Subjective: pt reports no problems or concerns, no SOB, no CP, normal appetite, no concerns w/ defecation/urination, no pain.    Discharge Exam: BP 102/74 (BP Location: Left Arm)   Pulse 86   Temp 98.1 F (36.7 C)   Resp 16   Ht 5\' 3"  (1.6 m)   Wt 72.6 kg   SpO2 92%   BMI 28.35 kg/m  General: Pt is alert, awake, not in acute distress Cardiovascular: RRR, S1/S2 +, no rubs, no gallops Respiratory: CTA bilaterally, no wheezing, no rhonchi Abdominal: Soft, NT, ND, bowel sounds + Extremities: no edema, no cyanosis     The results of significant diagnostics from this hospitalization (including imaging, microbiology, ancillary and laboratory) are listed below for reference.     Microbiology: No results found for this or any previous visit (from the past 240 hour(s)).   Labs: BNP (last 3 results) Recent Labs    01/16/22 2358  BNP 657.8*   Basic Metabolic Panel: Recent Labs  Lab 01/23/22 0453 01/29/22 0417  NA 138 142  K 4.2 3.8  CL 108 107  CO2 27 29  GLUCOSE 93 86  BUN 25* 19  CREATININE 0.79 0.71  CALCIUM 8.7* 9.0   Liver Function Tests: No results for input(s): "AST", "ALT", "ALKPHOS", "BILITOT", "PROT", "ALBUMIN" in the last 168 hours. No results for input(s): "LIPASE", "AMYLASE" in the last 168 hours. No results for input(s): "AMMONIA" in the last 168 hours. CBC: Recent Labs  Lab 01/23/22 0453 01/29/22 0417  WBC 8.2 5.9  HGB 13.3 13.7  HCT 40.5 41.3  MCV 95.1 95.2  PLT 176 147*   Cardiac Enzymes: No  results for input(s): "CKTOTAL", "CKMB", "CKMBINDEX", "TROPONINI" in the last 168 hours. BNP: Invalid input(s): "POCBNP" CBG: No results for input(s): "GLUCAP" in the last 168 hours. D-Dimer No results for input(s): "DDIMER" in the last 72 hours.  Hgb A1c No results for input(s): "HGBA1C" in the last 72 hours. Lipid Profile No results for input(s): "CHOL", "HDL", "LDLCALC", "TRIG", "CHOLHDL", "LDLDIRECT" in the last 72 hours. Thyroid function studies No results for input(s): "TSH", "T4TOTAL", "T3FREE", "THYROIDAB" in the last 72 hours.  Invalid input(s): "FREET3" Anemia work up No results for input(s): "VITAMINB12", "FOLATE", "FERRITIN", "TIBC", "IRON", "RETICCTPCT" in the last 72 hours. Urinalysis    Component Value Date/Time   COLORURINE AMBER (A) 01/16/2022 2355   APPEARANCEUR CLOUDY (A) 01/16/2022 2355   APPEARANCEUR Clear 04/11/2015 1339   LABSPEC 1.011 01/16/2022 2355   PHURINE 8.0 01/16/2022 2355   GLUCOSEU NEGATIVE 01/16/2022 2355   HGBUR NEGATIVE 01/16/2022 2355   BILIRUBINUR NEGATIVE 01/16/2022 2355   BILIRUBINUR Negative 04/11/2015 1339   KETONESUR NEGATIVE 01/16/2022 2355   PROTEINUR 30 (A) 01/16/2022 2355   NITRITE NEGATIVE 01/16/2022 2355   LEUKOCYTESUR MODERATE (A) 01/16/2022 2355   Sepsis Labs Recent Labs  Lab 01/23/22 0453 01/29/22 0417  WBC 8.2 5.9   Microbiology No results found for this or any previous visit (from the past 240 hour(s)). Imaging CT T-SPINE NO CHARGE  Result Date: 01/17/2022 CLINICAL DATA:  Frequent falls, found down EXAM: CT THORACIC AND LUMBAR SPINE WITHOUT CONTRAST TECHNIQUE: Multidetector CT imaging of the thoracic and lumbar spine was performed without contrast. Multiplanar CT image reconstructions were also generated. RADIATION DOSE REDUCTION: This exam was performed according to the departmental dose-optimization program which includes automated exposure control, adjustment of the mA and/or kV according to patient size and/or use  of iterative reconstruction technique. COMPARISON:  09/02/2021 CT T and L-spine; correlation is also made with CT cervical spine 12/27/2021 FINDINGS: CT THORACIC SPINE FINDINGS Alignment: No traumatic listhesis. Exaggeration of the normal thoracic kyphosis. Scoliosis. Vertebrae: Osteopenia. Unchanged remote compression deformities T3 and T4. 10% vertebral body height loss and increased density of the superior aspect of T2, which appears unchanged compared to the 12/27/2021 CT cervical spine. Unchanged remote T2 spinous process fracture (series 9, image 56) no definite acute fracture. Paraspinal and other soft tissues: Please see same-day CT chest Disc levels: Multilevel degenerative disc disease without significant spinal canal stenosis. CT LUMBAR SPINE FINDINGS Segmentation: 5 lumbar-type vertebral bodies. Alignment: Exaggeration of the normal lumbar lordosis. Lumbar levoscoliosis. 3 mm anterolisthesis of T12 on L1. 3 mm retrolisthesis of L1 on L2. 3 mm anterolisthesis of L3 on L4, L4 on L5, and L5 on S1. Vertebrae: No acute fracture or suspicious osseous lesion. Redemonstrated remote L1 compression fracture. Vertebral body heights are otherwise preserved. Paraspinal and other soft tissues: Aortic atherosclerosis. Disc levels: T12-L1: Disc height loss and grade 1 anterolisthesis. No significant spinal canal stenosis or neural foraminal narrowing. L1-L2: Disc height loss and moderate disc bulge with annular calcification. Mild facet arthropathy. Moderate spinal canal stenosis and severe right and moderate left neural foraminal narrowing. L2-L3: Mild disc bulge. Mild facet arthropathy. Ligamentum flavum hypertrophy. Moderate spinal canal stenosis. No neural foraminal narrowing. L3-L4: Trace anterolisthesis disc unroofing and moderate disc bulge. Severe facet arthropathy. Ligamentum flavum hypertrophy. Severe spinal canal stenosis. Severe left neural foraminal narrowing. L4-L5: Anterolisthesis and disc unroofing with  moderate right eccentric disc bulge. Severe facet arthropathy. Ligamentum flavum hypertrophy. Mild spinal canal stenosis. No neural foraminal narrowing. L5-S1: Anterolisthesis with disc unroofing. Severe facet arthropathy. Severe spinal canal stenosis. Moderate left neural foraminal narrowing. IMPRESSION: 1. No acute fracture or traumatic listhesis in the thoracic or lumbar spine. 2. Unchanged remote compression deformities of T2, T3, T4, and L1. 3. Multilevel degenerative  disc disease and spondylolisthesis, as described above. 4. Aortic atherosclerosis. Aortic Atherosclerosis (ICD10-I70.0). Electronically Signed   By: Merilyn Baba M.D.   On: 01/17/2022 01:56   CT Lumbar Spine Wo Contrast  Result Date: 01/17/2022 CLINICAL DATA:  Frequent falls, found down EXAM: CT THORACIC AND LUMBAR SPINE WITHOUT CONTRAST TECHNIQUE: Multidetector CT imaging of the thoracic and lumbar spine was performed without contrast. Multiplanar CT image reconstructions were also generated. RADIATION DOSE REDUCTION: This exam was performed according to the departmental dose-optimization program which includes automated exposure control, adjustment of the mA and/or kV according to patient size and/or use of iterative reconstruction technique. COMPARISON:  09/02/2021 CT T and L-spine; correlation is also made with CT cervical spine 12/27/2021 FINDINGS: CT THORACIC SPINE FINDINGS Alignment: No traumatic listhesis. Exaggeration of the normal thoracic kyphosis. Scoliosis. Vertebrae: Osteopenia. Unchanged remote compression deformities T3 and T4. 10% vertebral body height loss and increased density of the superior aspect of T2, which appears unchanged compared to the 12/27/2021 CT cervical spine. Unchanged remote T2 spinous process fracture (series 9, image 56) no definite acute fracture. Paraspinal and other soft tissues: Please see same-day CT chest Disc levels: Multilevel degenerative disc disease without significant spinal canal stenosis. CT  LUMBAR SPINE FINDINGS Segmentation: 5 lumbar-type vertebral bodies. Alignment: Exaggeration of the normal lumbar lordosis. Lumbar levoscoliosis. 3 mm anterolisthesis of T12 on L1. 3 mm retrolisthesis of L1 on L2. 3 mm anterolisthesis of L3 on L4, L4 on L5, and L5 on S1. Vertebrae: No acute fracture or suspicious osseous lesion. Redemonstrated remote L1 compression fracture. Vertebral body heights are otherwise preserved. Paraspinal and other soft tissues: Aortic atherosclerosis. Disc levels: T12-L1: Disc height loss and grade 1 anterolisthesis. No significant spinal canal stenosis or neural foraminal narrowing. L1-L2: Disc height loss and moderate disc bulge with annular calcification. Mild facet arthropathy. Moderate spinal canal stenosis and severe right and moderate left neural foraminal narrowing. L2-L3: Mild disc bulge. Mild facet arthropathy. Ligamentum flavum hypertrophy. Moderate spinal canal stenosis. No neural foraminal narrowing. L3-L4: Trace anterolisthesis disc unroofing and moderate disc bulge. Severe facet arthropathy. Ligamentum flavum hypertrophy. Severe spinal canal stenosis. Severe left neural foraminal narrowing. L4-L5: Anterolisthesis and disc unroofing with moderate right eccentric disc bulge. Severe facet arthropathy. Ligamentum flavum hypertrophy. Mild spinal canal stenosis. No neural foraminal narrowing. L5-S1: Anterolisthesis with disc unroofing. Severe facet arthropathy. Severe spinal canal stenosis. Moderate left neural foraminal narrowing. IMPRESSION: 1. No acute fracture or traumatic listhesis in the thoracic or lumbar spine. 2. Unchanged remote compression deformities of T2, T3, T4, and L1. 3. Multilevel degenerative disc disease and spondylolisthesis, as described above. 4. Aortic atherosclerosis. Aortic Atherosclerosis (ICD10-I70.0). Electronically Signed   By: Merilyn Baba M.D.   On: 01/17/2022 01:56   CT Angio Chest PE W and/or Wo Contrast  Result Date: 01/17/2022 CLINICAL  DATA:  88 year old female with history of DVT and PE on anticoagulation; found down at her facility EXAM: CT ANGIOGRAPHY CHEST WITH CONTRAST TECHNIQUE: Multidetector CT imaging of the chest was performed using the standard protocol during bolus administration of intravenous contrast. Multiplanar CT image reconstructions and MIPs were obtained to evaluate the vascular anatomy. RADIATION DOSE REDUCTION: This exam was performed according to the departmental dose-optimization program which includes automated exposure control, adjustment of the mA and/or kV according to patient size and/or use of iterative reconstruction technique. CONTRAST:  65mL OMNIPAQUE IOHEXOL 350 MG/ML SOLN COMPARISON:  Radiographs 01/16/2022 and CT chest 11/11/2012. FINDINGS: Cardiovascular: Satisfactory opacification of the pulmonary arteries to the segmental level. No  evidence of pulmonary embolism. Cardiomegaly. No pericardial effusion. Coronary artery and aortic atherosclerotic calcification. Mediastinum/Nodes: No enlarged mediastinal, hilar, or axillary lymph nodes. Thyroid gland, trachea, and esophagus demonstrate no significant findings. Lungs/Pleura: Respiratory motion obscures fine detail. Mild diffuse interlobular septal thickening. Bronchial wall thickening and mucous plugging in the left-greater-than-right lower lobes. Atelectasis or pneumonia in the left lower lobe and lingula. Upper Abdomen: No acute abnormality. Bilateral adrenal thickening which is slightly nodular on the left slightly increased from 2014 but likely representing benign hyperplasia/adenoma. No follow-up is recommended. Cholecystectomy. Musculoskeletal: Age indeterminate fracture of the proximal right clavicle which is minimally displaced as a acute to subacute appearance. Numerous subacute and chronic bilateral rib fractures. Marked compression fracture of T4. See separate report from CT of the thoracic spine for details. Review of the MIP images confirms the above  findings. IMPRESSION: 1. Negative for acute pulmonary embolism. 2. Patchy airspace opacities bilaterally greatest in the left lower lobe suspicious for pneumonia. 3. Cardiomegaly and interstitial thickening suggestive of pulmonary edema. 4. Acute or subacute proximal right clavicle fracture. 5. Subacute and chronic bilateral rib fractures. 6. Age indeterminate compression fracture of T4. See separate report for findings in the thoracolumbar spine. Electronically Signed   By: Placido Sou M.D.   On: 01/17/2022 01:52   CT HEAD WO CONTRAST (5MM)  Result Date: 01/16/2022 CLINICAL DATA:  Head and neck trauma after fall on anticoagulation; bruising to the right forehead EXAM: CT HEAD WITHOUT CONTRAST TECHNIQUE: Contiguous axial images were obtained from the base of the skull through the vertex without intravenous contrast. RADIATION DOSE REDUCTION: This exam was performed according to the departmental dose-optimization program which includes automated exposure control, adjustment of the mA and/or kV according to patient size and/or use of iterative reconstruction technique. COMPARISON:  CT head and C-spine 12/27/2021 FINDINGS: CT HEAD FINDINGS Brain: No intracranial hemorrhage, mass effect, or evidence of acute infarct. No hydrocephalus. No extra-axial fluid collection. Generalized cerebral atrophy. Ill-defined hypoattenuation within the cerebral white matter is nonspecific but consistent with chronic small vessel ischemic disease. Vascular: No hyperdense vessel. Intracranial arterial calcification. Skull: No fracture or focal lesion. Scalp hematoma over the right forehead. Sinuses/Orbits: Mucosal thickening with air-fluid levels in both maxillary sinuses. Mucosal thickening in the ethmoid air cells. Other: None. CT CERVICAL SPINE FINDINGS Alignment: Multiple low-grade vertebral body subluxations are similar to prior. Skull base and vertebrae: No acute fracture. Redemonstrated subacute superior endplate compression  fractures and sclerosis of T2 and partially visualized T3. Minimally displaced fracture through the T2 spinous process with signs of sclerosis about the fracture line is also unchanged. Soft tissues and spinal canal: No prevertebral fluid or swelling. No visible canal hematoma. Disc levels: Unchanged multilevel advanced spondylosis and facet arthropathy. Posterior disc osteophyte complexes cause multilevel moderate spinal canal narrowing. Uncovertebral spurring and facet arthropathy cause advanced neural foraminal narrowing bilaterally at C4-C5. Upper chest: Negative. Other: None. IMPRESSION: 1. No acute intracranial abnormality. Generalized atrophy and small vessel white matter disease. 2. No acute fracture in the cervical spine. Multilevel degenerative spondylosis. 3. Subacute compression fractures of T2 and T3 and mildly displaced subacute fracture of the T2 spinous process. 4. Right forehead scalp hematoma.  No calvarial fracture. 5. Mucosal thickening in the ethmoid and bilateral maxillary sinuses with air-fluid levels can be seen with acute sinusitis. Electronically Signed   By: Placido Sou M.D.   On: 01/16/2022 23:47   CT Cervical Spine Wo Contrast  Result Date: 01/16/2022 CLINICAL DATA:  Head and neck trauma after  fall on anticoagulation; bruising to the right forehead EXAM: CT HEAD WITHOUT CONTRAST TECHNIQUE: Contiguous axial images were obtained from the base of the skull through the vertex without intravenous contrast. RADIATION DOSE REDUCTION: This exam was performed according to the departmental dose-optimization program which includes automated exposure control, adjustment of the mA and/or kV according to patient size and/or use of iterative reconstruction technique. COMPARISON:  CT head and C-spine 12/27/2021 FINDINGS: CT HEAD FINDINGS Brain: No intracranial hemorrhage, mass effect, or evidence of acute infarct. No hydrocephalus. No extra-axial fluid collection. Generalized cerebral atrophy.  Ill-defined hypoattenuation within the cerebral white matter is nonspecific but consistent with chronic small vessel ischemic disease. Vascular: No hyperdense vessel. Intracranial arterial calcification. Skull: No fracture or focal lesion. Scalp hematoma over the right forehead. Sinuses/Orbits: Mucosal thickening with air-fluid levels in both maxillary sinuses. Mucosal thickening in the ethmoid air cells. Other: None. CT CERVICAL SPINE FINDINGS Alignment: Multiple low-grade vertebral body subluxations are similar to prior. Skull base and vertebrae: No acute fracture. Redemonstrated subacute superior endplate compression fractures and sclerosis of T2 and partially visualized T3. Minimally displaced fracture through the T2 spinous process with signs of sclerosis about the fracture line is also unchanged. Soft tissues and spinal canal: No prevertebral fluid or swelling. No visible canal hematoma. Disc levels: Unchanged multilevel advanced spondylosis and facet arthropathy. Posterior disc osteophyte complexes cause multilevel moderate spinal canal narrowing. Uncovertebral spurring and facet arthropathy cause advanced neural foraminal narrowing bilaterally at C4-C5. Upper chest: Negative. Other: None. IMPRESSION: 1. No acute intracranial abnormality. Generalized atrophy and small vessel white matter disease. 2. No acute fracture in the cervical spine. Multilevel degenerative spondylosis. 3. Subacute compression fractures of T2 and T3 and mildly displaced subacute fracture of the T2 spinous process. 4. Right forehead scalp hematoma.  No calvarial fracture. 5. Mucosal thickening in the ethmoid and bilateral maxillary sinuses with air-fluid levels can be seen with acute sinusitis. Electronically Signed   By: Placido Sou M.D.   On: 01/16/2022 23:47   DG Chest 1 View  Result Date: 01/16/2022 CLINICAL DATA:  Chest pain EXAM: CHEST  1 VIEW COMPARISON:  12/09/2021, CT 11/11/2012 FINDINGS: Mild cardiomegaly. Mild diffuse  reticular interstitial opacity. Probable subsegmental atelectasis at the bases. No pneumothorax. Aortic atherosclerosis. Old right clavicle fracture. IMPRESSION: 1. Mild diffuse reticular interstitial opacity suggestive of underlying chronic disease. There is probable subsegmental atelectasis at the bases 2. Cardiomegaly Electronically Signed   By: Donavan Foil M.D.   On: 01/16/2022 23:41      Time coordinating discharge: over 30 minutes  SIGNED:  Emeterio Reeve DO [facility]

## 2022-01-29 NOTE — Plan of Care (Signed)
Patient discharged per MD orders at this time.All dc instructions, education and medications reviewed with the patient.Pt expressed understanding & will comply with dc instructions.f/u appointments was also communicated to the patient.no verbal c/o or any ssx of distress.Pt was discharged to the [facility], for PT/OT services per order.report was called to staff nurse,Chrystal before transport.Pt was transported by 2 [facility] personnel on a stretcher.

## 2022-01-29 NOTE — Progress Notes (Signed)
Nutrition Follow-up  DOCUMENTATION CODES:   Not applicable  INTERVENTION:   -Continue with regular diet -Continue MVI with minerals daily -Continue feeding assistance with meals -D/c Ensure Enlive po TID, each supplement provides 350 kcal and 20 grams of protein -Boost Breeze po TID, each supplement provides 250 kcal and 9 grams of protein  -Magic cup TID with meals, each supplement provides 290 kcal and 9 grams of protein   NUTRITION DIAGNOSIS:   Inadequate oral intake related to poor appetite as evidenced by meal completion < 50%.  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  Reviewed I/O's: -600 ml x 24 hours and -3 L since admission  UOP: 600 ml x 24 hours   Pt unavailable at time of visit. Attempted to speak with pt via call to hospital room phone, however, unable to reach.    Pt continues on a regular diet. No meal completion data available to assess at this time. Feeding assistance with meals has been ordered. Pt has been refusing Ensure supplements.   Per TOC notes, pt awaiting insurance authorization for SNF placement.   Medications reviewed and include miralax and senokot.  Labs reviewed.   Diet Order:   Diet Order             Diet regular Room service appropriate? Yes; Fluid consistency: Thin  Diet effective now                   EDUCATION NEEDS:   Education needs have been addressed  Skin:  Skin Assessment: Skin Integrity Issues: Skin Integrity Issues:: Other (Comment) Other: skin breakdown to buttocks  Last BM:  01/29/22 (type 6)  Height:   Ht Readings from Last 1  Encounters:  01/17/22 5\' 3"  (1.6 m)    Weight:   Wt Readings from Last 1 Encounters:  01/17/22 72.6 kg    Ideal Body Weight:  52.3 kg  BMI:  Body mass index is 28.35 kg/m.  Estimated Nutritional Needs:   Kcal:  1550-1750  Protein:  80-95 grams  Fluid:  > 1.5 L    Loistine Chance, RD, LDN, [place] Registered Dietitian II Certified Diabetes Care and Education Specialist Please refer to [facility] for RD and/or RD on-call/weekend/after hours pager

## 2022-01-29 NOTE — Progress Notes (Signed)
Occupational Therapy Treatment Patient Details Name: Jane Perez MRN: [id number] DOB: [birth year] Today's Date: 01/29/2022   History of present illness Jane Perez is an 88 y.o. 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 Admitted  on 01/16/2022 with fall and found to be COVID + with PNA. Has frequent falls as demonstrated by multiple healed fractures on admission imaging including remote T2-4 and L1 compression fractures.   OT comments  Jane Perez was seen for OT treatment on this date. Upon arrival to room pt reclined in bed, pleasant agreeable to tx. Pt requires MAX A for LB access at bed level. MOD A + RW for ~10 stnading trials from elevated bed height and takes 1 side step along bed. Pt making good progress toward goals, will continue to follow POC. Discharge recommendation remains appropriate.     Recommendations for follow up therapy are one component of a multi-disciplinary discharge planning process, led by the attending physician.  Recommendations may be updated based on patient status, additional functional criteria and insurance authorization.    Follow Up Recommendations  Skilled nursing-short term rehab (<3 hours/day)     Assistance Recommended at Discharge Frequent or constant Supervision/Assistance  Patient can return home with the following  Two people to help with walking and/or transfers;Two people to help with bathing/dressing/bathroom   Equipment Recommendations  Other (comment) (defer)    Recommendations for Other Services      Precautions / Restrictions Precautions Precautions: Fall Restrictions Weight Bearing Restrictions: No       Mobility Bed Mobility Overal bed mobility: Needs Assistance Bed Mobility: Supine to Sit, Sit to Supine     Supine to sit: Mod assist Sit to  supine: Mod assist   General bed mobility comments: BLE assist and initiation    Transfers Overall transfer level: Needs assistance Equipment used: Rolling walker (2 wheels) Transfers: Sit to/from Stand Sit to Stand: Mod assist, From elevated surface           General transfer comment: able to follow commands and stand x 3 reps each for 45 seconds. Cues for weight shifting and upright posture.     Balance Overall balance assessment: History of Falls, Needs assistance Sitting-balance support: Single extremity supported, Feet supported Sitting balance-Leahy Scale: Fair     Standing balance support: Bilateral upper extremity supported, Reliant on assistive device for balance Standing balance-Leahy Scale: Poor                             ADL either performed or assessed with clinical judgement   ADL Overall ADL's : Needs assistance/impaired                                       General ADL Comments: MAX A for LB access at bed level. MOD A + RW for ~10 stnading trials from elevated bed height, requires BUE support unable to trial standing groomin tasks. MIN cues for seated grooming tasks     Cognition Arousal/Alertness: Awake/alert Behavior During Therapy: WFL for tasks assessed/performed Overall Cognitive Status: No family/caregiver present to determine baseline cognitive functioning  General Comments: follows commands with time, alert to self and place                   Pertinent Vitals/ Pain       Pain Assessment Pain Assessment: No/denies pain   Frequency  Min 2X/week        Progress Toward Goals  OT Goals(current goals can now be found in the care plan section)  Progress towards OT goals: Progressing toward goals  Acute Rehab OT Goals Patient Stated Goal: to walk OT Goal Formulation: With patient Time For Goal Achievement: 02/04/22 Potential to Achieve Goals: Fair ADL  Goals Pt Will Perform Grooming: with set-up;with supervision;sitting Pt Will Perform Lower Body Dressing: with min assist;sitting/lateral leans Pt Will Transfer to Toilet: with mod assist;squat pivot transfer;bedside commode  Plan Discharge plan remains appropriate;Frequency remains appropriate    Co-evaluation                 AM-PAC OT "6 Clicks" Daily Activity     Outcome Measure   Help from another person eating meals?: A Little Help from another person taking care of personal grooming?: A Little Help from another person toileting, which includes using toliet, bedpan, or urinal?: A Lot Help from another person bathing (including washing, rinsing, drying)?: A Lot Help from another person to put on and taking off regular upper body clothing?: A Little Help from another person to put on and taking off regular lower body clothing?: A Lot 6 Click Score: 15    End of Session    OT Visit Diagnosis: Other abnormalities of gait and mobility (R26.89);Muscle weakness (generalized) (M62.81)   Activity Tolerance Patient tolerated treatment well   Patient Left in bed;with call bell/phone within reach;with bed alarm set   Nurse Communication          Time: 0165-5374 OT Time Calculation (min): 14 min  Charges: OT General Charges $OT Visit: 1 Visit OT Treatments $Therapeutic Activity: 8-22 mins  Dessie Coma, M.S. OTR/L  01/29/22, 10:27 AM  ascom [phone]

## 2022-03-04 IMAGING — CT CT CERVICAL SPINE W/O CM
3 of 4 series · 12 of 33 positions shown, 14 images · non-contrast
Comparison: CT head 02/17/2014

CLINICAL DATA: Facial trauma, mechanical fall while walking with
walker, fell forward striking head, denies loss of consciousness,
RIGHT supraorbital laceration. History bladder cancer, hypertension,
lung cancer

EXAM:
CT HEAD WITHOUT CONTRAST
CT MAXILLOFACIAL WITHOUT CONTRAST
CT CERVICAL SPINE WITHOUT CONTRAST
TECHNIQUE: Multidetector CT imaging of the head, cervical spine, and
maxillofacial structures were performed using the standard protocol
without intravenous contrast. Multiplanar CT image reconstructions
of the cervical spine and maxillofacial structures were also
generated. Right side of face marked with BB.

[Series 4: sagittal bone · sagittal · 0.26mm/px · 5 of 63 slices shown, 6 images]
[im 21/63  bone]
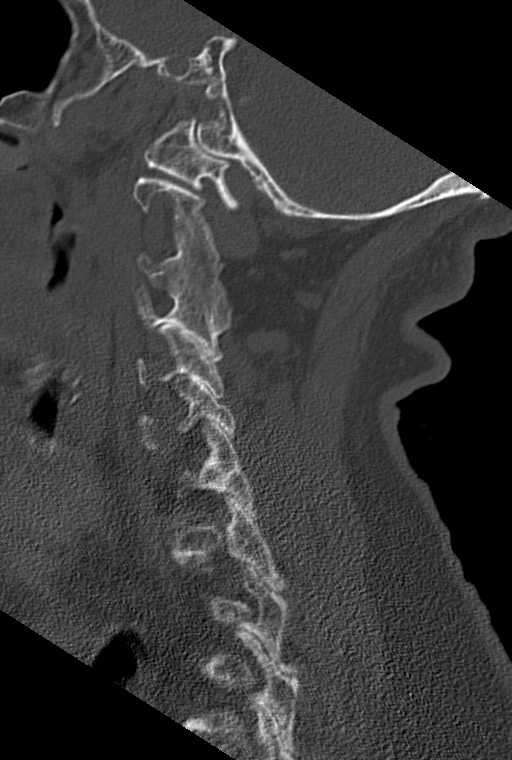
[im 26/63  bone]
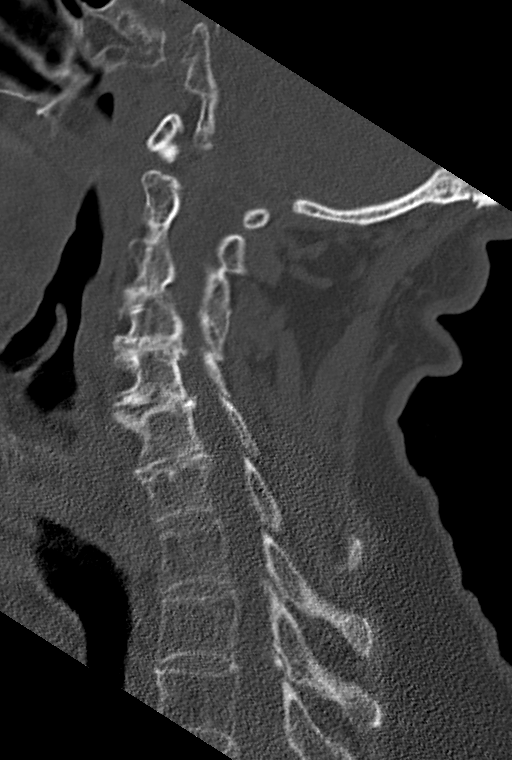
[im 32/63  soft-tissue]
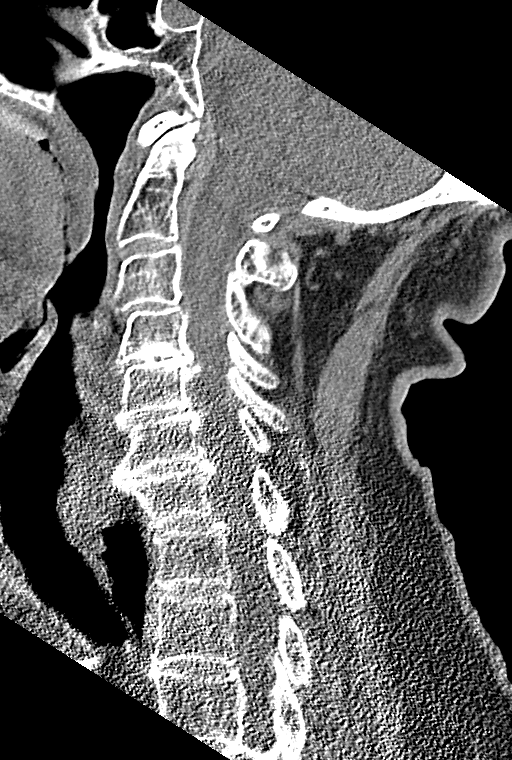
[im 32/63  bone]
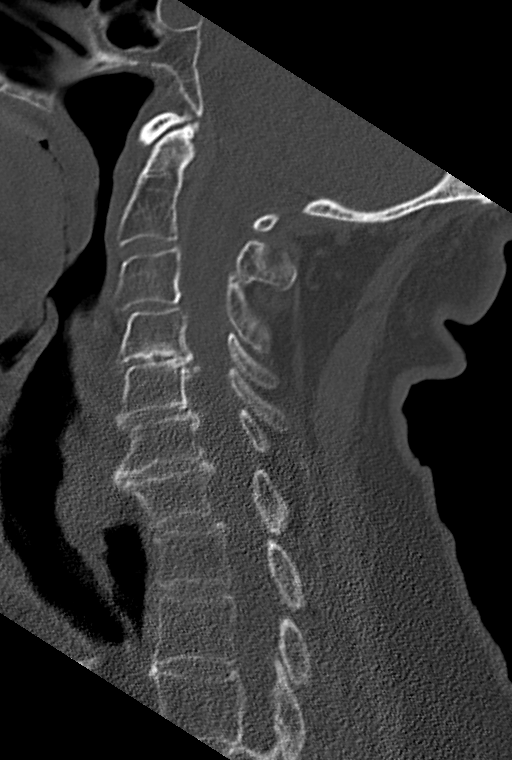
[im 37/63  bone]
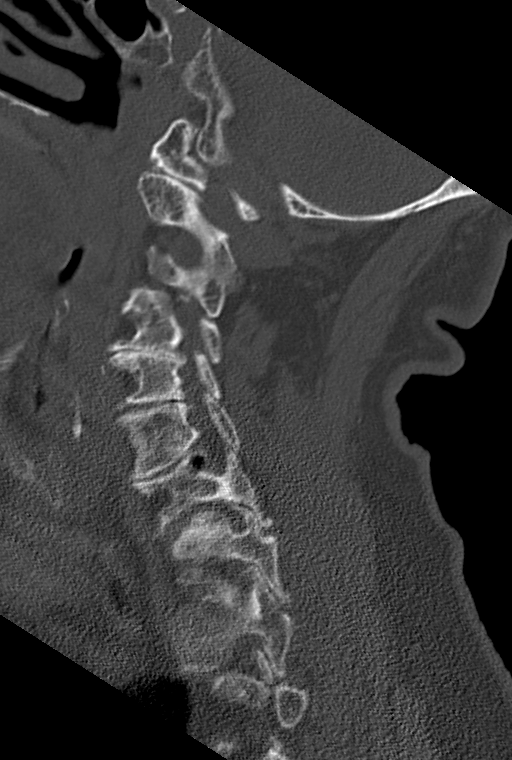
[im 42/63  bone]
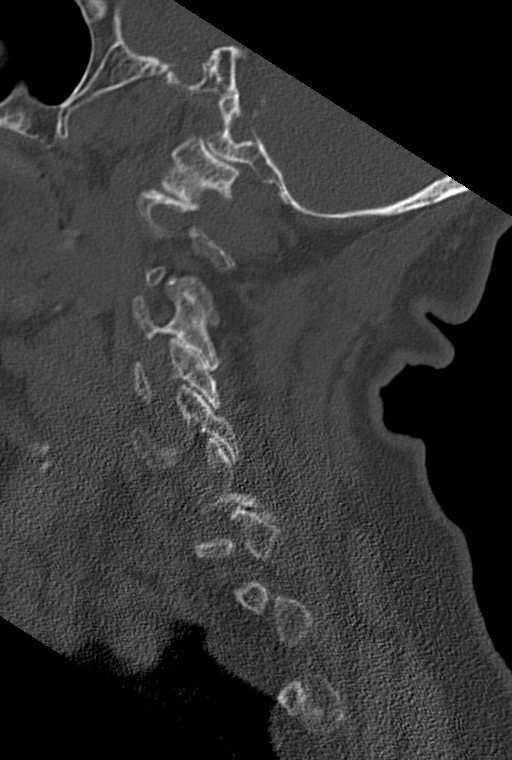

[Series 5: coronal bone · coronal · 0.24mm/px · 3 of 67 slices shown]
[im 20/67  bone]
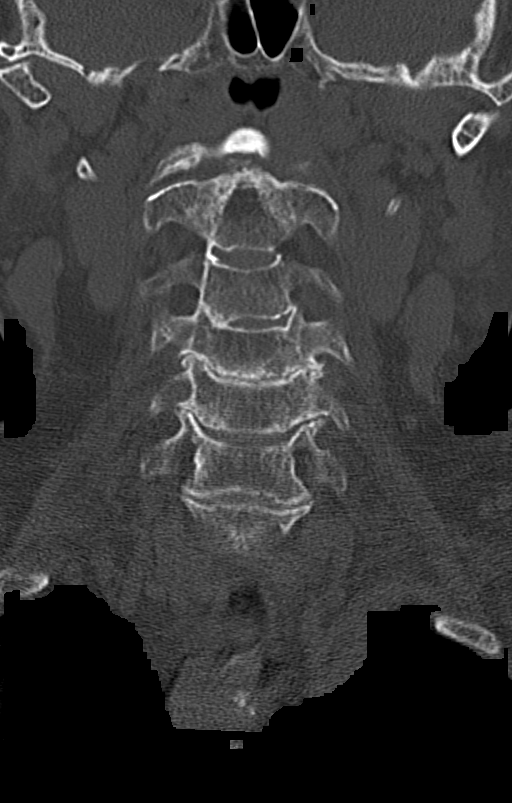
[im 29/67  bone]
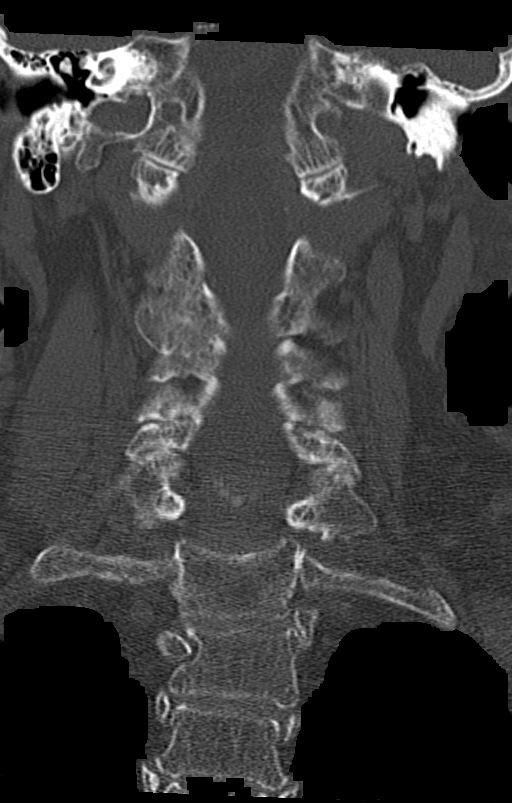
[im 38/67  bone]
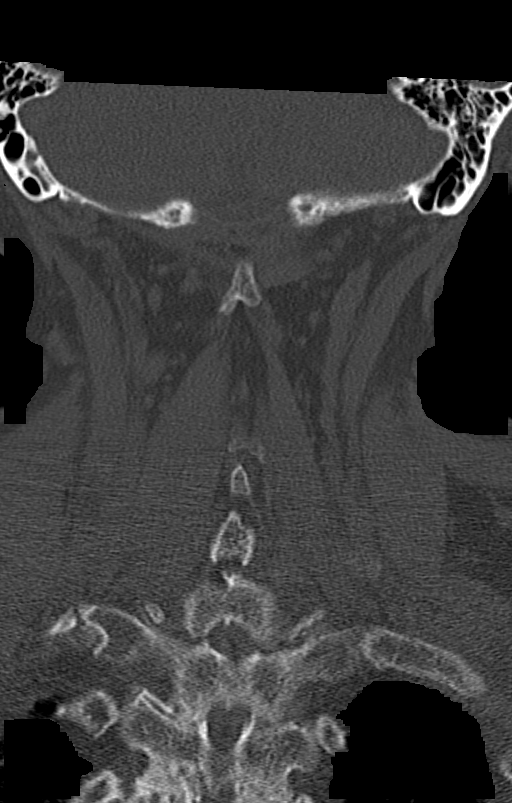

[Series 6: orthogonal axials · axial · 0.24mm/px · z∈[-285,-167]mm · 4 of 99 slices shown, 5 images]
[im 15/99  soft-tissue]
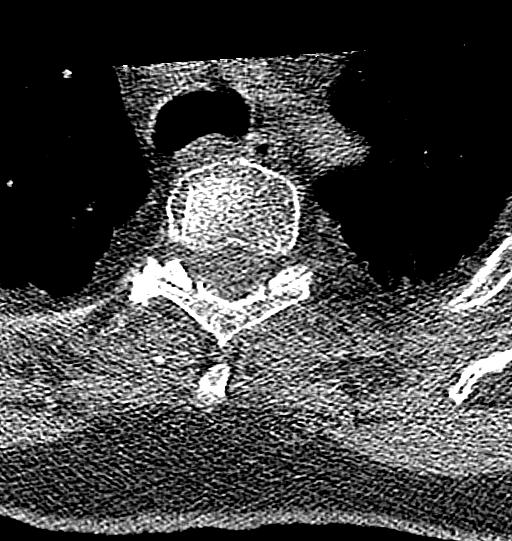
[im 15/99  bone]
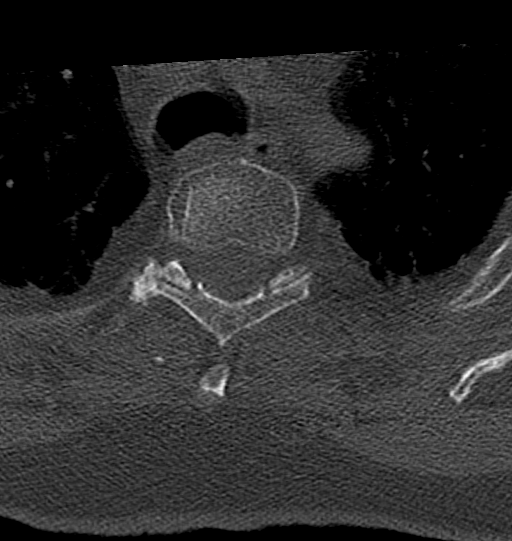
[im 43/99  bone]
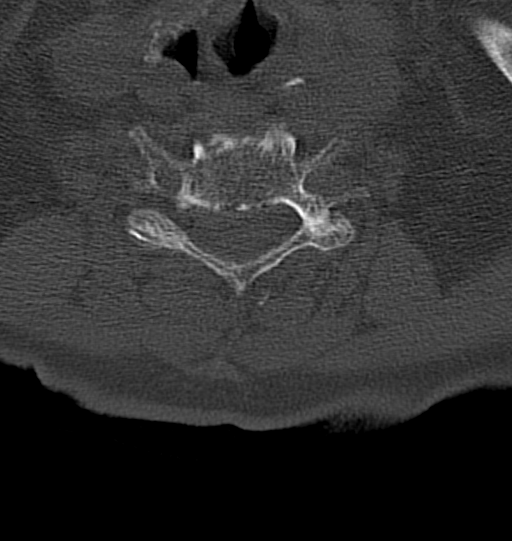
[im 57/99  bone]
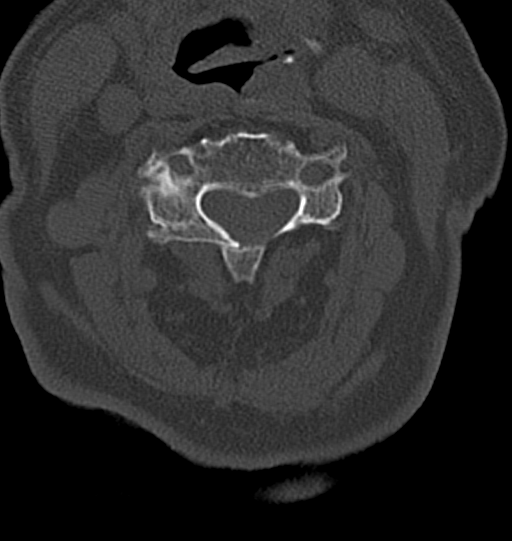
[im 85/99  bone]
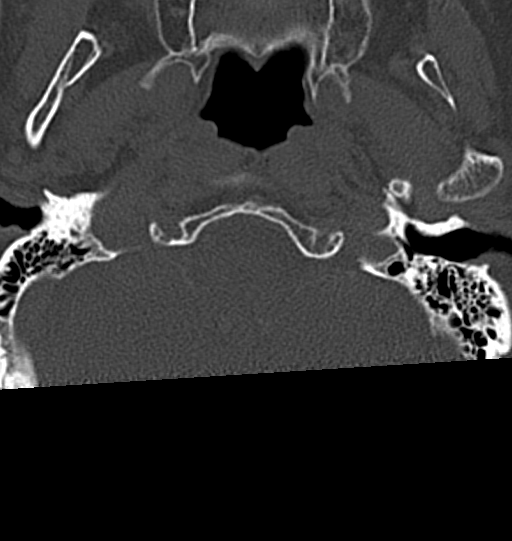

[12 of 33 positions shown; findings below may reference images not displayed]

FINDINGS: CT HEAD FINDINGS

Brain: Generalized atrophy. Normal ventricular morphology. No
midline shift or mass effect. Small vessel chronic ischemic changes
of deep cerebral white matter. No intracranial hemorrhage, mass
lesion or evidence of acute infarction. No extra-axial fluid
collections.

Vascular: Atherosclerotic calcification of internal carotid arteries
at skull base. No hyperdense vessels.

Skull: Demineralized but intact

Other: N/A

CT MAXILLOFACIAL FINDINGS

Osseous: Osseous demineralization. TMJ alignment normal bilaterally.
Facial bones intact.

Orbits: Bony orbits intact.  Intraorbital soft tissue planes clear.

Sinuses: Small amount of mucus within RIGHT sphenoid sinus.
Remaining paranasal sinuses, mastoid air cells, and middle ear
cavities clear

Soft tissues: Minimal RIGHT supraorbital scalp soft tissue swelling.
Soft tissues otherwise unremarkable.

CT CERVICAL SPINE FINDINGS

Alignment: Minimal anterolisthesis at C7-T1 and C5-C6. Remaining
alignments normal.

Skull base and vertebrae: Osseous demineralization. Skull base
intact. Multilevel facet degenerative changes. Disc space narrowing
and endplate spur formation throughout cervical spine. Vertebral
body heights maintained without fracture or bone destruction.

Soft tissues and spinal canal: Prevertebral soft tissues normal
thickness. Soft tissues otherwise unremarkable.

Disc levels:  No additional abnormalities

Upper chest: Lung apices clear

Other: N/A
IMPRESSION: Atrophy with small vessel chronic ischemic changes of deep cerebral
white matter.

No acute intracranial abnormalities.

No acute facial bone abnormalities.

Multilevel degenerative disc and facet disease changes of the
cervical spine.

No acute cervical spine abnormalities.

## 2022-07-08 ENCOUNTER — Other Ambulatory Visit (INDEPENDENT_AMBULATORY_CARE_PROVIDER_SITE_OTHER): Payer: Self-pay | Admitting: Nurse Practitioner

## 2022-07-08 DIAGNOSIS — S81809S Unspecified open wound, unspecified lower leg, sequela: Secondary | ICD-10-CM

## 2022-07-11 ENCOUNTER — Ambulatory Visit (INDEPENDENT_AMBULATORY_CARE_PROVIDER_SITE_OTHER): Payer: 59 | Admitting: Nurse Practitioner

## 2022-07-11 ENCOUNTER — Encounter (INDEPENDENT_AMBULATORY_CARE_PROVIDER_SITE_OTHER): Payer: Self-pay | Admitting: Nurse Practitioner

## 2022-07-11 ENCOUNTER — Ambulatory Visit (INDEPENDENT_AMBULATORY_CARE_PROVIDER_SITE_OTHER): Payer: 59

## 2022-07-11 VITALS — BP 109/67 | HR 89 | Resp 16

## 2022-07-11 DIAGNOSIS — S81809S Unspecified open wound, unspecified lower leg, sequela: Secondary | ICD-10-CM

## 2022-07-11 DIAGNOSIS — S81809A Unspecified open wound, unspecified lower leg, initial encounter: Secondary | ICD-10-CM

## 2022-07-11 LAB — VAS US ABI WITH/WO TBI
Left ABI: 1.46
Right ABI: 1.24

## 2022-07-30 ENCOUNTER — Encounter (INDEPENDENT_AMBULATORY_CARE_PROVIDER_SITE_OTHER): Payer: Self-pay | Admitting: Nurse Practitioner

## 2022-07-30 NOTE — Progress Notes (Signed)
Subjective:    Patient ID: Jane Perez, female    DOB: 1930-07-14, 89 y.o.   MRN: [id number] Chief Complaint  Patient presents with   New Patient (Initial Visit)    Ref Jane Perez consult delayed healing bilateral heel wounds    Jane Perez is a 89 year old female who presents today as a referral from [place], NP in regards to slow healing bilateral heel ulcerations.  The patient has been working with wound care at her facility.  Despite this the there was concern that due to not feeling strongly palpable pulses the patient may have issues with circulation preventing her wound healing.  The patient does not walk frequently so she denies claudication but she also denies any rest pain either.  Today noninvasive studies show a right ABI of 1.24.46.  The patient has normal TBI's bilaterally with good triphasic tibial artery waveforms bilaterally with normal toe waveforms bilaterally.    Review of Systems  Skin:  Positive for wound.  All other systems reviewed and are negative.      Objective:   Physical Exam Vitals reviewed.  HENT:     Head: Normocephalic.  Cardiovascular:     Rate and Rhythm: Normal rate.     Pulses:          Dorsalis pedis pulses are detected w/ Doppler on the right side and detected w/ Doppler on the left side.       Posterior tibial pulses are detected w/ Doppler on the right side and detected w/ Doppler on the left side.  Pulmonary:     Effort: Pulmonary effort is normal.  Skin:    General: Skin is warm and dry.  Neurological:     Mental Status: She is alert and oriented to person, place, and time.  Psychiatric:        Mood and Affect: Mood normal.        Behavior: Behavior normal.        Thought Content: Thought content normal.        Judgment: Judgment normal.     BP 109/67 (BP Location: Right Arm)   Pulse 89   Resp 16   Past Medical History:  Diagnosis Date   Asthma    Bladder cancer (HCC)    Colitis    DVT (deep venous thrombosis) (HCC)     Endometriosis    Heart murmur    HTN (hypertension)    Kidney disease    Liver disease    Lung cancer (HCC)    Pulmonary thrombosis (HCC)     Social History   Socioeconomic History   Marital status: Single    Spouse name: Not on file   Number of children: Not on file   Years of education: Not on file   Highest education level: Not on file  Occupational History   Not on file  Tobacco Use   Smoking status: Never   Smokeless tobacco: Not on file  Substance and Sexual Activity   Alcohol use: No    Alcohol/week: 0.0 standard drinks of alcohol   Drug use: No   Sexual activity: Not on file  Other Topics Concern   Not on file  Social History Narrative   Not on file   Social Determinants of Health   Financial Resource Strain: Not on file  Food Insecurity: Not on file  Transportation Needs: Not on file  Physical Activity: Not on file  Stress: Not on file  Social Connections: Not on file  Intimate  Partner Violence: Not on file    Past Surgical History:  Procedure Laterality Date   ABDOMINAL HYSTERECTOMY     Bladder cancer surgery     Endometrial surgery     LUNG REMOVAL, PARTIAL Right    SMALL INTESTINE SURGERY     x 2    Family History  Problem Relation Age of Onset   Kidney disease Brother    Bladder Cancer Neg Hx    Kidney disease Mother    Lung cancer Father    Colon cancer Mother    Hypertension Other        multiple family members    Allergies  Allergen Reactions   Aspirin Other (See Comments)    Will cause hemerage    Ciprofloxacin Hives   Codeine Other (See Comments)    Other Reaction: GI Upset   Metronidazole Hives   Penicillins Shortness Of Breath   Sulfa Antibiotics Shortness Of Breath   Clindamycin Hcl Other (See Comments)   Nitrofurantoin Monohyd Macro Other (See Comments) and Nausea Only   Other Other (See Comments)    Uncoded Allergy. Allergen: flu shot, Other Reaction: Other reaction   Povidone Iodine Other (See Comments)     Other Reaction: Other reaction       Latest Ref Rng & Units 01/29/2022    4:17 AM 01/23/2022    4:53 AM 01/19/2022    5:27 AM  CBC  WBC 4.0 - 10.5 K/uL 5.9  8.2  8.2   Hemoglobin 12.0 - 15.0 g/dL 16.1  09.6  04.5   Hematocrit 36.0 - 46.0 % 41.3  40.5  35.8   Platelets 150 - 400 K/uL 147  176  198       CMP     Component Value Date/Time   NA 142 01/29/2022 0417   K 3.8 01/29/2022 0417   CL 107 01/29/2022 0417   CO2 29 01/29/2022 0417   GLUCOSE 86 01/29/2022 0417   BUN 19 01/29/2022 0417   CREATININE 0.71 01/29/2022 0417   CALCIUM 9.0 01/29/2022 0417   PROT 5.6 (L) 01/18/2022 0651   ALBUMIN 2.2 (L) 01/18/2022 0651   AST 25 01/18/2022 0651   ALT 18 01/18/2022 0651   ALKPHOS 86 01/18/2022 0651   BILITOT 0.9 01/18/2022 0651   GFRNONAA >60 01/29/2022 0417     VAS [place] ABI WITH/WO TBI  Result Date: 07/11/2022  LOWER EXTREMITY DOPPLER STUDY Patient Name:  Jane Perez  Date of Exam:   07/11/2022 Medical Rec #: [id number]     Accession #:    [id number] Date of Birth: 1931/01/25     Patient Gender: F Patient Age:   46 years Exam Location:  [facility] Procedure:      VAS [place] ABI WITH/WO TBI Referring Phys: Jane Perez --------------------------------------------------------------------------------  Indications: Ulceration.  Performing Technologist: Debbe Bales RVS  Examination Guidelines: A complete evaluation includes at minimum, Doppler waveform signals and systolic blood pressure reading at the level of bilateral brachial, anterior tibial, and posterior tibial arteries, when vessel segments are accessible. Bilateral testing is considered an integral part of a complete examination. Photoelectric Plethysmograph (PPG) waveforms and toe systolic pressure readings are included as required and additional duplex testing as needed. Limited examinations for reoccurring indications may be performed as noted.  ABI Findings: +---------+------------------+-----+---------+--------+  Right    Rt Pressure (mmHg)IndexWaveform Comment  +---------+------------------+-----+---------+--------+ Brachial 119                                      +---------+------------------+-----+---------+--------+  ATA      139               1.05 triphasic         +---------+------------------+-----+---------+--------+ PTA      164               1.24 triphasic         +---------+------------------+-----+---------+--------+ Great Toe150               1.14 Normal            +---------+------------------+-----+---------+--------+ +---------+------------------+-----+---------+-------+ Left     Lt Pressure (mmHg)IndexWaveform Comment +---------+------------------+-----+---------+-------+ Brachial 132                                     +---------+------------------+-----+---------+-------+ ATA      139               1.05 triphasic        +---------+------------------+-----+---------+-------+ PTA      193               1.46 triphasic        +---------+------------------+-----+---------+-------+ Great Toe126               0.95 Normal           +---------+------------------+-----+---------+-------+ +-------+-----------+-----------+------------+------------+ ABI/TBIToday's ABIToday's TBIPrevious ABIPrevious TBI +-------+-----------+-----------+------------+------------+ Right  1.24       1.14                                +-------+-----------+-----------+------------+------------+ Left   1.46       .95                                 +-------+-----------+-----------+------------+------------+  Summary: Right: Resting right ankle-brachial index is within normal range. The right toe-brachial index is normal. Left: Resting left ankle-brachial index is within normal range. The left toe-brachial index is normal. *See table(s) above for measurements and observations.  Electronically signed by Jane Dredge MD on 07/11/2022 at 3:23:32 PM.    Final         Assessment & Plan:   1. Non-healing wound of lower extremity, unspecified laterality, initial encounter Based on noninvasive studies today the patient should have adequate perfusion for wound healing ability.  Wounds on the bilateral heels are notoriously difficult to heal based on the location.  Would recommend formal wound care consult if not been obtained.  Patient will follow-up with [place] on an as-needed basis.  Current Outpatient Medications on File Prior to Visit  Medication Sig Dispense Refill   acetaminophen (TYLENOL) 325 MG tablet Take 2 tablets (650 mg total) by mouth every 6 (six) hours as needed for mild pain or headache (fever >/= 101).     albuterol (VENTOLIN HFA) 108 (90 Base) MCG/ACT inhaler Inhale 2 puffs into the lungs every 6 (six) hours as needed for wheezing or shortness of breath. 18 g 0   apixaban (ELIQUIS) 5 MG TABS tablet Take 5 mg by mouth 2 (two) times daily.     bisacodyl (DULCOLAX) 10 MG suppository Place 1 suppository (10 mg total) rectally daily as needed for severe constipation. 12 suppository 0   Cranberry 500 MG TABS Take 500 mg by mouth daily.     furosemide (LASIX) 20 MG tablet Take 1 tablet (20  mg total) by mouth daily as needed for fluid or edema. 30 tablet 0   gabapentin (NEURONTIN) 100 MG capsule Take 100 mg by mouth at bedtime.     Lactobacillus Acid-Pectin (ACIDOPHILUS/PECTIN) CAPS Take 1 capsule by mouth daily.     megestrol (MEGACE) 40 MG tablet Take 40 mg by mouth daily.     Multiple Vitamin (MULTI-VITAMINS) TABS Take 1 tablet by mouth daily.     polyethylene glycol (MIRALAX / GLYCOLAX) 17 g packet Take 17 g by mouth daily. 14 each 0   senna-docusate (SENOKOT-S) 8.6-50 MG tablet Take 2 tablets by mouth at bedtime as needed for mild constipation or moderate constipation.     traZODone (DESYREL) 50 MG tablet Take 50 mg by mouth at bedtime.     vitamin B-12 (CYANOCOBALAMIN) 1000 MCG tablet Take 1,000 mcg by mouth daily.     Vitamin D, Ergocalciferol,  (DRISDOL) 1.25 MG (50000 UNIT) CAPS capsule Take 50,000 Units by mouth every Monday.     Vitamin E 180 MG (400 UNIT) CAPS Take 400 Int'l Units by mouth daily.     No current facility-administered medications on file prior to visit.    There are no Patient Instructions on file for this visit. No follow-ups on file.   Georgiana Spinner, NP

## 2023-06-29 ENCOUNTER — Other Ambulatory Visit: Payer: Self-pay

## 2023-06-29 ENCOUNTER — Emergency Department
Admission: EM | Admit: 2023-06-29 | Discharge: 2023-06-29 | Disposition: A | Discharge status: Skilled Nursing Facility | Attending: Emergency Medicine | Admitting: Emergency Medicine

## 2023-06-29 ENCOUNTER — Emergency Department

## 2023-06-29 DIAGNOSIS — F0390 Unspecified dementia without behavioral disturbance: Secondary | ICD-10-CM | POA: Diagnosis not present

## 2023-06-29 DIAGNOSIS — I10 Essential (primary) hypertension: Secondary | ICD-10-CM | POA: Diagnosis not present

## 2023-06-29 DIAGNOSIS — Z85118 Personal history of other malignant neoplasm of bronchus and lung: Secondary | ICD-10-CM | POA: Insufficient documentation

## 2023-06-29 DIAGNOSIS — S72409A Unspecified fracture of lower end of unspecified femur, initial encounter for closed fracture: Secondary | ICD-10-CM | POA: Diagnosis not present

## 2023-06-29 DIAGNOSIS — M79604 Pain in right leg: Secondary | ICD-10-CM | POA: Diagnosis present

## 2023-06-29 DIAGNOSIS — X58XXXA Exposure to other specified factors, initial encounter: Secondary | ICD-10-CM | POA: Diagnosis not present

## 2023-06-29 DIAGNOSIS — Z7901 Long term (current) use of anticoagulants: Secondary | ICD-10-CM | POA: Insufficient documentation

## 2023-06-29 LAB — BASIC METABOLIC PANEL WITH GFR
Anion gap: 7 (ref 5–15)
BUN: 24 mg/dL — ABNORMAL HIGH (ref 8–23)
CO2: 26 mmol/L (ref 22–32)
Calcium: 9.4 mg/dL (ref 8.9–10.3)
Chloride: 103 mmol/L (ref 98–111)
Creatinine, Ser: 0.84 mg/dL (ref 0.44–1.00)
GFR, Estimated: >60 mL/min (ref >60)
Glucose, Bld: 96 mg/dL (ref 70–99)
Potassium: 3.9 mmol/L (ref 3.5–5.1)
Sodium: 136 mmol/L (ref 135–145)

## 2023-06-29 LAB — TYPE AND SCREEN
ABO/RH(D): A POS
Antibody Screen: NEGATIVE

## 2023-06-29 LAB — PROTIME-INR
INR: 1.2 (ref 0.8–1.2)
Prothrombin Time: 15.4 s — ABNORMAL HIGH (ref 11.4–15.2)

## 2023-06-29 LAB — CBC WITH DIFFERENTIAL/PLATELET
Abs Immature Granulocytes: 0.3 10*3/uL — ABNORMAL HIGH (ref 0.00–0.07)
Basophils Absolute: 0.1 10*3/uL (ref 0.0–0.1)
Basophils Relative: 0 %
Eosinophils Absolute: 0.2 10*3/uL (ref 0.0–0.5)
Eosinophils Relative: 1 %
HCT: 36.6 % (ref 36.0–46.0)
Hemoglobin: 13.7 g/dL (ref 12.0–15.0)
Immature Granulocytes: 2 %
Lymphocytes Relative: 13 %
Lymphs Abs: 2 10*3/uL (ref 0.7–4.0)
MCH: 36.5 pg — ABNORMAL HIGH (ref 26.0–34.0)
MCHC: 37.4 g/dL — ABNORMAL HIGH (ref 30.0–36.0)
MCV: 97.6 fL (ref 80.0–100.0)
Monocytes Absolute: 1.3 10*3/uL — ABNORMAL HIGH (ref 0.1–1.0)
Monocytes Relative: 8 %
Neutro Abs: 11.7 10*3/uL — ABNORMAL HIGH (ref 1.7–7.7)
Neutrophils Relative %: 76 %
Platelets: 243 10*3/uL (ref 150–400)
RBC: 3.75 MIL/uL — ABNORMAL LOW (ref 3.87–5.11)
RDW: 14.3 % (ref 11.5–15.5)
WBC: 15.4 10*3/uL — ABNORMAL HIGH (ref 4.0–10.5)
nRBC: 0 % (ref 0.0–0.2)

## 2023-06-29 NOTE — ED Notes (Signed)
 Labs sent with Pt labels as well as type and screen

## 2023-06-29 NOTE — ED Notes (Signed)
 Pt to XR

## 2023-06-29 NOTE — Discharge Instructions (Signed)
 Ms. Wurster has a fracture at the lower end of the femur near the knee.  Based on discussion with the family she is not a candidate for surgery.  She has been placed in a knee immobilizer.  She should stay in the immobilizer at all times except during bathing, with her leg kept in full extension.  This immobilization will last for 3 months.  She may not bear any weight on the right leg.  She will need to follow-up with orthopedics.  She should return to the ER for new, worsening, or persistent severe pain or any other new or worsening symptoms that are concerning.

## 2023-06-29 NOTE — ED Provider Notes (Signed)
 [facility] Provider Note    Event Date/Time   First MD Initiated Contact with Patient 06/29/23 [phone]     (approximate)   History   Fall   HPI  Jane Perez is a 89 y.o. female with a history of chronic ambulation dysfunction, frequent falls, lung cancer status post lobectomy, DVT and PE on Eliquis , hypertension, and recurrent UTI who presents with right leg pain.  The patient is unable to give much history due to dementia.  Per EMS, the patient had right hip pain that was noted yesterday and an x-ray at the facility that was potentially abnormal.  The patient herself states that she had some back pain before, and also endorses some leg pain previously, but states that she has no pain now.  She denies other complaints.  Reviewed the past medical records.  The patient was most recent admitted to the hospitalist service in November 2023 after a fall with several spinal compression fractures and pneumonia.   Physical Exam   Triage Vital Signs: ED Triage Vitals  Encounter Vitals Group     BP 06/29/23 0634 126/74     Systolic BP Percentile --      Diastolic BP Percentile --      Pulse Rate 06/29/23 0634 93     Resp 06/29/23 0634 16     Temp 06/29/23 0634 98.2 F (36.8 C)     Temp Source 06/29/23 0634 Oral     SpO2 06/29/23 0632 95 %     Weight 06/29/23 0636 139 lb 12.8 oz (63.4 kg)     Height --      Head Circumference --      Peak Flow --      Pain Score 06/29/23 0640 0     Pain Loc --      Pain Education --      Exclude from Growth Chart --     Most recent vital signs: Vitals:   06/29/23 0634 06/29/23 0730  BP: 126/74 127/82  Pulse: 93 99  Resp: 16 (!) 26  Temp: 98.2 F (36.8 C)   SpO2: 98% 100%     General: Awake, no distress.  CV:  Good peripheral perfusion.  Resp:  Normal effort.  Abd:  No distention.  Other:  Pain on range of motion of right hip and knee.  No deformity.   ED Results / Procedures / Treatments   Labs (all labs  ordered are listed, but only abnormal results are displayed) Labs Reviewed  BASIC METABOLIC PANEL WITH GFR - Abnormal; Notable for the following components:      Result Value   BUN 24 (*)    All other components within normal limits  CBC WITH DIFFERENTIAL/PLATELET - Abnormal; Notable for the following components:   WBC 15.4 (*)    RBC 3.75 (*)    MCH 36.5 (*)    MCHC 37.4 (*)    Neutro Abs 11.7 (*)    Monocytes Absolute 1.3 (*)    Abs Immature Granulocytes 0.30 (*)    All other components within normal limits  PROTIME-INR - Abnormal; Notable for the following components:   Prothrombin Time 15.4 (*)    All other components within normal limits  TYPE AND SCREEN     EKG  ED ECG REPORT I, Lind Repine, the attending physician, personally viewed and interpreted this ECG.  Date: 06/29/2023 EKG Time: 0642 Rate: 92 Rhythm: normal sinus rhythm QRS Axis: normal Intervals: normal ST/T Wave  abnormalities: normal Narrative Interpretation: no evidence of acute ischemia    RADIOLOGY  XR R hip: I independently viewed and interpreted the images; there is no acute fracture  XR R femur:  Acute closed impacted fracture of the distal femoral  diametaphysis just above the condyles, with the distal fragment  having mild anterior and lateral angulation at the impacted fracture  site.    Chest XR: No acute abnormality   PROCEDURES:  Critical Care performed: No  Procedures   MEDICATIONS ORDERED IN ED: Medications - No data to display   IMPRESSION / MDM / ASSESSMENT AND PLAN / ED COURSE  I reviewed the triage vital signs and the nursing notes.  89 year old female with PMH as noted above presents with apparent right hip and/or distal femur area pain with an apparent abnormal x-ray showing a fracture at her facility.  On exam the vital signs are normal.  The patient is overall comfortable appearing.  She denies pain at rest although when I range the right leg she reports  pain close to the hip as well as down closer to the knee.  There is no visible trauma otherwise.  Neurologic exam is nonfocal.  Differential diagnosis includes, but is not limited to, contusion, fracture, muscle strain or spasm.  We will obtain x-rays of the right hip and femur, basic labs, and reassess.  Patient's presentation is most consistent with acute presentation with potential threat to life or bodily function.  The patient is on the cardiac monitor to evaluate for evidence of arrhythmia and/or significant heart rate changes.  ----------------------------------------- 9:05 AM on 06/29/2023 -----------------------------------------  X-rays demonstrated distal femur fracture.  I consulted Dr. Daun Epstein from orthopedics who reviewed the images.  Based on our discussion he advised that the definitive treatment for this would be surgery and based on the fracture morphology the patient would need transfer to [facility] or another site with trauma orthopedics.  However, I discussed this with the patient's son Jane Perez.  The patient is already bed/wheelchair bound at baseline.  She normally does not walk at all or bear any weight on her legs.  She has been immobile for at least a year.  Therefore, he feels that surgery would not improve her quality of life, and I agree with this assessment.  Dr. Daun Epstein advised that the alternative nonoperative treatment would be immobilization in the immobilizer held in full extension for 3 months with no weightbearing.  Lab workup is only significant for mild leukocytosis likely reactive.  BMP is unremarkable.  At this time, the patient has no active pain when at rest.  She is stable for discharge back to her facility.  I advised the son on orthopedic recommendations for immobilization and have provided written discharge instructions to this effect for her facility.  I gave strict return precautions as well.   FINAL CLINICAL IMPRESSION(S) / ED DIAGNOSES   Final  diagnoses:  Closed fracture of distal end of femur, unspecified fracture morphology, initial encounter (HCC)     Rx / DC Orders   ED Discharge Orders     None        Note:  This document was prepared using Dragon voice recognition software and may include unintentional dictation errors.    Lind Repine, MD 06/29/23 [phone]

## 2023-06-29 NOTE — ED Notes (Signed)
 Update given to Pt son Gwinda Leopard made aware going back to SNF.

## 2023-06-29 NOTE — ED Notes (Signed)
 Fall precautions in place for Pt. This RN placed fall band, fall grip socks, bed alarm and fall sign.

## 2023-06-29 NOTE — ED Triage Notes (Addendum)
 S: - Pt arrives via EMS for from [place] [facility]. Right hip pain, was noticed yesterday  B: - Medical Hx: Dementia  - Pertinent Medications: Lasix , Eliquis   A: - EMS Assessment: Pain on hip  - EMS Vitals: BP 151/87, O2 95% RA, HR 98, CBG- none  - EMS interventions: No  - This RNs Assessment: Airway - Intact  Breathing - RA  Circulation -> 3 seconds Disability  - Confused, baseline dementia  R: - What brought Pt into ER today? Has XR yesterday at facility, reports came back with distal femur fracture. Unaware if old or new fracture. Facility wanted Pt seen. Pt c/o of hip pain. No known falls per facility.

## 2023-11-10 ENCOUNTER — Observation Stay
Admission: EM | Admit: 2023-11-10 | Discharge: 2023-11-11 | Disposition: A | Discharge status: Home / Self Care | Source: Ambulatory Visit | Attending: Internal Medicine | Admitting: Internal Medicine

## 2023-11-10 ENCOUNTER — Other Ambulatory Visit: Payer: Self-pay

## 2023-11-10 ENCOUNTER — Emergency Department

## 2023-11-10 ENCOUNTER — Encounter: Payer: Self-pay | Admitting: Hospitalist

## 2023-11-10 DIAGNOSIS — J45909 Unspecified asthma, uncomplicated: Secondary | ICD-10-CM | POA: Diagnosis not present

## 2023-11-10 DIAGNOSIS — R112 Nausea with vomiting, unspecified: Secondary | ICD-10-CM

## 2023-11-10 DIAGNOSIS — R531 Weakness: Secondary | ICD-10-CM

## 2023-11-10 DIAGNOSIS — N3000 Acute cystitis without hematuria: Secondary | ICD-10-CM

## 2023-11-10 DIAGNOSIS — N179 Acute kidney failure, unspecified: Secondary | ICD-10-CM | POA: Insufficient documentation

## 2023-11-10 DIAGNOSIS — I10 Essential (primary) hypertension: Secondary | ICD-10-CM | POA: Insufficient documentation

## 2023-11-10 DIAGNOSIS — A419 Sepsis, unspecified organism: Secondary | ICD-10-CM | POA: Diagnosis not present

## 2023-11-10 DIAGNOSIS — R296 Repeated falls: Secondary | ICD-10-CM | POA: Insufficient documentation

## 2023-11-10 DIAGNOSIS — Z86718 Personal history of other venous thrombosis and embolism: Secondary | ICD-10-CM | POA: Diagnosis not present

## 2023-11-10 DIAGNOSIS — Z515 Encounter for palliative care: Secondary | ICD-10-CM

## 2023-11-10 DIAGNOSIS — J189 Pneumonia, unspecified organism: Principal | ICD-10-CM | POA: Insufficient documentation

## 2023-11-10 DIAGNOSIS — N390 Urinary tract infection, site not specified: Principal | ICD-10-CM | POA: Diagnosis present

## 2023-11-10 DIAGNOSIS — R5381 Other malaise: Secondary | ICD-10-CM | POA: Diagnosis not present

## 2023-11-10 DIAGNOSIS — R4182 Altered mental status, unspecified: Secondary | ICD-10-CM | POA: Diagnosis present

## 2023-11-10 LAB — CBC WITH DIFFERENTIAL/PLATELET
Abs Immature Granulocytes: 0.78 K/uL — ABNORMAL HIGH (ref 0.00–0.07)
Basophils Absolute: 0.1 K/uL (ref 0.0–0.1)
Basophils Relative: 0 %
Eosinophils Absolute: 0 K/uL (ref 0.0–0.5)
Eosinophils Relative: 0 %
HCT: 41.4 % (ref 36.0–46.0)
Hemoglobin: 13.7 g/dL (ref 12.0–15.0)
Immature Granulocytes: 3 %
Lymphocytes Relative: 5 %
Lymphs Abs: 1.1 K/uL (ref 0.7–4.0)
MCH: 32.5 pg (ref 26.0–34.0)
MCHC: 33.1 g/dL (ref 30.0–36.0)
MCV: 98.1 fL (ref 80.0–100.0)
Monocytes Absolute: 1.1 K/uL — ABNORMAL HIGH (ref 0.1–1.0)
Monocytes Relative: 5 %
Neutro Abs: 21.4 K/uL — ABNORMAL HIGH (ref 1.7–7.7)
Neutrophils Relative %: 87 %
Platelets: 248 K/uL (ref 150–400)
RBC: 4.22 MIL/uL (ref 3.87–5.11)
RDW: 13.8 % (ref 11.5–15.5)
WBC: 24.5 K/uL — ABNORMAL HIGH (ref 4.0–10.5)
nRBC: 0 % (ref 0.0–0.2)

## 2023-11-10 LAB — URINALYSIS, W/ REFLEX TO CULTURE (INFECTION SUSPECTED)
Bacteria, UA: NONE SEEN
RBC / HPF: >50 RBC/hpf (ref 0–5)
Squamous Epithelial / HPF: 0 /HPF (ref 0–5)
WBC, UA: >50 WBC/hpf (ref 0–5)

## 2023-11-10 LAB — RESP PANEL BY RT-PCR (RSV, FLU A&B, COVID)  RVPGX2
Influenza A by PCR: NEGATIVE
Influenza B by PCR: NEGATIVE
Resp Syncytial Virus by PCR: NEGATIVE
SARS Coronavirus 2 by RT PCR: NEGATIVE

## 2023-11-10 LAB — LACTIC ACID, PLASMA
Lactic Acid, Venous: 5.5 mmol/L (ref 0.5–1.9)
Lactic Acid, Venous: 4.3 mmol/L (ref 0.5–1.9)

## 2023-11-10 LAB — COMPREHENSIVE METABOLIC PANEL WITH GFR
ALT: 13 U/L (ref 0–44)
AST: 38 U/L (ref 15–41)
Albumin: 2.5 g/dL — ABNORMAL LOW (ref 3.5–5.0)
Alkaline Phosphatase: 69 U/L (ref 38–126)
Anion gap: 16 — ABNORMAL HIGH (ref 5–15)
BUN: 62 mg/dL — ABNORMAL HIGH (ref 8–23)
CO2: 16 mmol/L — ABNORMAL LOW (ref 22–32)
Calcium: 9.5 mg/dL (ref 8.9–10.3)
Chloride: 100 mmol/L (ref 98–111)
Creatinine, Ser: 1.72 mg/dL — ABNORMAL HIGH (ref 0.44–1.00)
GFR, Estimated: 28 mL/min — ABNORMAL LOW (ref >60)
Glucose, Bld: 192 mg/dL — ABNORMAL HIGH (ref 70–99)
Potassium: 4.3 mmol/L (ref 3.5–5.1)
Sodium: 132 mmol/L — ABNORMAL LOW (ref 135–145)
Total Bilirubin: 0.8 mg/dL (ref 0.0–1.2)
Total Protein: 6.1 g/dL — ABNORMAL LOW (ref 6.5–8.1)

## 2023-11-10 LAB — PROTIME-INR
INR: 2 — ABNORMAL HIGH (ref 0.8–1.2)
Prothrombin Time: 23.3 s — ABNORMAL HIGH (ref 11.4–15.2)

## 2023-11-10 MED ORDER — FUROSEMIDE 10 MG/ML IJ SOLN: 20.0000 mg | Freq: Two times a day (BID) | INTRAMUSCULAR | Status: DC

## 2023-11-10 MED ORDER — SODIUM CHLORIDE 0.9 % IV SOLN: 2.0000 g | Freq: Three times a day (TID) | INTRAVENOUS | Status: DC

## 2023-11-10 MED ORDER — ONDANSETRON HCL 4 MG PO TABS: 4.0000 mg | ORAL_TABLET | Freq: Four times a day (QID) | ORAL | Status: DC | PRN

## 2023-11-10 MED ORDER — VANCOMYCIN HCL IN DEXTROSE 1-5 GM/200ML-% IV SOLN
1000.0000 mg | Freq: Once | INTRAVENOUS | Status: AC
  Administered 2023-11-10: 1000 mg via INTRAVENOUS
  Filled 2023-11-10: qty 200

## 2023-11-10 MED ORDER — MEGESTROL ACETATE 20 MG PO TABS
40.0000 mg | ORAL_TABLET | Freq: Every day | ORAL | Status: DC
  Administered 2023-11-10 – 2023-11-11 (×2): 40 mg via ORAL
  Filled 2023-11-10 (×3): qty 2

## 2023-11-10 MED ORDER — VANCOMYCIN VARIABLE DOSE PER UNSTABLE RENAL FUNCTION (PHARMACIST DOSING)
Status: DC
  Filled 2023-11-10: qty 1

## 2023-11-10 MED ORDER — ONDANSETRON HCL 4 MG/2ML IJ SOLN: 4.0000 mg | Freq: Four times a day (QID) | INTRAMUSCULAR | Status: DC | PRN

## 2023-11-10 MED ORDER — POLYETHYLENE GLYCOL 3350 17 G PO PACK: 17.0000 g | PACK | Freq: Every day | ORAL | Status: DC | PRN

## 2023-11-10 MED ORDER — SODIUM CHLORIDE 0.9 % IV BOLUS
1000.0000 mL | Freq: Once | INTRAVENOUS | Status: AC
  Administered 2023-11-10: 1000 mL via INTRAVENOUS

## 2023-11-10 MED ORDER — ACETAMINOPHEN 650 MG RE SUPP: 650.0000 mg | Freq: Four times a day (QID) | RECTAL | Status: DC | PRN

## 2023-11-10 MED ORDER — GABAPENTIN 100 MG PO CAPS
100.0000 mg | ORAL_CAPSULE | Freq: Every day | ORAL | Status: DC
  Administered 2023-11-10: 100 mg via ORAL
  Filled 2023-11-10: qty 1

## 2023-11-10 MED ORDER — ACETAMINOPHEN 325 MG PO TABS: 650.0000 mg | ORAL_TABLET | Freq: Four times a day (QID) | ORAL | Status: DC | PRN

## 2023-11-10 MED ORDER — ENOXAPARIN SODIUM 40 MG/0.4ML IJ SOSY: 40.0000 mg | PREFILLED_SYRINGE | INTRAMUSCULAR | Status: DC

## 2023-11-10 MED ORDER — LACTATED RINGERS IV BOLUS (SEPSIS)
1000.0000 mL | Freq: Once | INTRAVENOUS | Status: AC
  Administered 2023-11-10: 1000 mL via INTRAVENOUS

## 2023-11-10 MED ORDER — SODIUM CHLORIDE 0.9 % IV SOLN
2.0000 g | Freq: Once | INTRAVENOUS | Status: AC
  Administered 2023-11-10: 2 g via INTRAVENOUS
  Filled 2023-11-10: qty 10

## 2023-11-10 MED ORDER — APIXABAN 5 MG PO TABS
5.0000 mg | ORAL_TABLET | Freq: Two times a day (BID) | ORAL | Status: DC
  Administered 2023-11-10 – 2023-11-11 (×2): 5 mg via ORAL
  Filled 2023-11-10 (×2): qty 1

## 2023-11-10 MED ORDER — TRAZODONE HCL 50 MG PO TABS: 50.0000 mg | ORAL_TABLET | Freq: Every day | ORAL | Status: DC

## 2023-11-10 MED ORDER — ALBUTEROL SULFATE (2.5 MG/3ML) 0.083% IN NEBU: 2.5000 mg | INHALATION_SOLUTION | Freq: Four times a day (QID) | RESPIRATORY_TRACT | Status: DC | PRN

## 2023-11-10 MED ORDER — SODIUM CHLORIDE 0.9 % IV SOLN
2.0000 g | INTRAVENOUS | Status: DC
  Administered 2023-11-10: 2 g via INTRAVENOUS
  Filled 2023-11-10 (×2): qty 20

## 2023-11-10 MED ORDER — VITAMIN B-12 1000 MCG PO TABS
1000.0000 ug | ORAL_TABLET | Freq: Every day | ORAL | Status: DC
  Administered 2023-11-11: 1000 ug via ORAL
  Filled 2023-11-10: qty 1

## 2023-11-10 NOTE — ED Notes (Signed)
 CCMD called to admit patient to monitor

## 2023-11-10 NOTE — ED Notes (Signed)
 Lab called to collect second lactic acid.

## 2023-11-10 NOTE — ED Notes (Signed)
 Lab only able to obtain 1 blue bottle for 1 blood culture

## 2023-11-10 NOTE — H&P (Signed)
 History and Physical    Jane Perez FMW:[id number] DOB: [birth year] DOA: 11/10/2023  DOS: the patient was seen and examined on 11/10/2023  PCP: Sadie Manna, MD   Patient coming from: SNF  I have personally briefly reviewed patient's old medical records in [facility]  Chief Complaint: Acute change in mental status  HPI: Jane Perez is a pleasant 89 y.o. female with medical history significant for HTN, frequent falls, lung cancer s/p lobectomy, DVT and PE on Eliquis , recurrent UTI?  Chronic nitrofurantoin who presented to ED at [facility] from nursing home for acute change in mental status.  According to EMS report patient seemed more tired and 1 episode of vomiting.  The initial blood pressure was 70 systolic and required 500 cc of normal saline and the route to the hospital with systolic blood pressure on arrival was 92.  Blood pressure recheck was 76/62, patient was tachycardic.  Patient was recently started antibiotic for possible he urinary tract infection last Saturday.  In the ED patient was awake alert and answered simple questions.  She denied any fever chills nausea vomiting abdominal pain chest pain shortness of breath palpitations.  She was however not normally responding she was slow to respond.  ED Course: Upon arrival to the ED, patient is found to be tachycardic at 117, hypotensive requiring IV fluid, saturating 100%, urine analysis was positive for urinary tract infection.  Patient was given IV fluid 500 cc prior to coming to ED and 1 L in the emergency room.  She was given vancomycin  and aztreonam .  Hospitalist service was consulted for evaluation for admission for sepsis due to urinary tract infection.  Review of Systems:  ROS  All other systems negative except as noted in the HPI.  Past Medical History:  Diagnosis Date   Asthma    Bladder cancer (HCC)    Colitis    DVT (deep venous thrombosis) (HCC)    Endometriosis    Heart murmur    HTN (hypertension)    Kidney  disease    Liver disease    Lung cancer (HCC)    Pulmonary thrombosis (HCC)     Past Surgical History:  Procedure Laterality Date   ABDOMINAL HYSTERECTOMY     Bladder cancer surgery     Endometrial surgery     LUNG REMOVAL, PARTIAL Right    SMALL INTESTINE SURGERY     x 2     reports that she has never smoked. She does not have any smokeless tobacco history on file. She reports that she does not drink alcohol and does not use drugs.  Allergies  Allergen Reactions   Aspirin Other (See Comments)    Will cause hemerage    Ciprofloxacin Hives   Codeine Other (See Comments)    Other Reaction: GI Upset   Metronidazole Hives   Penicillins Shortness Of Breath   Sulfa  Antibiotics Shortness Of Breath   Clindamycin Hcl Other (See Comments)   Nitrofurantoin Monohyd Macro Other (See Comments) and Nausea Only   Other Other (See Comments)    Uncoded Allergy. Allergen: flu shot, Other Reaction: Other reaction   Povidone Iodine Other (See Comments)    Other Reaction: Other reaction    Family History  Problem Relation Age of Onset   Kidney disease Brother    Bladder Cancer Neg Hx    Kidney disease Mother    Lung cancer Father    Colon cancer Mother    Hypertension Other  multiple family members    Prior to Admission medications   Medication Sig Start Date End Date Taking? Authorizing Provider  acetaminophen  (TYLENOL ) 500 MG tablet Take 500 mg by mouth in the morning, at noon, and at bedtime.   Yes [provider]  apixaban  (ELIQUIS ) 5 MG TABS tablet Take 5 mg by mouth 2 (two) times daily.   Yes [provider]  Cranberry 450 MG TABS Take 500 mg by mouth daily.   Yes [provider]  furosemide  (LASIX ) 20 MG tablet Take 1 tablet (20 mg total) by mouth daily as needed for fluid or edema. 01/29/22  Yes Alexander, Natalie, DO  gabapentin  (NEURONTIN ) 100 MG capsule Take 100 mg by mouth at bedtime.   Yes [provider]  Lactobacillus  Acid-Pectin (ACIDOPHILUS/PECTIN) CAPS Take 1 capsule by mouth daily.   Yes [provider]  megestrol  (MEGACE ) 40 MG tablet Take 40 mg by mouth daily. 06/21/22  Yes [provider]  Multiple Vitamin (MULTI-VITAMINS) TABS Take 1 tablet by mouth daily.   Yes [provider]  nitrofurantoin, macrocrystal-monohydrate, (MACROBID) 100 MG capsule Take 100 mg by mouth 2 (two) times daily. 11/08/23  Yes [provider]  senna-docusate (SENOKOT-S) 8.6-50 MG tablet Take 2 tablets by mouth at bedtime as needed for mild constipation or moderate constipation. 01/29/22  Yes Alexander, Natalie, DO  vitamin B-12 (CYANOCOBALAMIN ) 1000 MCG tablet Take 1,000 mcg by mouth daily.   Yes [provider]  Vitamin D, Ergocalciferol, (DRISDOL) 1.25 MG (50000 UNIT) CAPS capsule Take 50,000 Units by mouth every Monday.   Yes [provider]  albuterol  (VENTOLIN  HFA) 108 (90 Base) MCG/ACT inhaler Inhale 2 puffs into the lungs every 6 (six) hours as needed for wheezing or shortness of breath. Patient not taking: Reported on 11/10/2023 01/29/22   Alexander, Natalie, DO  bisacodyl  (DULCOLAX) 10 MG suppository Place 1 suppository (10 mg total) rectally daily as needed for severe constipation. Patient not taking: Reported on 11/10/2023 01/29/22   Alexander, Natalie, DO  polyethylene glycol (MIRALAX  / GLYCOLAX ) 17 g packet Take 17 g by mouth daily. Patient not taking: Reported on 11/10/2023 01/29/22   Alexander, Natalie, DO  traZODone  (DESYREL ) 50 MG tablet Take 50 mg by mouth at bedtime. Patient not taking: Reported on 11/10/2023    [provider]  Vitamin E 180 MG (400 UNIT) CAPS Take 400 Int'l Units by mouth daily. Patient not taking: Reported on 11/10/2023    [provider]    Physical Exam: Vitals:   11/10/23 1543 11/10/23 1600 11/10/23 1630 11/10/23 1700  BP:  92/62 96/63 96/60   Pulse:  83 82 83  Resp:  12 14 20   Temp: (!) 97.5 F (36.4 C)     TempSrc: Oral      SpO2:  100% 100% 100%  Weight:      Height:        Physical Exam   Constitutional: Alert, awake, calm, comfortable HEENT: Neck supple Respiratory: Clear to auscultation B/L, no wheezing, no rales.  Cardiovascular: Regular rate and rhythm, no murmurs / rubs / gallops. No extremity edema. 2+ pedal pulses. No carotid bruits.  2+ bilateral pitting edema with some skin changes Abdomen: Soft, no tenderness, Bowel sounds positive.  Musculoskeletal: no clubbing / cyanosis. Good ROM, no contractures. Normal muscle tone.  Skin: no rashes, lesions, ulcers. Neurologic: CN 2-12 grossly intact. Sensation intact, No focal deficit identified Psychiatric: Alert and awake. Normal mood.    Labs on Admission: I have personally  reviewed following labs and imaging studies  CBC: Recent Labs  Lab 11/10/23 1450  WBC 24.5*  NEUTROABS 21.4*  HGB 13.7  HCT 41.4  MCV 98.1  PLT 248   Basic Metabolic Panel: Recent Labs  Lab 11/10/23 1450  NA 132*  K 4.3  CL 100  CO2 16*  GLUCOSE 192*  BUN 62*  CREATININE 1.72*  CALCIUM 9.5   GFR: Estimated Creatinine Clearance: 17.6 mL/min (A) (by C-G formula based on SCr of 1.72 mg/dL (H)). Liver Function Tests: Recent Labs  Lab 11/10/23 1450  AST 38  ALT 13  ALKPHOS 69  BILITOT 0.8  PROT 6.1*  ALBUMIN 2.5*   No results for input(s): LIPASE, AMYLASE in the last 168 hours. No results for input(s): AMMONIA in the last 168 hours. Coagulation Profile: Recent Labs  Lab 11/10/23 1450  INR 2.0*   Cardiac Enzymes: No results for input(s): CKTOTAL, CKMB, CKMBINDEX, TROPONINI, TROPONINIHS in the last 168 hours. BNP (last 3 results) No results for input(s): BNP in the last 8760 hours. HbA1C: No results for input(s): HGBA1C in the last 72 hours. CBG: No results for input(s): GLUCAP in the last 168 hours. Lipid Profile: No results for input(s): CHOL, HDL, LDLCALC, TRIG, CHOLHDL, LDLDIRECT in the last 72  hours. Thyroid  Function Tests: No results for input(s): TSH, T4TOTAL, FREET4, T3FREE, THYROIDAB in the last 72 hours. Anemia Panel: No results for input(s): VITAMINB12, FOLATE, FERRITIN, TIBC, IRON, RETICCTPCT in the last 72 hours. Urine analysis:    Component Value Date/Time   COLORURINE RED (A) 11/10/2023 1541   APPEARANCEUR TURBID (A) 11/10/2023 1541   APPEARANCEUR Clear 04/11/2015 1339   LABSPEC  11/10/2023 1541    TEST NOT REPORTED DUE TO COLOR INTERFERENCE OF URINE PIGMENT   PHURINE  11/10/2023 1541    TEST NOT REPORTED DUE TO COLOR INTERFERENCE OF URINE PIGMENT   GLUCOSEU (A) 11/10/2023 1541    TEST NOT REPORTED DUE TO COLOR INTERFERENCE OF URINE PIGMENT   HGBUR (A) 11/10/2023 1541    TEST NOT REPORTED DUE TO COLOR INTERFERENCE OF URINE PIGMENT   BILIRUBINUR (A) 11/10/2023 1541    TEST NOT REPORTED DUE TO COLOR INTERFERENCE OF URINE PIGMENT   BILIRUBINUR Negative 04/11/2015 1339   KETONESUR (A) 11/10/2023 1541    TEST NOT REPORTED DUE TO COLOR INTERFERENCE OF URINE PIGMENT   PROTEINUR (A) 11/10/2023 1541    TEST NOT REPORTED DUE TO COLOR INTERFERENCE OF URINE PIGMENT   NITRITE (A) 11/10/2023 1541    TEST NOT REPORTED DUE TO COLOR INTERFERENCE OF URINE PIGMENT   LEUKOCYTESUR (A) 11/10/2023 1541    TEST NOT REPORTED DUE TO COLOR INTERFERENCE OF URINE PIGMENT    Radiological Exams on Admission: I have personally reviewed images DG Chest Port 1 View Result Date: 11/10/2023 CLINICAL DATA:  Sepsis EXAM: PORTABLE CHEST 1 VIEW COMPARISON:  June 29, 2023 FINDINGS: Limited examination due to overlying support devices and patient positioning. Patchy opacity at the left lung base, possibly representing atelectasis and/or infection. No pleural effusions. No pneumothorax. Cardiomediastinal silhouette is unchanged. Aortic calcifications. IMPRESSION: Limited examination. Patchy opacity at the left lung base, possibly representing atelectasis and/or infection.  Electronically Signed   By: Michaeline Blanch M.D.   On: 11/10/2023 12:26    EKG: My personal interpretation of EKG shows: Sinus arrhythmia and tachycardia    Assessment/Plan Principal Problem:   UTI (urinary tract infection) Active Problems:   Frequent falls   Acute sepsis (HCC)    Assessment and Plan:  89 year old female W/PMH of frequent falls, lung cancer s/p lobectomy, bladder cancer, history of DVT and PE on anticoagulation, HTN, recurrent UTI, came in from nursing home to ED for acute change in mental status.  1.  UTI sepsis present on admission - Patient has a positive urine analysis -She meets criteria for sepsis due to hypotension, tachycardia, lactic acidosis and leukocytosis. - She was given IV fluid, antibiotics and cultures were drawn in the emergency room. - She was given vancomycin  and aztreonam  in the emergency room as there is a penicillin allergy. - Discussed with pharmacist who advised that patient received ceftriaxone  in the past and it is okay to continue.  She will be on ceftriaxone  and vancomycin  for sepsis due to UTI. - Will continue to follow the cultures.  2.  AKI - May be part of sepsis - She used to get diuretics at nursing home. - She received 1.5 L of normal saline in the emergency room and EMS. - I will not give her IV fluid nor diuretics. - Will continue to monitor kidney function. - Will make the decision whether to continue diuretics or IV fluid in the morning after the labs.  3.  Acute change in mental status - Likely due to sepsis due to UTI delirium - Supportive care  4.  History of DVT/PE - Continue on Eliquis   5.  Deconditioning - PT/OT  6.  Hypertension now hypotension - Patient is hypotensive now - Hold off for home medications for blood pressure - Continue to monitor blood pressure - May need to give additional bolus if needed       DVT prophylaxis: Eliquis  Code Status: Full Code Family Communication: Spoke with patient's  son over the telephone.  Nancyann advised that patient is full code.  He was explained about her medical condition. Disposition Plan: Nursing home Consults called: None Admission status: Inpatient, Telemetry bed   Nena Rebel, MD [facility] 11/10/2023, 5:46 PM

## 2023-11-10 NOTE — ED Triage Notes (Signed)
 Pt arrives via [facility] from [facility] for [facility]. Pt started abx Saturday for UTI. Per EMS, initial BP was 70 systolic. Pt given 500mL LR and BP improved 92/56. Staff reported to EMS she was vomiting this morning and lethargic.

## 2023-11-10 NOTE — ED Notes (Signed)
 Requested lab stick after multiple nurses attempted. Lab aware pt needs sepsis labs.

## 2023-11-10 NOTE — ED Notes (Addendum)
 Bedside Hemoccult positive

## 2023-11-10 NOTE — ED Notes (Signed)
 Lab at bedside

## 2023-11-10 NOTE — Sepsis Progress Note (Signed)
 Sepsis protocol monitored by eLink

## 2023-11-10 NOTE — ED Provider Notes (Signed)
 [facility] Provider Note    Event Date/Time   First MD Initiated Contact with Patient 11/10/23 1149     (approximate)  History   Chief Complaint: Altered Mental Status  HPI  Jane Perez is a 89 y.o. female with a past medical history of hypertension, presents from her nursing facility for altered mental status nausea and vomiting.  According to EMS report patient is from a nursing facility where they noted this morning that the patient seemed more tired than normal and had an episode of vomiting.  They state initial blood pressure around 70 systolic give 500 cc of fluid en route to the hospital blood pressure on arrival 92 per EMS systolic.  Blood pressure recheck here 76/62 patient is tachycardic.  EMS reports patient was started on antibiotic on Saturday for urinary tract infection.  Here the patient is awake alert and she is able to answer simple questions.  Denies any pain or complaints.  Physical Exam   Triage Vital Signs: ED Triage Vitals [11/10/23 1152]  Encounter Vitals Group     BP      Girls Systolic BP Percentile      Girls Diastolic BP Percentile      Boys Systolic BP Percentile      Boys Diastolic BP Percentile      Pulse Rate (!) 117     Resp 19     Temp      Temp src      SpO2 100 %     Weight      Height      Head Circumference      Peak Flow      Pain Score      Pain Loc      Pain Education      Exclude from Growth Chart     Most recent vital signs: Vitals:   11/10/23 1152  Pulse: (!) 117  Resp: 19  SpO2: 100%    General: Awake, no distress.  CV:  Good peripheral perfusion.  Regular rate and rhythm  Resp:  Normal effort.  Equal breath sounds bilaterally.  Abd:  No distention.  Soft, nontender.    ED Results / Procedures / Treatments   EKG  I have reviewed and interpreted the EKG.  On my evaluation patient is in sinus tachycardia 108 bpm with a narrow QRS, normal axis, normal intervals, nonspecific ST  changes.  RADIOLOGY  I have reviewed and interpreted the chest x-ray images.  No obvious significant abnormality seen on my evaluation. Radiology has read the x-ray has patchy opacity in the left lung base possibly indicating atelectasis versus infection   MEDICATIONS ORDERED IN ED: Medications - No data to display   IMPRESSION / MDM / ASSESSMENT AND PLAN / ED COURSE  I reviewed the triage vital signs and the nursing notes.  Patient's presentation is most consistent with acute presentation with potential threat to life or bodily function.  Patient presents to the emergency department for fatigue nausea vomiting.  Here the patient is awake alert answering questions appropriately.  Patient is hypotensive and tachycardic.  Afebrile however stool is a slightly dark appearance and is very slightly guaiac positive.  Per report patient being treated for urinary tract infection since Saturday.  Given her hypotension and tachycardia we will check labs, blood cultures will IV hydrate with 1 L of crystalloid.  We will obtain a urine sample as well as a chest x-ray.  Given the patient's UTI  suspect the patient could be suffering from a more advanced infection and now may be meeting sepsis criteria.  Given her hypotension we will reassess after IV hydration patient has received 500 cc of fluid prior to arrival we will dose additional 1 L in the emergency department.  Patient is very difficult IV stick, multiple nurses attempts have failed, IV team has seen the patient attempted to draw labs however many labs hemolyzed.  They are sending someone back to draw labs.  Patient does have an IV that is working but does not pull back blood.  Patient receiving IV fluids blood pressure has improved somewhat currently around 95 systolic.  Chest x-ray does show possible atelectasis versus infection we will cover with antibiotics as precaution.  Awaiting lab work however I anticipate the patient will likely need admission  to the hospital once her workup is been completed given her hypotension and concern for infection.  FINAL CLINICAL IMPRESSION(S) / ED DIAGNOSES   Hypotension Nausea vomiting Pneumonia  Note:  This document was prepared using Dragon voice recognition software and may include unintentional dictation errors.   Dorothyann Drivers, MD 11/10/23 1434

## 2023-11-10 NOTE — Plan of Care (Signed)

## 2023-11-10 NOTE — ED Provider Notes (Signed)
 He received in signout from Dr. Dorothyann.  He was signs of severe urosepsis.  BP and heart rate has normalized after 3 L of fluids, no indications for pressors or ICU admission.  Lactic acid is elevated, urine and blood sent for culture, signs of AKI.  Leukocytosis.  She has been provided antibiotics.  I consult with medicine for admission.  .Critical Care  Performed by: Claudene Rover, MD Authorized by: Claudene Rover, MD   Critical care provider statement:    Critical care time (minutes):  30   Critical care time was exclusive of:  Separately billable procedures and treating other patients   Critical care was necessary to treat or prevent imminent or life-threatening deterioration of the following conditions:  Sepsis and shock   Critical care was time spent personally by me on the following activities:  Development of treatment plan with patient or surrogate, discussions with consultants, evaluation of patient's response to treatment, examination of patient, ordering and review of laboratory studies, ordering and review of radiographic studies, ordering and performing treatments and interventions, pulse oximetry, re-evaluation of patient's condition and review of old charts     Claudene Rover, MD 11/10/23 1729

## 2023-11-10 NOTE — ED Notes (Signed)
 Advised nurse that patient has ready bed

## 2023-11-10 NOTE — ED Notes (Signed)
 Dr Dorothyann notified of delay in sepsis labs due to difficult access.

## 2023-11-10 NOTE — ED Notes (Signed)
 Dr Dorothyann notified that lab/nurses were only able to obtain a partial blood culture.

## 2023-11-10 NOTE — Consult Note (Signed)
 Pharmacy Antibiotic Note  Jane Perez is a 89 y.o. female admitted on 11/10/2023 with sepsis.  Pharmacy has been consulted for vancomycin  dosing.  AKI Scr 1.72 (0.84 06/29/23)  Plan: Patient already received vancomycin  1000 mg IV x 1 Will use variable vancomycin  dosing due to AKI Will order vancomycin  random level for 9/2 @ 1330 Ceftriaxone  2 grams IV every 24 hours per provider Adjust dosing based on renal function and cultures  Height: 5' 3 (160 cm) Weight: 55.3 kg (122 lb) IBW/kg (Calculated) : 52.4  Temp (24hrs), Avg:97.9 F (36.6 C), Min:97.5 F (36.4 C), Max:98.2 F (36.8 C)  Recent Labs  Lab 11/10/23 1450  WBC 24.5*  CREATININE 1.72*  LATICACIDVEN 5.5*    Estimated Creatinine Clearance: 17.6 mL/min (A) (by C-G formula based on SCr of 1.72 mg/dL (H)).    Allergies  Allergen Reactions   Aspirin Other (See Comments)    Will cause hemerage    Ciprofloxacin Hives   Codeine Other (See Comments)    Other Reaction: GI Upset   Metronidazole Hives   Penicillins Shortness Of Breath   Sulfa  Antibiotics Shortness Of Breath   Clindamycin Hcl Other (See Comments)   Nitrofurantoin Monohyd Macro Other (See Comments) and Nausea Only   Other Other (See Comments)    Uncoded Allergy. Allergen: flu shot, Other Reaction: Other reaction   Povidone Iodine Other (See Comments)    Other Reaction: Other reaction    Antimicrobials this admission: Ceftriaxone  9/1 >>  Vancomycin  9/1 >>  Aztreonam  x 1 in ED 9/1  Dose adjustments this admission: N/A  Microbiology results: 9/1 BCx: pending 9/1 UCx: pending    Thank you for allowing pharmacy to be a part of this patient's care.  Kayla JULIANNA Blew, PharmD, BCPS 11/10/2023 5:52 PM

## 2023-11-10 NOTE — ED Notes (Signed)
 Date and time results received: 11/10/23 1549  Test: Lactic Acid Critical Value: 5.5  Name of Provider Notified: MD Claudene

## 2023-11-10 NOTE — ED Notes (Signed)
 Lab called about room change and pending labs still needing to be drawn. Patient is a hard stick and her lines do not draw back.

## 2023-11-10 NOTE — Progress Notes (Signed)
 CODE SEPSIS - PHARMACY COMMUNICATION  **Broad Spectrum Antibiotics should be administered within 1 hour of Sepsis diagnosis**  Time Code Sepsis Called/Page Received: 1254  Antibiotics Ordered:  Aztreonam  2 g IV x 1 Vancomycin  1000 mg IV x 1   Time of 1st antibiotic administration: 1302  Additional action taken by pharmacy: None  If necessary, Name of Provider/Nurse Contacted: N/A   Ransom Blanch PGY-1 Pharmacy Resident  [place] [facility]  11/10/2023 1:06 PM

## 2023-11-10 NOTE — ED Notes (Signed)
 Patient cleaned up by this RN and Powell RN after an episode of bowel incontinence. Patient placed in a clean brief and clean paper pads. Patient returned to a comfortable position. Bed in lowest position with call light in reach.

## 2023-11-11 ENCOUNTER — Other Ambulatory Visit: Payer: Self-pay

## 2023-11-11 DIAGNOSIS — N390 Urinary tract infection, site not specified: Principal | ICD-10-CM

## 2023-11-11 DIAGNOSIS — R296 Repeated falls: Secondary | ICD-10-CM

## 2023-11-11 DIAGNOSIS — Z515 Encounter for palliative care: Secondary | ICD-10-CM | POA: Diagnosis not present

## 2023-11-11 DIAGNOSIS — A419 Sepsis, unspecified organism: Secondary | ICD-10-CM

## 2023-11-11 LAB — CLOSTRIDIUM DIFFICILE BY PCR, REFLEXED
Hypervirulent Strain: NEGATIVE
Toxigenic C. Difficile by PCR: POSITIVE — AB

## 2023-11-11 LAB — URINALYSIS, COMPLETE (UACMP) WITH MICROSCOPIC
Bilirubin Urine: NEGATIVE
Glucose, UA: NEGATIVE mg/dL
Ketones, ur: NEGATIVE mg/dL
Nitrite: NEGATIVE
Protein, ur: >=300 mg/dL — AB
RBC / HPF: >50 RBC/hpf (ref 0–5)
Specific Gravity, Urine: 1.012 (ref 1.005–1.030)
Squamous Epithelial / HPF: 0 /HPF (ref 0–5)
WBC, UA: >50 WBC/hpf (ref 0–5)
pH: 7 (ref 5.0–8.0)

## 2023-11-11 LAB — COMPREHENSIVE METABOLIC PANEL WITH GFR
ALT: 12 U/L (ref 0–44)
AST: 27 U/L (ref 15–41)
Albumin: 2.3 g/dL — ABNORMAL LOW (ref 3.5–5.0)
Alkaline Phosphatase: 61 U/L (ref 38–126)
Anion gap: 10 (ref 5–15)
BUN: 63 mg/dL — ABNORMAL HIGH (ref 8–23)
CO2: 19 mmol/L — ABNORMAL LOW (ref 22–32)
Calcium: 9.2 mg/dL (ref 8.9–10.3)
Chloride: 109 mmol/L (ref 98–111)
Creatinine, Ser: 1.56 mg/dL — ABNORMAL HIGH (ref 0.44–1.00)
GFR, Estimated: 31 mL/min — ABNORMAL LOW (ref >60)
Glucose, Bld: 83 mg/dL (ref 70–99)
Potassium: 4.5 mmol/L (ref 3.5–5.1)
Sodium: 138 mmol/L (ref 135–145)
Total Bilirubin: 0.6 mg/dL (ref 0.0–1.2)
Total Protein: 5.4 g/dL — ABNORMAL LOW (ref 6.5–8.1)

## 2023-11-11 LAB — CBC
HCT: 37.4 % (ref 36.0–46.0)
Hemoglobin: 12.4 g/dL (ref 12.0–15.0)
MCH: 32.4 pg (ref 26.0–34.0)
MCHC: 33.2 g/dL (ref 30.0–36.0)
MCV: 97.7 fL (ref 80.0–100.0)
Platelets: 237 K/uL (ref 150–400)
RBC: 3.83 MIL/uL — ABNORMAL LOW (ref 3.87–5.11)
RDW: 14 % (ref 11.5–15.5)
WBC: 18.7 K/uL — ABNORMAL HIGH (ref 4.0–10.5)
nRBC: 0 % (ref 0.0–0.2)

## 2023-11-11 LAB — LACTIC ACID, PLASMA
Lactic Acid, Venous: 2.3 mmol/L (ref 0.5–1.9)
Lactic Acid, Venous: 2.1 mmol/L (ref 0.5–1.9)

## 2023-11-11 LAB — URINE CULTURE

## 2023-11-11 LAB — VANCOMYCIN, RANDOM: Vancomycin Rm: 10 ug/mL

## 2023-11-11 LAB — C DIFFICILE QUICK SCREEN W PCR REFLEX
C Diff antigen: POSITIVE — AB
C Diff toxin: NEGATIVE

## 2023-11-11 MED ORDER — CEFUROXIME AXETIL 250 MG PO TABS
250.0000 mg | ORAL_TABLET | Freq: Two times a day (BID) | ORAL | 0 refills | Status: AC
  Filled 2023-11-11: qty 10, 5d supply, fill #0

## 2023-11-11 MED ORDER — LORAZEPAM 0.5 MG PO TABS
0.5000 mg | ORAL_TABLET | ORAL | 0 refills | Status: AC | PRN
  Filled 2023-11-11: qty 18, 3d supply, fill #0

## 2023-11-11 NOTE — IPAL (Signed)
  Interdisciplinary Goals of Care Family Meeting   Date carried out: 11/11/2023  Location of the meeting: Phone conference  Member's involved: Physician and Family Member or next of kin  Durable Power of Attorney or acting medical decision maker: Son/Dawn  Discussion: We discussed goals of care for [employer]   Code status:   Code Status: Limited: Do not attempt resuscitation (DNR) -DNR-LIMITED -Do Not Intubate/DNI    Disposition: Home with Hospice  Time spent for the meeting: 35 minutes    Cresencio Fairly, MD  11/11/2023, 3:09 PM

## 2023-11-11 NOTE — TOC Initial Note (Signed)
 Transition of Care [facility]) - Initial/Assessment Note    Patient Details  Name: Jane Perez MRN: [id number] Date of Birth: 11-09-1930  Transition of Care [facility]) CM/SW Contact:    Alfonso Rummer, LCSW Phone Number: 11/11/2023, 4:36 PM  Clinical Narrative:   Completed code 8 with patient in room 113. Spk with son Mr. Merlynn to advise of code 33. Pt will [facility] back [facility] with Hospice.                  Patient Goals and CMS Choice            Expected Discharge Plan and Services         Expected Discharge Date: 11/11/23                        [facility]             Prior Living Arrangements/Services                       Activities of Daily Living   ADL Screening (condition at time of admission) Independently performs ADLs?: No Does the patient have a NEW difficulty with bathing/dressing/toileting/self-feeding that is expected to last >3 days?: No Does the patient have a NEW difficulty with getting in/out of bed, walking, or climbing stairs that is expected to last >3 days?: No Does the patient have a NEW difficulty with communication that is expected to last >3 days?: No Is the patient deaf or have difficulty hearing?: No Does the patient have difficulty seeing, even when wearing glasses/contacts?: No Does the patient have difficulty concentrating, remembering, or making decisions?: Yes  Permission Sought/Granted                  Emotional Assessment              Admission diagnosis:  UTI (urinary tract infection) [N39.0] Weakness [R53.1] Healthcare-associated pneumonia [J18.9] Nausea and vomiting, unspecified vomiting type [R11.2] Patient Active Problem List   Diagnosis Date Noted   Hospice care patient 11/11/2023   UTI (urinary tract infection) 11/10/2023   Acute sepsis (HCC) 01/22/2022   Viral pneumonia 01/20/2022   Acute respiratory failure with hypoxia (HCC) 01/18/2022   Closed nondisplaced fracture of right  clavicle 01/18/2022   COVID-19 01/18/2022   Fall 01/17/2022   Hypoxia 01/17/2022   Pneumonia due to COVID-19 virus 01/17/2022   History of DVT (deep vein thrombosis) 01/17/2022   Bladder cancer (HCC) 01/17/2022   Pneumonia 01/17/2022   Frequent falls 09/02/2021   Fracture of lumbar spine (HCC) 09/02/2021   Peripheral edema, weakness, concerning for CHF (congestive heart failure)  09/02/2021   History of bladder cancer 04/12/2015   Urinary tract infection 01/09/2015   Atrophic vaginitis 01/09/2015   PCP:  Sadie Manna, MD Pharmacy:   [facility] [place],  - [address] [place] [address] Phone: [phone] Fax: [phone]  [facility] [address] [place] [address] Phone: [phone] Fax: [phone]     Social Drivers of Health (SDOH) Social History: SDOH Screenings   Food Insecurity: No Food Insecurity (11/10/2023)  Housing: Low Risk  (11/10/2023)  Transportation Needs: No Transportation Needs (11/10/2023)  Utilities: Not At Risk (11/10/2023)  Social Connections: Patient Unable To Answer (11/10/2023)  Tobacco Use: Unknown (11/10/2023)  Recent Concern: Tobacco Use - Medium Risk (11/05/2023)   Received  from [facility]   SDOH Interventions:     Readmission Risk Interventions     No data to display

## 2023-11-11 NOTE — Discharge Summary (Signed)
 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   1.  Sepsis due to UTI present on admission - Patient has a positive urine analysis, urine culture growing mixed organism -She meets criteria for sepsis due to hypotension, tachycardia, lactic acidosis and leukocytosis.  Improving with treatment - She was given IV fluid, antibiotics and cultures were drawn in the emergency room. - She was treated with IV vancomycin  and aztreonam  in the emergency room as there is a penicillin allergy. - Later switched to IV ceftriaxone  and vancomycin   - Being discharged on oral cefuroxime    2.  AKI - Improving with hydration   3.  Acute change in mental status with underlying dementia - Likely due to sepsis due to UTI delirium   4.  History of DVT/PE - Continue on Eliquis    5.  Deconditioning   6.  Hypertension now hypotension - Hold off few blood pressure medicines  Goals of care Son/Dan/POA would like hospice at [facility]/long-term care           Disposition: Hospice care Diet recommendation:  Discharge Diet Orders (From admission, onward)     Start     Ordered   11/11/23 0000  Diet - low sodium heart healthy        11/11/23 1507           Carb modified diet DISCHARGE MEDICATION: Allergies as of 11/11/2023       Reactions   Aspirin Other (See Comments)    Will cause hemerage    Ciprofloxacin Hives   Codeine Other (See Comments)   Other Reaction: GI Upset   Metronidazole Hives   Penicillins Shortness Of Breath   Sulfa  Antibiotics Shortness Of Breath   Clindamycin Hcl Other (See Comments)   Nitrofurantoin Monohyd Macro Other (See Comments), Nausea Only   Other Other (See Comments)   Uncoded Allergy. Allergen: flu shot, Other Reaction: Other reaction   Povidone Iodine Other (See Comments)   Other Reaction: Other reaction        Medication List     STOP taking these medications    albuterol  108 (90 Base) MCG/ACT inhaler Commonly known as: VENTOLIN  HFA   bisacodyl  10 MG suppository Commonly known as: DULCOLAX   furosemide  20 MG tablet Commonly known as: LASIX    megestrol  40 MG tablet Commonly known as: MEGACE    nitrofurantoin (macrocrystal-monohydrate) 100 MG capsule Commonly known as: MACROBID   polyethylene glycol 17 g packet Commonly known as: MIRALAX  / GLYCOLAX    traZODone  50 MG tablet Commonly known as: DESYREL    Vitamin E 180 MG (400 UNIT) Caps       TAKE these medications    acetaminophen  500 MG tablet Commonly known as: TYLENOL  Take 500 mg by mouth in the morning, at noon, and at bedtime.   Acidophilus/Pectin Caps Take 1 capsule by mouth daily.   apixaban  5 MG Tabs  tablet Commonly known as: ELIQUIS  Take 5 mg by mouth 2 (two) times daily.   cefUROXime  250 MG tablet Commonly known as: CEFTIN  Take 1 tablet (250 mg total) by mouth 2 (two) times daily with a meal for 5 days.   Cranberry 450 MG Tabs Take 500 mg by mouth daily.   cyanocobalamin  1000 MCG tablet Commonly known as: VITAMIN B12 Take 1,000 mcg by mouth daily.   gabapentin  100 MG capsule Commonly known as: NEURONTIN  Take 100 mg by mouth at bedtime.   LORazepam  0.5 MG tablet Commonly known as: ATIVAN  Take 1 tablet (0.5 mg total) by mouth every 4 (four) hours as needed for up to 3 days for anxiety. May crush, mix with water and give  sublingually if needed.   Multi-Vitamins Tabs Take 1 tablet by mouth daily.   senna-docusate 8.6-50 MG tablet Commonly known as: Senokot-S Take 2 tablets by mouth at bedtime as needed for mild constipation or moderate constipation.   Vitamin D (Ergocalciferol) 1.25 MG (50000 UNIT) Caps capsule Commonly known as: DRISDOL Take 50,000 Units by mouth every Monday.        Follow-up Information     Jane Manna, MD. Schedule an appointment as soon as possible for a visit in 1 week(s).   Specialty: Internal Medicine Why: [facility] Discharge F/UP Contact information: [address] [place] [address] [phone]                Discharge Exam: Jane Perez   11/10/23 1216 11/10/23 1700  Weight: 55.3 kg 52.1 kg   Constitutional: Alert, awake, calm, comfortable HEENT: Neck supple Respiratory: Clear to auscultation B/L, no wheezing, no rales.  Cardiovascular: Regular rate and rhythm, no murmurs / rubs / gallops. No extremity edema.  Abdomen: Soft, benign Musculoskeletal: no clubbing / cyanosis. Good ROM, no contractures. Normal muscle tone.  Skin: no rashes, lesions, ulcers. Neurologic: CN 2-12 grossly intact. Sensation intact, No focal deficit identified Psychiatric: Alert and awake. Normal mood.    Condition at discharge: fair  The results of significant diagnostics from this hospitalization (including imaging, microbiology, ancillary and laboratory) are listed below for reference.   Imaging Studies: DG Chest Port 1 View Result Date: 11/10/2023 CLINICAL DATA:  Sepsis EXAM: PORTABLE CHEST 1 VIEW COMPARISON:  June 29, 2023 FINDINGS: Limited examination due to overlying support devices and patient positioning. Patchy opacity at the left lung base, possibly representing atelectasis and/or infection. No pleural effusions. No pneumothorax. Cardiomediastinal silhouette is unchanged. Aortic calcifications. IMPRESSION: Limited  examination. Patchy opacity at the left lung base, possibly representing atelectasis and/or infection. Electronically Signed   By: Michaeline Blanch M.D.   On: 11/10/2023 12:26    Microbiology: Results for orders placed or performed during the hospital encounter of 11/10/23  Resp panel by RT-PCR (RSV, Flu A&B, Covid) Anterior Nasal Swab     Status: None   Collection Time: 11/10/23 12:15 PM   Specimen: Anterior Nasal Swab  Result Value Ref Range Status   SARS Coronavirus 2 by RT PCR NEGATIVE NEGATIVE Final    Comment: (NOTE) SARS-CoV-2 target nucleic acids are NOT DETECTED.  The SARS-CoV-2 RNA is generally detectable in upper respiratory specimens during the acute phase of infection. The lowest concentration of SARS-CoV-2 viral copies this assay can detect is 138 copies/mL. A negative result does not preclude SARS-Cov-2 infection and should not be used as the sole basis for treatment or other patient management decisions. A negative result may occur with  improper specimen  collection/handling, submission of specimen other than nasopharyngeal swab, presence of viral mutation(s) within the areas targeted by this assay, and inadequate number of viral copies(<138 copies/mL). A negative result must be combined with clinical observations, patient history, and epidemiological information. The expected result is Negative.  Fact Sheet for Patients:  BloggerCourse.com  Fact Sheet for Healthcare Providers:  SeriousBroker.it  This test is no t yet approved or cleared by the [place]  FDA and  has been authorized for detection and/or diagnosis of SARS-CoV-2 by FDA under an Emergency Use Authorization (EUA). This EUA will remain  in effect (meaning this test can be used) for the duration of the COVID-19 declaration under Section 564(b)(1) of the Act, 21 U.S.C.section 360bbb-3(b)(1), unless the authorization is terminated  or revoked sooner.        Influenza A by PCR NEGATIVE NEGATIVE Final   Influenza B by PCR NEGATIVE NEGATIVE Final    Comment: (NOTE) The Xpert Xpress SARS-CoV-2/FLU/RSV plus assay is intended as an aid in the diagnosis of influenza from Nasopharyngeal swab specimens and should not be used as a sole basis for treatment. Nasal washings and aspirates are unacceptable for Xpert Xpress SARS-CoV-2/FLU/RSV testing.  Fact Sheet for Patients: BloggerCourse.com  Fact Sheet for Healthcare Providers: SeriousBroker.it  This test is not yet approved or cleared by the [place]  FDA and has been authorized for detection and/or diagnosis of SARS-CoV-2 by FDA under an Emergency Use Authorization (EUA). This EUA will remain in effect (meaning this test can be used) for the duration of the COVID-19 declaration under Section 564(b)(1) of the Act, 21 U.S.C. section 360bbb-3(b)(1), unless the authorization is terminated or revoked.     Resp Syncytial Virus by PCR NEGATIVE NEGATIVE Final    Comment: (NOTE) Fact Sheet for Patients: BloggerCourse.com  Fact Sheet for Healthcare Providers: SeriousBroker.it  This test is not yet approved or cleared by the [place]  FDA and has been authorized for detection and/or diagnosis of SARS-CoV-2 by FDA under an Emergency Use Authorization (EUA). This EUA will remain in effect (meaning this test can be used) for the duration of the COVID-19 declaration under Section 564(b)(1) of the Act, 21 U.S.C. section 360bbb-3(b)(1), unless the authorization is terminated or revoked.  Performed at [facility], [address] [place], [place] [address]   Blood Culture (routine x 2)     Status: None (Preliminary result)   Collection Time: 11/10/23 12:40 PM   Specimen: BLOOD  Result Value Ref Range Status   Specimen Description BLOOD BLOOD LEFT HAND  Final   Special  Requests   Final    BOTTLES DRAWN AEROBIC ONLY Blood Culture results may not be optimal due to an inadequate volume of blood received in culture bottles   Culture   Final    NO GROWTH < 24 HOURS Performed at [facility], [address], [place], [place] [address]    Report Status PENDING  Incomplete  Blood Culture (routine x 2)     Status: None (Preliminary result)   Collection Time: 11/10/23  2:50 PM   Specimen: BLOOD  Result Value Ref Range Status   Specimen Description BLOOD BLOOD RIGHT ARM  Final   Special Requests   Final    BOTTLES DRAWN AEROBIC ONLY Blood Culture results may not be optimal due to an inadequate volume of blood received in culture bottles   Culture   Final    NO GROWTH < 24 HOURS Performed at [facility], [address] [place],  [place] [address]    Report Status PENDING  Incomplete  Urine Culture     Status: Abnormal   Collection Time: 11/10/23  3:41 PM   Specimen: Urine, Random  Result Value Ref Range Status   Specimen Description   Final    URINE, RANDOM Performed at [facility], [address], [place], [place] [address]    Special Requests   Final    NONE Reflexed from [id number] Performed at [facility], [address] [place], [place] [address]    Culture MULTIPLE SPECIES PRESENT, SUGGEST RECOLLECTION (A)  Final   Report Status 11/11/2023 FINAL  Final  C Difficile Quick Screen w PCR reflex     Status: Abnormal   Collection Time: 11/11/23 11:58 AM   Specimen: STOOL  Result Value Ref Range Status   C Diff antigen POSITIVE (A) NEGATIVE Final   C Diff toxin NEGATIVE NEGATIVE Final   C Diff interpretation Results are indeterminate. See PCR results.  Final    Comment: Performed at [facility], [address] [place], [place] [address]    Labs: CBC: Recent Labs  Lab 11/10/23 1450 11/11/23 0410  WBC 24.5* 18.7*  NEUTROABS 21.4*  --   HGB 13.7 12.4  HCT 41.4 37.4  MCV 98.1 97.7  PLT 248 237    Basic Metabolic Panel: Recent Labs  Lab 11/10/23 1450 11/11/23 0410  NA 132* 138  K 4.3 4.5  CL 100 109  CO2 16* 19*  GLUCOSE 192* 83  BUN 62* 63*  CREATININE 1.72* 1.56*  CALCIUM 9.5 9.2   Liver Function Tests: Recent Labs  Lab 11/10/23 1450 11/11/23 0410  AST 38 27  ALT 13 12  ALKPHOS 69 61  BILITOT 0.8 0.6  PROT 6.1* 5.4*  ALBUMIN 2.5* 2.3*   CBG: No results for input(s): GLUCAP in the last 168 hours.  Discharge time spent: greater than 30 minutes.  Signed: Cresencio Fairly, MD [facility] 11/11/2023

## 2023-11-11 NOTE — Progress Notes (Addendum)
 [facility] Liaison Note  Received request from [facility] for hospice services at [facility] after discharge. Spoke with patient's son, Todd to initiate education related to hospice philosophy, services and team approach to care. Son verbalized understanding of information given. Per discussion, the plan is for discharge to facility today.   DME needs discussed. Patient has the following equipment in the home: none Family requests the following equipment for delivery: admissions nurse to assess for needs  Please send signed and completed DNR home with patient/family. Please provide prescriptions at discharge as needed to ensure ongoing symptom management.  [facility] information and contact numbers given to [facility]. Please call with any concerns.  Thank you for the opportunity to participate in this patient's care.   Eleanor Nail, LPN [facility] Liaison [phone]

## 2023-11-15 LAB — CULTURE, BLOOD (ROUTINE X 2)
Culture: NO GROWTH
Culture: NO GROWTH

## 2024-01-10 DEATH — deceased
